# Patient Record
Sex: Male | Born: 1960 | Race: White | Hispanic: No | Marital: Married | Smoking: Current some day smoker
Health system: Southern US, Community
[De-identification: ages and names within clinical notes are randomized; demographics above are authoritative.]

## PROBLEM LIST (undated history)

## (undated) DIAGNOSIS — M1990 Unspecified osteoarthritis, unspecified site: Secondary | ICD-10-CM

## (undated) DIAGNOSIS — R200 Anesthesia of skin: Secondary | ICD-10-CM

## (undated) DIAGNOSIS — K219 Gastro-esophageal reflux disease without esophagitis: Secondary | ICD-10-CM

## (undated) DIAGNOSIS — I2699 Other pulmonary embolism without acute cor pulmonale: Secondary | ICD-10-CM

## (undated) DIAGNOSIS — R634 Abnormal weight loss: Secondary | ICD-10-CM

## (undated) DIAGNOSIS — K6289 Other specified diseases of anus and rectum: Secondary | ICD-10-CM

## (undated) DIAGNOSIS — G4730 Sleep apnea, unspecified: Secondary | ICD-10-CM

## (undated) DIAGNOSIS — M7989 Other specified soft tissue disorders: Secondary | ICD-10-CM

## (undated) DIAGNOSIS — I10 Essential (primary) hypertension: Secondary | ICD-10-CM

## (undated) DIAGNOSIS — E041 Nontoxic single thyroid nodule: Secondary | ICD-10-CM

## (undated) DIAGNOSIS — K859 Acute pancreatitis without necrosis or infection, unspecified: Principal | ICD-10-CM

## (undated) DIAGNOSIS — R51 Headache: Secondary | ICD-10-CM

## (undated) DIAGNOSIS — A0472 Enterocolitis due to Clostridium difficile, not specified as recurrent: Secondary | ICD-10-CM

## (undated) DIAGNOSIS — D649 Anemia, unspecified: Secondary | ICD-10-CM

## (undated) DIAGNOSIS — R238 Other skin changes: Secondary | ICD-10-CM

## (undated) DIAGNOSIS — R002 Palpitations: Secondary | ICD-10-CM

## (undated) DIAGNOSIS — D6851 Activated protein C resistance: Secondary | ICD-10-CM

## (undated) DIAGNOSIS — N411 Chronic prostatitis: Secondary | ICD-10-CM

## (undated) DIAGNOSIS — R233 Spontaneous ecchymoses: Secondary | ICD-10-CM

## (undated) DIAGNOSIS — R9431 Abnormal electrocardiogram [ECG] [EKG]: Secondary | ICD-10-CM

## (undated) DIAGNOSIS — N400 Enlarged prostate without lower urinary tract symptoms: Secondary | ICD-10-CM

## (undated) DIAGNOSIS — J189 Pneumonia, unspecified organism: Secondary | ICD-10-CM

## (undated) DIAGNOSIS — Z79899 Other long term (current) drug therapy: Secondary | ICD-10-CM

## (undated) DIAGNOSIS — R202 Paresthesia of skin: Secondary | ICD-10-CM

## (undated) HISTORY — DX: Nontoxic single thyroid nodule: E04.1

## (undated) HISTORY — DX: Enterocolitis due to Clostridium difficile, not specified as recurrent: A04.72

## (undated) HISTORY — DX: Acute pancreatitis without necrosis or infection, unspecified: K85.90

## (undated) HISTORY — DX: Abnormal weight loss: R63.4

## (undated) HISTORY — DX: Essential (primary) hypertension: I10

## (undated) HISTORY — DX: Other specified diseases of anus and rectum: K62.89

## (undated) HISTORY — DX: Other pulmonary embolism without acute cor pulmonale: I26.99

## (undated) HISTORY — DX: Headache: R51

## (undated) HISTORY — DX: Gastro-esophageal reflux disease without esophagitis: K21.9

## (undated) HISTORY — DX: Unspecified osteoarthritis, unspecified site: M19.90

## (undated) HISTORY — DX: Activated protein C resistance: D68.51

## (undated) HISTORY — DX: Spontaneous ecchymoses: R23.3

## (undated) HISTORY — DX: Other skin changes: R23.8

## (undated) HISTORY — DX: Other long term (current) drug therapy: Z79.899

## (undated) HISTORY — DX: Chronic prostatitis: N41.1

## (undated) HISTORY — DX: Other specified soft tissue disorders: M79.89

## (undated) HISTORY — DX: Benign prostatic hyperplasia without lower urinary tract symptoms: N40.0

## (undated) HISTORY — PX: FEMUR FRACTURE SURGERY: SHX633

## (undated) HISTORY — DX: Palpitations: R00.2

---

## 1979-07-02 HISTORY — PX: NERVE SURGERY: SHX1016

## 1979-07-02 HISTORY — PX: KNEE SURGERY: SHX244

## 1997-10-31 HISTORY — PX: NECK SURGERY: SHX720

## 2006-12-25 ENCOUNTER — Emergency Department (HOSPITAL_COMMUNITY): Admission: EM | Admit: 2006-12-25 | Discharge: 2006-12-25 | Payer: Self-pay | Admitting: Emergency Medicine

## 2010-12-30 DIAGNOSIS — D6851 Activated protein C resistance: Secondary | ICD-10-CM

## 2010-12-30 DIAGNOSIS — I2699 Other pulmonary embolism without acute cor pulmonale: Secondary | ICD-10-CM

## 2010-12-30 HISTORY — DX: Activated protein C resistance: D68.51

## 2010-12-30 HISTORY — DX: Other pulmonary embolism without acute cor pulmonale: I26.99

## 2011-01-30 HISTORY — PX: ESOPHAGOGASTRODUODENOSCOPY: SHX1529

## 2011-01-30 HISTORY — PX: COLONOSCOPY: SHX174

## 2011-04-06 ENCOUNTER — Encounter: Payer: Self-pay | Admitting: Internal Medicine

## 2011-04-18 ENCOUNTER — Encounter: Payer: Self-pay | Admitting: Gastroenterology

## 2011-04-18 ENCOUNTER — Ambulatory Visit (INDEPENDENT_AMBULATORY_CARE_PROVIDER_SITE_OTHER): Payer: Self-pay | Admitting: Gastroenterology

## 2011-04-18 VITALS — BP 142/88 | HR 92 | Temp 98.4°F | Ht 72.0 in | Wt 176.8 lb

## 2011-04-18 DIAGNOSIS — K5900 Constipation, unspecified: Secondary | ICD-10-CM | POA: Insufficient documentation

## 2011-04-18 DIAGNOSIS — R109 Unspecified abdominal pain: Secondary | ICD-10-CM | POA: Insufficient documentation

## 2011-04-18 DIAGNOSIS — K219 Gastro-esophageal reflux disease without esophagitis: Secondary | ICD-10-CM | POA: Insufficient documentation

## 2011-04-18 DIAGNOSIS — R1310 Dysphagia, unspecified: Secondary | ICD-10-CM | POA: Insufficient documentation

## 2011-04-18 DIAGNOSIS — K625 Hemorrhage of anus and rectum: Secondary | ICD-10-CM

## 2011-04-18 DIAGNOSIS — G8929 Other chronic pain: Secondary | ICD-10-CM

## 2011-04-18 MED ORDER — ESOMEPRAZOLE MAGNESIUM 40 MG PO CPDR: 40.0000 mg | DELAYED_RELEASE_CAPSULE | Freq: Every day | ORAL | Status: DC

## 2011-04-18 NOTE — Progress Notes (Signed)
Cc to PCP 

## 2011-04-18 NOTE — Assessment & Plan Note (Signed)
No recent rectal bleeding. History of hemorrhoids on colonoscopy in April. No evidence of significant anemia. EGD and remarkable as well.Monitor for now.

## 2011-04-18 NOTE — Progress Notes (Signed)
Primary Care Physician:  [name], MD  Primary Gastroenterologist:  Roetta Sessions, MD  Chief Complaint  Patient presents with  . Gastrophageal Reflux  . Abdominal Pain    HPI:  Alex Hoffman is a 50 y.o. male here for further evaluation of GERD, abdominal pain. His history is very complicated. Essentially he complains of chronic abdominal pain for over one year. He states he went to [facility] hospital about one year ago and had a CT. Nothing to explain his abdominal pain. See records below. He complains of intermittent severe abdominal pain associated with vomiting. He also complains of rectal bleeding of the same length of duration. Rectal bleeding about one year ago and back in 12/2010 while in hospital for PE. He states he was told he had acute prostatitis and has been on Cipro for 6 weeks without any improvement of his lower abdominal pain, dysuria or pain he has with bowel movements. Stomach stays swollen all the time. Pain with BM, ache in groin and lower abdominal. Due to lack of insurance he is has a pending urology Appt at [facility] (patient assistance program).  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 He presented with swelling and pain of the left arm and left leg. He was also having some diarrhea was diagnosed with C. difficile and started on Flagyl. EGD by Dr. Cristy Folks was unremarkable. Colonoscopy unremarkable. No evidence of C. difficile on exam. Patient had positive C. difficile PCR and was treated with Flagyl.  Bad H/As, debilitating, waiting to see neurologist at [facility]. No insurance and they will provide financial assistance.  Not able to drive since 03/2129.   On Cipro for prostatitis.  Also complains of waking up in the middle of the night choking. Sometimes he feels like he forgets how to swallow. He had a modified barium swallow study as outlined below.   Records from [facility]: 1. MRI/MRA of the head 03/08/2011  showed small left maxillary sinus mucous retention cyst and mild maxillary sinus mucosal thickening but otherwise normal appearance of the brain. Normal intracranial MRV. 2. MRI of the cervical spine on 03/08/2011 showed postoperative changes with C5-C6 fusion, mild bulging annulus and a shallow disc protrusion at C4-C5 without significant neural compression. 3. CT of the head without contrast on 03/08/2011 was unremarkable. 4. Labs from May 2012 hospitalization, glucose 111, BUN 10, creatinine 0.84, sodium 140, potassium 3.4, calcium 9.1, white blood cell count 6500, hemoglobin 13.2, hematocrit 30.3, MCV 96.2, platelets 306,000. 5. CT angiography of the chest in March 2012 showed bilateral pulmonary emboli with relatively mild clot burden. 6. EGD by Dr. Mikal Plane on 02/01/2011 showed bile reflux in the stomach but otherwise unremarkable. 7. colonoscopy by Dr. Mikal Plane on 02/04/2011 showed internal hemorrhoids but no evidence of pseudomembranous colitis. He had positive C. difficile PCR. 8. modified barium swallow study done on 04/05/2011 was unremarkable. Given patient's description they were concerned about possibility of LPR.  CT pelvis with contrast from July 2011 showed 1.5 x 2.5 cm vague area of decreased attenuation within the inferior right liver.  Current Outpatient Prescriptions  Medication Sig Dispense Refill  . amLODipine (NORVASC) 10 MG tablet Take 10 mg by mouth daily.        . ciprofloxacin (CIPRO) 500 MG tablet Take 500 mg by mouth 2 (two) times daily.        . nicotine (NICODERM CQ - DOSED IN MG/24 HOURS) 21 mg/24hr patch Place  1 patch onto the skin daily.        Marland Kitchen tiZANidine (ZANAFLEX) 4 MG capsule Take 4 mg by mouth 3 (three) times daily.        Marland Kitchen warfarin (COUMADIN) 7.5 MG tablet Take 7.5 mg by mouth daily. Takes 1/2 pill one day then 1 whole pill then next day       . DISCONTD: omeprazole (PRILOSEC OTC) 20 MG tablet Take 20 mg by mouth 2 (two) times daily.        Marland Kitchen  esomeprazole (NEXIUM) 40 MG capsule Take 1 capsule (40 mg total) by mouth daily before breakfast.  20 capsule  0    Allergies as of 04/18/2011  . (No Known Allergies)    Past Medical History  Diagnosis Date  . Pulmonary embolism   . Cystic thyroid nodule     left  . HTN (hypertension)   . Heterozygous factor V Leiden mutation     Past Surgical History  Procedure Date  . Neck surgery     C5/C6 fusion  . Knee surgery     arthroscopy, left knee  . Femur fracture surgery   . Nerve surgery     right thumb    Family History  Problem Relation Age of Onset  . GI problems Brother   . Colon cancer Neg Hx   . Liver disease Neg Hx   . GI problems      stomach and lung cancer, aunts/uncles  . Heart attack Brother     before age of 56  . Stroke Brother     History   Social History  . Marital Status: Divorced    Spouse Name: N/A    Number of Children: 2  . Years of Education: N/A   Occupational History  .      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 Smoking status: Former Smoker -- 1.0 packs/day    Types: Cigarettes    Quit date: 01/27/2011  . Smokeless tobacco: Not on file   Comment: on the patch now  . Alcohol Use: No  . Drug Use: No  . Sexually Active: Not on file   Other Topics Concern  . Not on file   Social History Narrative  . No narrative on file      ROS:  General: Negative for anorexia, fever, chills. Complains of 30 pound weight loss, fatigue, weakness. Eyes: Negative for vision changes.  ENT: Negative for hoarseness, difficulty swallowing , nasal congestion. CV: Negative for chest pain, angina, palpitations, dyspnea on exertion, peripheral edema.  Respiratory: Negative for dyspnea at rest, dyspnea on exertion, cough, sputum, wheezing.  GI: See history of present illness. GU: Complains of dysuria. Negative for hematuria, urinary incontinence, urinary frequency, nocturnal  urination.  MS: Negative for joint pain, low back pain.  Derm: Negative for rash or itching.  Neuro: Negative for weakness, abnormal sensation, seizure, memory loss, confusion. Complains of frequent headache.  Psych: Negative for anxiety, depression, suicidal ideation, hallucinations.  Endo: Negative for unusual weight change.  Heme: Negative for bruising or bleeding. Allergy: Negative for rash or hives.    Physical Examination:  BP 142/88  Pulse 92  Temp(Src) 98.4 F (36.9 C) (Temporal)  Ht 6' (1.829 m)  Wt 176 lb 12.8 oz (80.196 kg)  BMI 23.98 kg/m2   General: Well-nourished, well-developed in no acute distress.  Head: Normocephalic, atraumatic.  Eyes: Conjunctiva pink, no icterus. Mouth: Oropharyngeal mucosa moist and pink , no lesions erythema or exudate. Neck: Supple without thyromegaly, masses, or lymphadenopathy.  Lungs: Clear to auscultation bilaterally.  Heart: Regular rate and rhythm, no murmurs rubs or gallops.  Abdomen: Bowel sounds are normal, nondistended, no hepatosplenomegaly or masses, no abdominal bruits or hernia. Diffuse abdominal tenderness to deep palpation with subjective guarding but no rebound.   Extremities: No lower extremity edema.  Neuro: Alert and oriented x 4 , grossly normal neurologically.  Skin: Warm and dry, no rash or jaundice.   Psych: Alert and cooperative, normal mood and affect.

## 2011-04-18 NOTE — Assessment & Plan Note (Signed)
Diffuse abdominal pain, chronic in nature and associated with vomiting and rectal bleeding at times. C. difficile PCR was positive and patient took Flagyl although colonoscopy was unremarkable Dr. Cristy Folks. He has a history of heterozygous factor V mutation and is now on anticoagulation. He is quite tender on exam today. Proceed with CT the abdomen pelvis with IV and oral contrast. I do not see anywhere where he had his liver function test done. We'll check those. Also check CBC and lipase.

## 2011-04-18 NOTE — Assessment & Plan Note (Signed)
Chronic abdominal pain associated with intermittent vomiting. Complains of waking up in the middle of the night choking. Complains of difficulty remembering how to swallow. Recent modified barium swallow was unremarkable. Really does not describe typical esophageal dysphagia. He does not appreciating any improvement on omeprazole 20 mg daily. This may be atypical presentation of GERD. Trial of Nexium 40 mg daily. #20 samples provided. EGD by Dr. Cristy Folks in April was unremarkable.

## 2011-04-18 NOTE — Progress Notes (Signed)
PT is scheduled for CT on 04/20/11- He is aware to have lab work prior

## 2011-04-18 NOTE — Assessment & Plan Note (Signed)
Refer to GERD assessment and plan.

## 2011-04-20 ENCOUNTER — Other Ambulatory Visit (HOSPITAL_COMMUNITY): Payer: Self-pay

## 2011-04-25 ENCOUNTER — Other Ambulatory Visit (HOSPITAL_COMMUNITY): Payer: Self-pay

## 2011-04-25 ENCOUNTER — Telehealth: Payer: Self-pay

## 2011-04-25 LAB — COMPREHENSIVE METABOLIC PANEL
AST: 56 U/L — ABNORMAL HIGH (ref 0–37)
CO2: 29 mEq/L (ref 19–32)
Creat: 0.64 mg/dL (ref 0.50–1.35)
Total Bilirubin: 1.4 mg/dL — ABNORMAL HIGH (ref 0.3–1.2)
Total Protein: 6.8 g/dL (ref 6.0–8.3)

## 2011-04-25 LAB — CBC WITH DIFFERENTIAL/PLATELET
Basophils Absolute: 0 10*3/uL (ref 0.0–0.1)
HCT: 36.9 % — ABNORMAL LOW (ref 39.0–52.0)
Hemoglobin: 12.5 g/dL — ABNORMAL LOW (ref 13.0–17.0)
Lymphocytes Relative: 34 % (ref 12–46)
Monocytes Absolute: 0.6 10*3/uL (ref 0.1–1.0)
Monocytes Relative: 9 % (ref 3–12)
Platelets: 379 10*3/uL (ref 150–400)
RBC: 3.93 MIL/uL — ABNORMAL LOW (ref 4.22–5.81)

## 2011-04-25 NOTE — Telephone Encounter (Signed)
Pt called- he had his blood work done this morning and would like to be rescheduled for his ct.

## 2011-04-26 NOTE — Telephone Encounter (Signed)
Pt scheduled for ct scan on 04/28/2011@2 :30 with an arrival time at 12:00noon time. He will drink his contrast at [facility].

## 2011-04-28 ENCOUNTER — Inpatient Hospital Stay (HOSPITAL_COMMUNITY): Admission: RE | Admit: 2011-04-28 | Payer: Self-pay | Source: Ambulatory Visit

## 2011-05-01 DIAGNOSIS — K859 Acute pancreatitis without necrosis or infection, unspecified: Secondary | ICD-10-CM

## 2011-05-01 HISTORY — DX: Acute pancreatitis without necrosis or infection, unspecified: K85.90

## 2011-05-02 ENCOUNTER — Ambulatory Visit (HOSPITAL_COMMUNITY)
Admission: RE | Admit: 2011-05-02 | Discharge: 2011-05-02 | Disposition: A | Discharge status: Home / Self Care | Payer: Self-pay | Source: Ambulatory Visit | Attending: Gastroenterology | Admitting: Gastroenterology

## 2011-05-02 DIAGNOSIS — K625 Hemorrhage of anus and rectum: Secondary | ICD-10-CM

## 2011-05-02 DIAGNOSIS — I10 Essential (primary) hypertension: Secondary | ICD-10-CM | POA: Insufficient documentation

## 2011-05-02 DIAGNOSIS — R109 Unspecified abdominal pain: Secondary | ICD-10-CM | POA: Insufficient documentation

## 2011-05-02 MED ORDER — IOHEXOL 300 MG/ML  SOLN
100.0000 mL | Freq: Once | INTRAMUSCULAR | Status: AC | PRN
  Administered 2011-05-02: 100 mL via INTRAVENOUS

## 2011-05-02 NOTE — Progress Notes (Signed)
Pt was rescheduled for today- 05/02/11

## 2011-05-05 ENCOUNTER — Other Ambulatory Visit: Payer: Self-pay | Admitting: Gastroenterology

## 2011-05-09 ENCOUNTER — Encounter: Payer: Self-pay | Admitting: General Practice

## 2011-05-09 NOTE — Progress Notes (Signed)
We received a phone call from Dr Orvilla Cornwall office.  They wanted to inform us that the pt is an inpatient at [facility] in [place].  They will have them send correspondents to our office.

## 2011-05-11 ENCOUNTER — Telehealth: Payer: Self-pay

## 2011-05-11 NOTE — Telephone Encounter (Signed)
We don't have the authority to do that. He needs to discuss with his attending physician at [facility].

## 2011-05-11 NOTE — Telephone Encounter (Signed)
pts wife called. Pt is inpatient at [facility] and wants to know if there is anyway we can get him transferred to [facility].

## 2011-05-12 NOTE — Telephone Encounter (Signed)
Pt aware, he stated he thought they were considering transferring him to [facility].

## 2011-05-16 NOTE — Progress Notes (Signed)
Records are on your desk

## 2011-05-17 NOTE — Progress Notes (Signed)
Patient called about trying to get help with medication (Nexium 40 mg QD). I told him we would give sample until the nurse came back in to fill out paper work. Left 2 boxes of Nexium 40 mg samples up front for him to pick up on Wednesday.

## 2011-05-19 ENCOUNTER — Telehealth: Payer: Self-pay | Admitting: Gastroenterology

## 2011-05-19 ENCOUNTER — Encounter: Payer: Self-pay | Admitting: Gastroenterology

## 2011-05-19 NOTE — Telephone Encounter (Signed)
Patient's wife called and said Dr.Karb would like for [facility] or Dr.Rourk to call him to talk about what they need to do about the test. His number is [phone].

## 2011-05-19 NOTE — Progress Notes (Signed)
Records received

## 2011-05-19 NOTE — Progress Notes (Signed)
  Received records from [facility]. Reviewed below.  1. Admission from July 6 through 05/13/2011 for acute pancreatitis.  2. Lipase on admission was 301. Total bilirubin 1.4, alkaline phosphatase 138, AST 22, ALT 20, amylase 229. On July 12 alkaline phosphatase was 139, AST 34, ALT 17, total bilirubin 0.5, amylase 51, lipase 60.  3. Hepatobiliary scan, 05/12/2011, demonstration of patency of the common bile duct and the cystic duct. No evidence of acute cholecystitis. There was delayed visualization of the gallbladder consistent with element of chronic cholecystitis.  4. MRCP, 05/09/2011, gallbladder minimally distended at 4 cm. Common bile duct mildly dilated at 7 mm. Small fluid collection anterior to the pancreas measuring 1.7 x 0.9 cm, 3 filling defects which measure approximately 4 mm within the distal pancreatic duct felt to likely represent concretions collecting in the distal duct pancreatic duct from chronic pancreatitis. No evidence of stones within the common bile duct. No evidence of acute cholecystitis.  5. Abdominal ultrasound from 05/08/2011 showed common bile duct diameter of 9.7 mm, distended gallbladder without gallstones or wall thickening. Positive sonographic Murphy's sign.  6. Discharge medications including methadone 10 mg twice a day, Valium 5 mg each bedtime, oxycodone 15 mg 4 times a day, Coumadin, Norvasc, Zanaflex, nicotine patch  7. He is scheduled to have an endoscopic ultrasound at [facility] on October 29 at 1 PM.  8. Seen by Dr. Josephina Shih in consultation, flexible fiberoptic laryngoscopy was performed and was normal.  Patient to be scheduled for followup to discuss next step in workup.

## 2011-05-19 NOTE — Telephone Encounter (Signed)
APPT SCED FOR 05/24/11 AT 9:00 W/LSL

## 2011-05-19 NOTE — Telephone Encounter (Signed)
Patient needs followup office visit to discuss further management. Recent hospitalization for pancreatitis at [facility]. Please schedule within the next one to 2 weeks.

## 2011-05-19 NOTE — Telephone Encounter (Signed)
Spoke with Dr. Cleone Slim. Will review MRCP films and findings with Dr. Jena Gauss and radiologist tomorrow. Will call Dr. Cleone Slim on Monday as discussed, to address patient's plan of care.

## 2011-05-20 NOTE — Telephone Encounter (Signed)
Reviewed MRCP films with Dr. Tyron Russell. Three small ?stones in distal pancreatic duct proximal to ampulla. No CBD stones. Discussed with Dr. Jena Gauss. As noted by him, I will talk with Dr. Cleone Slim on Monday and discuss that patient needs EUS before consideration of any potential ERCP. If ERCP needed for pancreatic duct abnormalities, this will need to be done by a pancreatic expert due to risk of serious complications.

## 2011-05-20 NOTE — Telephone Encounter (Signed)
Patient needs an endoscopic ultrasound before consideration of any potential ERCP. If ERCP were to be needed for pancreatic duct abnormalities, this would need to be done by a pancreatic expert at a tertiary referral center.

## 2011-05-23 ENCOUNTER — Telehealth: Payer: Self-pay

## 2011-05-23 DIAGNOSIS — R933 Abnormal findings on diagnostic imaging of other parts of digestive tract: Secondary | ICD-10-CM

## 2011-05-23 LAB — COMPREHENSIVE METABOLIC PANEL
Hemoglobin: 12.4 g/dL — AB (ref 13.5–17.5)
MCV: 95.9 fL
WBC: 7
platelet count: 595

## 2011-05-23 LAB — BASIC METABOLIC PANEL
Amylase: 39 units/L (ref 25–110)
BUN: 9 mg/dL (ref 4–21)
Creat: 0.92
Total Bilirubin: 0.5 mg/dL

## 2011-05-23 NOTE — Telephone Encounter (Signed)
Spoke with Dr. Cleone Slim. He will be seeing the patient this afternoon.   Dr. Cleone Slim going to send patient to Dr. Wendall Papa for EUS.  Patient has appointment here tomorrow, unclear if he plans to keep.

## 2011-05-24 ENCOUNTER — Ambulatory Visit (INDEPENDENT_AMBULATORY_CARE_PROVIDER_SITE_OTHER): Payer: Self-pay | Admitting: Gastroenterology

## 2011-05-24 ENCOUNTER — Encounter: Payer: Self-pay | Admitting: Gastroenterology

## 2011-05-24 ENCOUNTER — Other Ambulatory Visit: Payer: Self-pay | Admitting: Gastroenterology

## 2011-05-24 VITALS — BP 115/75 | HR 66 | Temp 97.4°F | Ht 72.0 in | Wt 172.0 lb

## 2011-05-24 DIAGNOSIS — K859 Acute pancreatitis without necrosis or infection, unspecified: Secondary | ICD-10-CM | POA: Insufficient documentation

## 2011-05-24 DIAGNOSIS — R1310 Dysphagia, unspecified: Secondary | ICD-10-CM

## 2011-05-24 DIAGNOSIS — R319 Hematuria, unspecified: Secondary | ICD-10-CM

## 2011-05-24 DIAGNOSIS — R7989 Other specified abnormal findings of blood chemistry: Secondary | ICD-10-CM | POA: Insufficient documentation

## 2011-05-24 DIAGNOSIS — R945 Abnormal results of liver function studies: Secondary | ICD-10-CM

## 2011-05-24 DIAGNOSIS — R310 Gross hematuria: Secondary | ICD-10-CM

## 2011-05-24 DIAGNOSIS — K219 Gastro-esophageal reflux disease without esophagitis: Secondary | ICD-10-CM

## 2011-05-24 MED ORDER — PANCRELIPASE (LIP-PROT-AMYL) 20000-68000 UNITS PO CPEP: ORAL_CAPSULE | ORAL | Status: DC

## 2011-05-24 NOTE — Telephone Encounter (Signed)
Pt returned call and is aware of the instructions meds reviewed and he will call with any questions

## 2011-05-24 NOTE — Progress Notes (Signed)
Primary Care Physician: [name], MD  Primary Gastroenterologist:  Roetta Sessions, MD  Hematologist: Isabel Caprice, MD  Chief Complaint  Patient presents with  . Pancreatitis    HPI: Alex Hoffman is a 50 y.o. male here for followup of pancreatitis. He was initially seen back in June. Refer to office visit from 04/18/2011. After last office visit lab work showed abnormal alkaline phosphatase, AST, ALT. Lipase was normal. He had a CT of abdomen and pelvis for abdominal pain on 05/02/2011 at our facilities, [facility], which was unremarkable. However on 05/06/2011 he presented with worsening abdominal pain. He went to [facility] where he was hospitalized for 7 days. He was diagnosed with acute pancreatitis. At that time his lipase was over 300. Interestingly however his AST and ALT were normal. Alkaline phosphatase was slightly elevated at 138. When he was seen in our office he denied alcohol use. He states he drinks wine coolers 1-2 at a time several days a week but denies drinking any for about 2 weeks prior to his hospitalization for pancreatitis.   Abdominal ultrasound done during hospitalization showed no evidence of gallstones or acute cholecystitis, common bowel duct diameter was 9.7 mm. MRCP showed acute pancreatitis. There was a small fluid collection anterior to the pancreas measuring 1.7 x 0.9 cm, small amount of fluid adjacent to the pancreatic tail, within the distal pancreatic duct just proximal to the ampulla were 3 filling defects which measure approximately 4 mm each. The duct is dilated around these filling defects and there is ductal ectasia and edema in the pancreatic head. No choledocholithiasis. HIDA scan showed no evidence of acute cholecystitis but suggestive of chronic cholecystitis given delayed visualization of the gallbladder.    He was also evaluated by ENT, Dr. Josephina Shih for ongoing complaint of neck pain, hoarseness. It was felt that his  chronic neck pain was probably musculoskeletal related to cervical spine disease. Flexible fiberoptic laryngoscopy was normal.   He presents today. I spoke with Dr. Cleone Slim yesterday regarding the patient and he arranged for him to have an endoscopic ultrasound I will Dr. Rob Bunting in the near future. There were some concerns that he may need an ERCP for the pancreatic duct findings on recent MRCP however after review of films and discussion with Dr. Jena Gauss, endoscopic ultrasound was recommended. If it is felt that the pancreatic duct needs to be cannulated this should be done by a pancreatic expert and probably at a tertiary care center given the risk of significant complications.   Patient states that his abdominal pain has improved but he still on a full liquid diet. If he strays too much past that he has recurrent abdominal pain.  Some nausea but no vomiting. BM okay. Urine is "brick red". BM, little blood, infrequent. Complains of ongoing hoarseness, neck pain, inability to swallow at times, "forgets" how to swallow at times.   Current Outpatient Prescriptions  Medication Sig Dispense Refill  . amitriptyline (ELAVIL) 25 MG tablet Take 25 mg by mouth at bedtime.        Marland Kitchen amLODipine (NORVASC) 10 MG tablet Take 10 mg by mouth daily.        Marland Kitchen esomeprazole (NEXIUM) 40 MG capsule Take 1 capsule (40 mg total) by mouth daily before breakfast.  20 capsule  0  . methadone (DOLOPHINE) 10 MG tablet Take 10 mg by mouth 2 (two) times daily. 2 pills Bid        . metoCLOPramide (REGLAN) 10 MG tablet  Take 10 mg by mouth daily.        . nicotine (NICODERM CQ - DOSED IN MG/24 HOURS) 21 mg/24hr patch Place 1 patch onto the skin daily.        Marland Kitchen oxyCODONE (OXYCONTIN) 15 MG TB12 Take 15 mg by mouth every 12 (twelve) hours.        Marland Kitchen warfarin (COUMADIN) 7.5 MG tablet Take 7.5 mg by mouth daily. Takes 1.5 pill one day then 2 whole pill then next day      .         Marland Kitchen Pancrelipase, Lip-Prot-Amyl, (ZENPEP) 20000 UNITS  CPEP Take 2 with meals and one with snacks.  112 capsule  0  . tiZANidine (ZANAFLEX) 4 MG capsule Take 4 mg by mouth 3 (three) times daily.          Allergies as of 05/24/2011  . (No Known Allergies)    ROS:  General: Negative for fever, chills, fatigue, weakness. Weight down four pounds since 04/2011. ENT: See HPI. CV: Negative for chest pain, angina, palpitations, dyspnea on exertion, peripheral edema.  Respiratory: Negative for dyspnea at rest, dyspnea on exertion, cough, sputum, wheezing.  GI: See history of present illness. GU:  Negative for dysuria, urinary incontinence, urinary frequency, nocturnal urination. See HPI.  Endo: Negative for unusual weight change.    Physical Examination:   BP 115/75  Pulse 66  Temp(Src) 97.4 F (36.3 C) (Temporal)  Ht 6' (1.829 m)  Wt 172 lb (78.019 kg)  BMI 23.33 kg/m2  General: Well-nourished, well-developed in no acute distress. Accompanied by girlfriend, [name]. Eyes: No icterus. Mouth: Oropharyngeal mucosa moist and pink , no lesions erythema or exudate. Lungs: Clear to auscultation bilaterally.  Heart: Regular rate and rhythm, no murmurs rubs or gallops.  Abdomen: Bowel sounds are normal, nondistended, no hepatosplenomegaly or masses, no abdominal bruits or hernia. Diffuse tenderness to light palpation without no rebound or guarding.   Extremities: No lower extremity edema.  Neuro: Alert and oriented x 4   Skin: Warm and dry, no jaundice.   Psych: Alert and cooperative, normal mood and affect.  Labs:   Recent Results (from the past 1344 hour(s))  LIPASE   Collection Time   04/18/11  1:45 PM      Component Value Range   Lipase 22  0 - 75 (U/L)  COMPREHENSIVE METABOLIC PANEL   Collection Time   04/18/11  1:45 PM      Component Value Range   Sodium 140  135 - 145 (mEq/L)   Potassium 4.2  3.5 - 5.3 (mEq/L)   Chloride 101  96 - 112 (mEq/L)   CO2 29  19 - 32 (mEq/L)   Glucose, Bld 84  70 - 99 (mg/dL)   BUN 9  6 - 23 (mg/dL)    Creat 1.61  0.96 - 1.35 (mg/dL)   Total Bilirubin 1.4 (*) 0.3 - 1.2 (mg/dL)   Alkaline Phosphatase 153 (*) 39 - 117 (U/L)   AST 56 (*) 0 - 37 (U/L)   ALT 60 (*) 0 - 53 (U/L)   Total Protein 6.8  6.0 - 8.3 (g/dL)   Albumin 4.5  3.5 - 5.2 (g/dL)   Calcium 9.8  8.4 - 04.5 (mg/dL)  CBC WITH DIFFERENTIAL   Collection Time   04/18/11  1:45 PM      Component Value Range   WBC 6.4  4.0 - 10.5 (K/uL)   RBC 3.93 (*) 4.22 - 5.81 (MIL/uL)   Hemoglobin 12.5 (*)  13.0 - 17.0 (g/dL)   HCT 16.1 (*) 09.6 - 52.0 (%)   MCV 93.9  78.0 - 100.0 (fL)   MCH 31.8  26.0 - 34.0 (pg)   MCHC 33.9  30.0 - 36.0 (g/dL)   RDW 04.5  40.9 - 81.1 (%)   Platelets 379  150 - 400 (K/uL)   Neutrophils Relative 55  43 - 77 (%)   Neutro Abs 3.5  1.7 - 7.7 (K/uL)   Lymphocytes Relative 34  12 - 46 (%)   Lymphs Abs 2.2  0.7 - 4.0 (K/uL)   Monocytes Relative 9  3 - 12 (%)   Monocytes Absolute 0.6  0.1 - 1.0 (K/uL)   Eosinophils Relative 2  0 - 5 (%)   Eosinophils Absolute 0.1  0.0 - 0.7 (K/uL)   Basophils Relative 0  0 - 1 (%)   Basophils Absolute 0.0  0.0 - 0.1 (K/uL)   Smear Review Criteria for review not met     Labs done at [facility], 05/23/2011 BUN 9, creatinine 0.92, total bilirubin 0.5, alkaline phosphatase 233, AST 59, ALT 50, albumin 4.3, amylase 39, lipase 38, INR 1.5   Imaging Studies:  As mentioned above.

## 2011-05-24 NOTE — Telephone Encounter (Addendum)
Per Dr Christella Hartigan pt scheduled for EUS.  Dr Margo Common called Dr Christella Hartigan during clinic on 05/23/11 and requested pt procedure.  Per Dr Cleone Slim pt is to hold coumadin 5 days prior contact name for pt is Alex Hoffman [phone].  06/09/11 1015 am appt time need to review meds and instruct pt.  Left message on machine to call back

## 2011-05-24 NOTE — Progress Notes (Signed)
Pt had appt today.

## 2011-05-24 NOTE — Assessment & Plan Note (Signed)
Pancreatitis requiring recent hospitalization. There some question about possibility of chronic pancreatitis with abnormality seen within the pancreatic duct possibly concretions. Notably patient had normal lipase but elevated alkaline phosphatase, AST, ALT on June 18 and unremarkable CT the abdomen pelvis here at our facility on July 2. He presented 4 days later to [facility] on July where he was found to have a lipase of over 300, elevated alkaline phosphatase but normal AST and ALT. MRCP findings showed possible concretions within the pancreatic duct as well as some fluid collections around the pancreas and acute pancreatitis. No choledocholithiasis or cholelithiasis seen. No acute cholecystitis.  He will continue full liquids for now. Add Zenpep 20,000, 2 with meals and one with snacks. #112 samples provided. Patient assistance forms to be provided if help available from the company. He'll continue Nexium as before.  Patient is scheduled to have endoscopic ultrasound Dr. Rob Bunting next month. He has concerns about adequate sedation given history of inadequate on to sedation at time of EGD, received fentanyl 100mg  and Versed 8 mg and states that he was awake for the entire exam. He is on chronic narcotics her contacts and recently started on methadone. We'll pass this information onto Dr. Rob Bunting office.

## 2011-05-24 NOTE — Assessment & Plan Note (Signed)
Prior EGD as outlined above. He's also had fiberoptic laryngoscopy by Dr. Andrey Campanile which was unremarkable. Reevaluate after workup for pancreatitic abnormalities has been completed.

## 2011-05-24 NOTE — Assessment & Plan Note (Signed)
Unclear etiology, may be related to his pancreatitis and/or alcohol consumption. Previous labs ordered have not been done which include iron and TIBC, ferritin, viral markers. Patient to have labs done today.

## 2011-05-24 NOTE — Telephone Encounter (Signed)
  PT APPT CHANGED TO ADD PROPOFOL AND BRIAN IS AWARE ALSO LESLIE NOTIFIED AND PHONE CALL OUT TO PT.  NEW INSTRUCTIONS PRINTED AND MAILED.    Please put him on for propofol sedation. Thanks   dj ----- Message ----- From: Chales Abrahams, CMA Sent: 05/24/2011 2:06 PM To: Rob Bunting, MD, Tana Coast, PA Subject: RE: eus   He does use propofol but he did not schedule this pt for it. I can let him know of the pt concerns and add it if he orders it. ----- Message ----- From: Tana Coast, PA Sent: 05/24/2011 12:22 PM To: Chales Abrahams, CMA, Tana Coast, PA Subject: eus   Alex Hoffman,   [facility] patient Alex Hoffman is schedule with Dr. Christella Hartigan for EUS in next couple of weeks. Patient has major concerns about sedation being used at time of EUS. He is on chronic pain medications and previously difficult to sedate at time of EGD/colonoscopy earlier in the year at outside hospital.   Do you know if Dr. Christella Hartigan uses conscious sedation or propofol, etc?   Just giving ya'll heads up.

## 2011-05-24 NOTE — Assessment & Plan Note (Signed)
Patient describes brick red urine. He is on Coumadin. Check urinalysis.

## 2011-05-24 NOTE — Assessment & Plan Note (Signed)
Continue Nexium. Samples provided. Patient assistance forms to be completed.

## 2011-05-25 ENCOUNTER — Encounter: Payer: Self-pay | Admitting: Internal Medicine

## 2011-05-25 LAB — URINALYSIS, ROUTINE W REFLEX MICROSCOPIC
Glucose, UA: NEGATIVE mg/dL
Hgb urine dipstick: NEGATIVE
Ketones, ur: NEGATIVE mg/dL
Nitrite: NEGATIVE
Protein, ur: NEGATIVE mg/dL
Specific Gravity, Urine: 1.013 (ref 1.005–1.030)
Urobilinogen, UA: 1 mg/dL (ref 0.0–1.0)
pH: 5.5 (ref 5.0–8.0)

## 2011-05-25 LAB — IRON AND TIBC
%SAT: 20 % (ref 20–55)
Iron: 75 ug/dL (ref 42–165)
TIBC: 369 ug/dL (ref 215–435)
UIBC: 294 ug/dL

## 2011-05-25 LAB — HEPATITIS C ANTIBODY: HCV Ab: NEGATIVE

## 2011-05-25 NOTE — Progress Notes (Signed)
Pt is aware of OV for 07/11/11 at 330 with RMR

## 2011-05-25 NOTE — Progress Notes (Signed)
Cc to PCP 

## 2011-06-09 ENCOUNTER — Ambulatory Visit (HOSPITAL_COMMUNITY)
Admission: RE | Admit: 2011-06-09 | Discharge: 2011-06-09 | Disposition: A | Discharge status: Home / Self Care | Payer: Self-pay | Source: Ambulatory Visit | Attending: Gastroenterology | Admitting: Gastroenterology

## 2011-06-09 ENCOUNTER — Encounter: Payer: Self-pay | Admitting: Gastroenterology

## 2011-06-09 DIAGNOSIS — R933 Abnormal findings on diagnostic imaging of other parts of digestive tract: Secondary | ICD-10-CM

## 2011-06-09 DIAGNOSIS — M545 Low back pain, unspecified: Secondary | ICD-10-CM | POA: Insufficient documentation

## 2011-06-09 DIAGNOSIS — D6859 Other primary thrombophilia: Secondary | ICD-10-CM | POA: Insufficient documentation

## 2011-06-09 DIAGNOSIS — K859 Acute pancreatitis without necrosis or infection, unspecified: Secondary | ICD-10-CM | POA: Insufficient documentation

## 2011-06-09 DIAGNOSIS — M542 Cervicalgia: Secondary | ICD-10-CM | POA: Insufficient documentation

## 2011-06-09 DIAGNOSIS — Z86718 Personal history of other venous thrombosis and embolism: Secondary | ICD-10-CM | POA: Insufficient documentation

## 2011-06-09 DIAGNOSIS — I10 Essential (primary) hypertension: Secondary | ICD-10-CM | POA: Insufficient documentation

## 2011-06-09 DIAGNOSIS — Z79899 Other long term (current) drug therapy: Secondary | ICD-10-CM | POA: Insufficient documentation

## 2011-06-14 ENCOUNTER — Telehealth: Payer: Self-pay | Admitting: Gastroenterology

## 2011-06-14 ENCOUNTER — Telehealth: Payer: Self-pay

## 2011-06-14 ENCOUNTER — Telehealth: Payer: Self-pay | Admitting: General Practice

## 2011-06-14 NOTE — Telephone Encounter (Signed)
I called Marchelle Folks at [facility] to discuss urgent referral for ERCP.

## 2011-06-14 NOTE — Telephone Encounter (Signed)
I spoke with Amanda,RN at [facility] and she instructed me to fax records and she would have Dr. Logan Bores review them and get back to me.

## 2011-06-14 NOTE — Telephone Encounter (Signed)
Message copied by Jennings Books on Tue Jun 14, 2011  8:43 AM ------      Message from: Ervin Knack A      Created: Tue Jun 14, 2011  8:04 AM                   ----- Message -----         From: Corbin Ade, MD         Sent: 06/10/2011   8:11 AM           To: Rob Bunting, MD, Glendora Score            Thank you.        We will get him over to see someone at       [facility]      ----- Message -----         From: Rob Bunting, MD         Sent: 06/09/2011  11:23 AM           To: Chales Abrahams, CMA, Corbin Ade, MD            I just completed upper EUS.  Report should be available in EPIC soon.              I think he needs referral to tertiary biliary ERCPist for CBD stone and 2-3 small pancreatic duct stones. The rest of pancreas looks ok, the peripancreatic fluid collections from acute pancreatitis have resolved.            Unless I hear otherwise, I will leave that ERCP referral to you if that is ok?            Thanks            dan

## 2011-06-14 NOTE — Telephone Encounter (Signed)
Records faxed to [facility] for ERCP referral

## 2011-06-14 NOTE — Telephone Encounter (Signed)
Message copied by Donata Duff on Tue Jun 14, 2011  1:07 PM ------      Message from: Rob Bunting P      Created: Thu Jun 09, 2011 11:23 AM       I just completed upper EUS.  Report should be available in EPIC soon.              I think he needs referral to tertiary biliary ERCPist for CBD stone and 2-3 small pancreatic duct stones. The rest of pancreas looks ok, the peripancreatic fluid collections from acute pancreatitis have resolved.            Unless I hear otherwise, I will leave that ERCP referral to you if that is ok?            Thanks            dan

## 2011-06-14 NOTE — Telephone Encounter (Signed)
Records faxed to [facility] 

## 2011-06-15 NOTE — Telephone Encounter (Signed)
I spoke with the pt and his fiance [name] [phone]) and explained that Amanda,Rn at [facility] stated Dr Logan Bores might do his procedure this Friday, August 17th but if he is having a lot of pain he can go to the er at [facility] for pain management until they get his procedure done.  Pt voiced understanding.

## 2011-06-16 ENCOUNTER — Encounter: Payer: Self-pay | Admitting: General Practice

## 2011-06-16 NOTE — Progress Notes (Signed)
I received a call from Allison at [facility]

## 2011-07-02 HISTORY — PX: ERCP: SHX5425

## 2011-07-11 ENCOUNTER — Ambulatory Visit: Payer: Self-pay | Admitting: Internal Medicine

## 2011-07-14 NOTE — Progress Notes (Signed)
  Did pt have his ERCP yet? Can we get copy of records.

## 2011-08-18 ENCOUNTER — Encounter: Payer: Self-pay | Admitting: Internal Medicine

## 2011-08-18 ENCOUNTER — Ambulatory Visit (INDEPENDENT_AMBULATORY_CARE_PROVIDER_SITE_OTHER): Payer: Self-pay | Admitting: Internal Medicine

## 2011-08-18 ENCOUNTER — Other Ambulatory Visit: Payer: Self-pay | Admitting: Internal Medicine

## 2011-08-18 VITALS — BP 128/82 | HR 72 | Temp 97.0°F | Ht 72.0 in | Wt 171.8 lb

## 2011-08-18 DIAGNOSIS — K859 Acute pancreatitis without necrosis or infection, unspecified: Secondary | ICD-10-CM

## 2011-08-18 DIAGNOSIS — R109 Unspecified abdominal pain: Secondary | ICD-10-CM

## 2011-08-18 DIAGNOSIS — G8929 Other chronic pain: Secondary | ICD-10-CM

## 2011-08-18 DIAGNOSIS — R197 Diarrhea, unspecified: Secondary | ICD-10-CM

## 2011-08-18 NOTE — Patient Instructions (Signed)
Stay on a clear liquid diet.  Get abdominal CT as ordered tomorrow morning.  CBC,  Chem-20, hepatic function profile,  amylase and lipase  Cdiff PCR

## 2011-08-18 NOTE — Progress Notes (Signed)
Primary Care Physician:  [name], MD Primary Gastroenterologist:  Dr.   Pre-Procedure History & Physical: HPI:  Alex Hoffman is a 50 y.o. male here for followup of pancreatitis. This gentleman  Has had significant abdominal pain since the ERP at [facility].  Had severe pain beginning September 11. He presented to [facility] ED; he was admitted- stayed  for 6 days and was discharged. He tells me he's had some difficulties making contact with Dr. Logan Bores over at  [facility] is "toughed it out". He says only on clear liquids wince procedure. He also relates recurrent mucousy nonbloody , incessant diarrhea; hx of Cdiff earkier in year.  Prior MRCP and  EUS demonstrated a dilated pancreatic duct with  2-3  3-4 mm filling defects in PD on EUS per Dr. Christella Hartigan . Also there was a filling defect in the common bile duct suggestive of choledocholithiasis. We sent him to [facility]. Records indicate the patient underwent ERP in September 2012.  A pancreatogram with pancreatic sphincterotomy was performed . PD stones removed; bile duct not entered per report.    Because of difficulties with guidewire access,  a prophylactic pancreatic duct stent was not placed.  He has had a nonspecific increase in LFTs over the recent past. He does relate fever and chills; hasn't had any jaundice clay-colored stools or dark-colored urine. He denies any alcohol abuse. No family history pancreatitis. 2 aunts died of pancreatic cancer in their 69s. C. difficile earlier this year treated at [place], [place]. The patient reports losing 35 pounds since he started having abdominal pain several months ago. To compound his recent medical problems, he is known to have factor V Lyme deficiency and suffered a recent pulmonary embolus. He is on lifelong anticoagulation therapy with Coumadin.  Past Medical History  Diagnosis Date  . Pulmonary embolism   . Cystic thyroid nodule     left  . HTN (hypertension)   . Heterozygous factor V  Leiden mutation   . Pancreatitis 05/2011    Past Surgical History  Procedure Date  . Neck surgery     C5/C6 fusion  . Knee surgery     arthroscopy, left knee  . Femur fracture surgery   . Nerve surgery     right thumb  . Colonoscopy 01/2011    Dr. Zenon Mayo at [facility]-->normal  . Esophagogastroduodenoscopy 01/2011    Dr. Zenon Mayo at [facility]-->bile reflux in stomach-->patient c/o inadequate conscious sedation (Fentanyl 100mg /Versed 8mg )    Prior to Admission medications   Medication Sig Start Date End Date Taking? Authorizing Provider  amitriptyline (ELAVIL) 10 MG tablet Take 10 mg by mouth at bedtime. As needed    Yes Historical Provider, MD  amLODipine (NORVASC) 10 MG tablet Take 10 mg by mouth daily.     Yes Historical Provider, MD  diazepam (VALIUM) 5 MG tablet Take 5 mg by mouth every 6 (six) hours as needed. Takes two tablets at bedtime    Yes Historical Provider, MD  esomeprazole (NEXIUM) 40 MG capsule Take 40 mg by mouth 2 (two) times daily.     Yes Historical Provider, MD  folic acid (FOLVITE) 1 MG tablet Take 1 mg by mouth daily.     Yes Historical Provider, MD  methadone (DOLOPHINE) 10 MG tablet Take 10 mg by mouth 2 (two) times daily. Takes four pills three times daily   Yes Historical Provider, MD  metoCLOPramide (REGLAN) 10 MG tablet Take 10 mg by mouth daily. Takes one tablet at bedtime (prescribed  qid)   Yes Historical Provider, MD  oxyCODONE (OXYCONTIN) 15 MG TB12 Take 15 mg by mouth every 12 (twelve) hours. Takes 30 mg four times daily   Yes Historical Provider, MD  warfarin (COUMADIN) 7.5 MG tablet Take 7.5 mg by mouth daily. Takes 7.5 mg daily   Yes Historical Provider, MD  zolpidem (AMBIEN) 10 MG tablet Take 10 mg by mouth at bedtime as needed.     Yes Historical Provider, MD  amitriptyline (ELAVIL) 25 MG tablet Take 25 mg by mouth at bedtime.      Historical Provider, MD  ciprofloxacin (CIPRO) 500 MG tablet Take 500 mg by mouth 2 (two) times daily.      Historical  Provider, MD  esomeprazole (NEXIUM) 40 MG capsule Take 1 capsule (40 mg total) by mouth daily before breakfast. 04/18/11 04/17/12  Tana Coast, PA  nicotine (NICODERM CQ - DOSED IN MG/24 HOURS) 21 mg/24hr patch Place 1 patch onto the skin daily.      Historical Provider, MD  Pancrelipase, Lip-Prot-Amyl, (ZENPEP) 20000 UNITS CPEP Take 2 with meals and one with snacks. 05/24/11   Tana Coast, PA  tiZANidine (ZANAFLEX) 4 MG capsule Take 4 mg by mouth 3 (three) times daily.      Historical Provider, MD    Allergies as of 08/18/2011  . (No Known Allergies)    Family History  Problem Relation Age of Onset  . GI problems Brother   . Colon cancer Neg Hx   . Liver disease Neg Hx   . GI problems      stomach and lung cancer, aunts/uncles  . Heart attack Brother     before age of 57  . Stroke Brother     History   Social History  . Marital Status: Divorced    Spouse Name: N/A    Number of Children: 2  . Years of Education: N/A   Occupational History  .      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 Smoking status: Current Some Day Smoker -- 1.0 packs/day    Types: Cigarettes    Last Attempt to Quit: 01/27/2011  . Smokeless tobacco: Not on file   Comment: smoking one pack daily/ took self off of patch  . Alcohol Use: No     None since hospitalized with pancreatitis in 05/2011. Prior to that, 1-2 wine coolers few days a week. Denies heavy or daily consumption.  . Drug Use: No  . Sexually Active: Not on file   Other Topics Concern  . Not on file   Social History Narrative  . No narrative on file    Review of Systems: See HPI, otherwise negative ROS  Physical Exam: BP 128/82  Pulse 72  Temp(Src) 97 F (36.1 C) (Tympanic)  Ht 6' (1.829 m)  Wt 171 lb 12.8 oz (77.928 kg)  BMI 23.30 kg/m2 General:   Alert,  appears to feel bad but does not appear acutely ill or toxic. He is accompanied by his fiance    Head:  Normocephalic and atraumatic. Eyes:  Sclera clear, no icterus.   Conjunctiva pink. Ears:  Normal auditory acuity. Nose:  No deformity, discharge,  or lesions. Mouth:  No deformity or lesions, dentition normal. Neck:  Supple; no masses or thyromegaly. Lungs:  Clear throughout to auscultation.   No wheezes, crackles, or rhonchi. No acute distress. Heart:  Regular rate and rhythm;  no murmurs, clicks, rubs,  or gallops. Abdomen:   Full. Bowel sounds present but infrequent he has significant epigastric and right upper quadrant tenderness to palpation no obvious mass or organomegaly no guarding or rebound tenderness Impression/Plan:

## 2011-08-19 ENCOUNTER — Ambulatory Visit (HOSPITAL_COMMUNITY)
Admission: RE | Admit: 2011-08-19 | Discharge: 2011-08-19 | Disposition: A | Discharge status: Home / Self Care | Payer: Self-pay | Source: Ambulatory Visit | Attending: Internal Medicine | Admitting: Internal Medicine

## 2011-08-19 ENCOUNTER — Other Ambulatory Visit (HOSPITAL_COMMUNITY): Payer: Self-pay

## 2011-08-19 ENCOUNTER — Telehealth: Payer: Self-pay | Admitting: Urgent Care

## 2011-08-19 DIAGNOSIS — R1012 Left upper quadrant pain: Secondary | ICD-10-CM | POA: Insufficient documentation

## 2011-08-19 DIAGNOSIS — K859 Acute pancreatitis without necrosis or infection, unspecified: Secondary | ICD-10-CM | POA: Insufficient documentation

## 2011-08-19 DIAGNOSIS — K7689 Other specified diseases of liver: Secondary | ICD-10-CM | POA: Insufficient documentation

## 2011-08-19 DIAGNOSIS — R1011 Right upper quadrant pain: Secondary | ICD-10-CM | POA: Insufficient documentation

## 2011-08-19 LAB — LIPASE, BLOOD: Lipase: 37 U/L (ref 11–59)

## 2011-08-19 LAB — CBC
Platelets: 335 10*3/uL (ref 150–400)
RBC: 4.34 MIL/uL (ref 4.22–5.81)
RDW: 13 % (ref 11.5–15.5)
WBC: 8.4 10*3/uL (ref 4.0–10.5)

## 2011-08-19 LAB — COMPREHENSIVE METABOLIC PANEL
ALT: 13 U/L (ref 0–53)
AST: 17 U/L (ref 0–37)
Albumin: 3.4 g/dL — ABNORMAL LOW (ref 3.5–5.2)
Alkaline Phosphatase: 133 U/L — ABNORMAL HIGH (ref 39–117)
CO2: 33 mEq/L — ABNORMAL HIGH (ref 19–32)
Chloride: 102 mEq/L (ref 96–112)
GFR calc non Af Amer: >90 mL/min (ref >90)
Potassium: 3.3 mEq/L — ABNORMAL LOW (ref 3.5–5.1)
Sodium: 141 mEq/L (ref 135–145)
Total Bilirubin: 0.3 mg/dL (ref 0.3–1.2)

## 2011-08-19 LAB — AMYLASE: Amylase: 33 U/L (ref 0–105)

## 2011-08-19 MED ORDER — IOHEXOL 300 MG/ML  SOLN
100.0000 mL | Freq: Once | INTRAMUSCULAR | Status: AC | PRN
  Administered 2011-08-19: 100 mL via INTRAVENOUS

## 2011-08-19 NOTE — Telephone Encounter (Signed)
Dr Jena Gauss advised me to review labs on pt & send pt home from CT if stable. Amylase/Lipase, electrolytes ok except K 3.3.  Lfts show elevated alkaline phosphatase 133. Nothing to explain pain on pancreatic protocol CT. C diff pending-pt did not turn in. Advised lab is open til evening.  Pt needs K rich foods No diuretics Ok to DC home Instructions for fatty liver: Recommend 1-2# weight loss per week until ideal body weight through exercise & diet. Low fat/cholesterol diet. Gradually increase exercise from 15 min daily up to 1 hr per day 5 days/week. Limit alcohol use.   Pt agrees w/ above plan. Needs OV next to follow up if continued pain. Please call Monday to schedule follow up & get progress report. Thanks

## 2011-08-19 NOTE — Assessment & Plan Note (Signed)
Alex Hoffman has had ongoing abdominal pain since his ERP  some 5 weeks ago. He reports being hospitalized [place], [place] for a week. Recurrence of incessant diarrhea is also concerning given his history of Clostridium difficile diarrhea earlier in the year. He has a history of mildly elevated liver enzymes. His common bile duct was dilated previously. EUS demonstrated common bile duct stone. Based on the records, does not sound like he had a cholangiogram or any manipulation of his bile duct during his recent procedure in [place].  It sounds like he may have had an exacerbation of pancreatitis following the ERP.  He appears not to be doing very well. We've discussed the possibility of hospitalization. However, patient wants to remain in an outpatient for as long as he can.    Recommendations:    Remain on a clear liquid diet. Will send him for a pancreatic protocol CT    We'll also obtain a CBC Chem-20 serum amylase and lipase, hepatic function profile near future. In addition, we     will send a stool sample for Clostridium difficile PCR assay.    Further recommendations to follow in the very near future.    No change in medication regimen at this time.

## 2011-08-21 NOTE — Telephone Encounter (Signed)
Also needs repeat check K (potassium) Monday. Thanks

## 2011-08-22 NOTE — Telephone Encounter (Signed)
Spoke with pt, he said he was doing ok and  is feeling a little bit better than he was before. Pt is going to see Dr. Cleone Slim today. He said he wanted to wait and see if he draws any blood today and he will have them fax results to [place].

## 2011-08-22 NOTE — Telephone Encounter (Signed)
Tried to call pt- LMOM 

## 2011-08-22 NOTE — Telephone Encounter (Signed)
Okay; thanks.

## 2011-08-26 ENCOUNTER — Telehealth: Payer: Self-pay | Admitting: Urgent Care

## 2011-08-26 NOTE — Telephone Encounter (Signed)
Please call patient to see if he had potassium done?

## 2011-08-28 NOTE — Progress Notes (Signed)
Pt needs a f/u appt w extender in the next 2 weeks

## 2011-08-29 ENCOUNTER — Telehealth: Payer: Self-pay | Admitting: Internal Medicine

## 2011-08-29 LAB — CBC WITH DIFFERENTIAL/PLATELET
Albumin: 3.6
Alkaline Phosphatase: 103 U/L
Amylase: 29 units/L (ref 25–110)
CO2: 32 mmol/L
Calcium: 8.7 mg/dL
Creat: 0.52
Sodium: 142 mmol/L (ref 137–147)
WBC: 9.4

## 2011-08-29 NOTE — Progress Notes (Signed)
Pt is aware of OV for 11/1 at 0930 with KJ

## 2011-08-29 NOTE — Telephone Encounter (Signed)
Called in K+ to [facility] in [place] for pt.

## 2011-08-29 NOTE — Progress Notes (Signed)
Quick Note:  Pt has OV this week ______

## 2011-08-29 NOTE — Progress Notes (Signed)
I called and spoke with Alex Hoffman and he said that Dr Cleone Slim told him that he should probably have an ifobt done due to his history of cdiff and needed to be done ASAP. I sent telephone note to KJ and JL to advise if this needed to be done prior to OV. No appt has been made at this time.

## 2011-08-29 NOTE — Telephone Encounter (Signed)
See phone note from today. Already addressed.

## 2011-08-29 NOTE — Telephone Encounter (Signed)
I called pt to set up 2 weeks f/u with extender per RMR, but was leaving Dr Bertha Stakes office and told pt that he should have stool culture done ASAP due to him having history of C-dif.  I told pt that I would get with RMR's nurse and find out if he needed this done prior to OV. Please advise. [phone]

## 2011-08-29 NOTE — Telephone Encounter (Signed)
I understand patient has appointment with me on Thursday K. of 3.0 Please call pharmacy Patient should take KCl twice a day x2 days, then daily for one week. #20, 0RF He needs to return C. difficile to the lab.

## 2011-08-29 NOTE — Telephone Encounter (Signed)
Called Dr.Karb's office to get results of K+ and there are 3.0 and I have the other labs faxed to our office. Waiting on Dr. Bertha Stakes office to call back about if they are going to handle the K+ or if we need to.

## 2011-08-29 NOTE — Telephone Encounter (Signed)
Pt has OV on 11/1 at 0930 with KJ

## 2011-08-29 NOTE — Telephone Encounter (Signed)
Please call pt. He needs him K checked today if this has not already been done. Thank you

## 2011-09-01 ENCOUNTER — Ambulatory Visit (INDEPENDENT_AMBULATORY_CARE_PROVIDER_SITE_OTHER): Payer: Self-pay | Admitting: Urgent Care

## 2011-09-01 ENCOUNTER — Encounter: Payer: Self-pay | Admitting: Urgent Care

## 2011-09-01 VITALS — BP 114/72 | HR 59 | Temp 97.2°F | Ht 72.0 in | Wt 171.6 lb

## 2011-09-01 DIAGNOSIS — G8929 Other chronic pain: Secondary | ICD-10-CM

## 2011-09-01 DIAGNOSIS — R109 Unspecified abdominal pain: Secondary | ICD-10-CM

## 2011-09-01 DIAGNOSIS — R197 Diarrhea, unspecified: Secondary | ICD-10-CM

## 2011-09-01 DIAGNOSIS — E876 Hypokalemia: Secondary | ICD-10-CM

## 2011-09-01 NOTE — Telephone Encounter (Signed)
Noted  

## 2011-09-01 NOTE — Patient Instructions (Signed)
Return you next stool to lab ASAP Begin ALIGN once daily Go get your potassium rechecked Tomorrow   Diarrhea Infections caused by germs (bacterial) or a virus commonly cause diarrhea. Your caregiver has determined that with time, rest and fluids, the diarrhea should improve. In general, eat normally while drinking more water than usual. Although water may prevent dehydration, it does not contain salt and minerals (electrolytes). Broths, weak tea without caffeine and oral rehydration solutions (ORS) replace fluids and electrolytes. Small amounts of fluids should be taken frequently. Large amounts at one time may not be tolerated. Plain water may be harmful in infants and the elderly. Oral rehydrating solutions (ORS) are available at pharmacies and grocery stores. ORS replace water and important electrolytes in proper proportions. Sports drinks are not as effective as ORS and may be harmful due to sugars worsening diarrhea.  ORS is especially recommended for use in children with diarrhea. As a general guideline for children, replace any new fluid losses from diarrhea and/or vomiting with ORS as follows:   If your child weighs 22 pounds or under (10 kg or less), give 60-120 mL ( -  cup or 2 - 4 ounces) of ORS for each episode of diarrheal stool or vomiting episode.   If your child weighs more than 22 pounds (more than 10 kgs), give 120-240 mL ( - 1 cup or 4 - 8 ounces) of ORS for each diarrheal stool or episode of vomiting.   While correcting for dehydration, children should eat normally. However, foods high in sugar should be avoided because this may worsen diarrhea. Large amounts of carbonated soft drinks, juice, gelatin desserts and other highly sugared drinks should be avoided.   After correction of dehydration, other liquids that are appealing to the child may be added. Children should drink small amounts of fluids frequently and fluids should be increased as tolerated. Children should drink  enough fluids to keep urine clear or pale yellow.   Adults should eat normally while drinking more fluids than usual. Drink small amounts of fluids frequently and increase as tolerated. Drink enough fluids to keep urine clear or pale yellow. Broths, weak decaffeinated tea, lemon lime soft drinks (allowed to go flat) and ORS replace fluids and electrolytes.   Avoid:   Carbonated drinks.   Juice.   Extremely hot or cold fluids.   Caffeine drinks.   Fatty, greasy foods.   Alcohol.   Tobacco.   Too much intake of anything at one time.   Gelatin desserts.   Probiotics are active cultures of beneficial bacteria. They may lessen the amount and number of diarrheal stools in adults. Probiotics can be found in yogurt with active cultures and in supplements.   Wash hands well to avoid spreading bacteria and virus.   Anti-diarrheal medications are not recommended for infants and children.   Only take over-the-counter or prescription medicines for pain, discomfort or fever as directed by your caregiver. Do not give aspirin to children because it may cause Reye's Syndrome.   For adults, ask your caregiver if you should continue all prescribed and over-the-counter medicines.   If your caregiver has given you a follow-up appointment, it is very important to keep that appointment. Not keeping the appointment could result in a chronic or permanent injury, and disability. If there is any problem keeping the appointment, you must call back to this facility for assistance.  SEEK IMMEDIATE MEDICAL CARE IF:   You or your child is unable to keep fluids down or  other symptoms or problems become worse in spite of treatment.   Vomiting or diarrhea develops and becomes persistent.   There is vomiting of blood or bile (green material).   There is blood in the stool or the stools are black and tarry.   There is no urine output in 6-8 hours or there is only a small amount of very dark urine.    Abdominal pain develops, increases or localizes.   You have a fever.   Your baby is older than 3 months with a rectal temperature of 102 F (38.9 C) or higher.   Your baby is 71 months old or younger with a rectal temperature of 100.4 F (38 C) or higher.   You or your child develops excessive weakness, dizziness, fainting or extreme thirst.   You or your child develops a rash, stiff neck, severe headache or become irritable or sleepy and difficult to awaken.  MAKE SURE YOU:   Understand these instructions.   Will watch your condition.   Will get help right away if you are not doing well or get worse.  Document Released: 10/07/2002 Document Revised: 06/29/2011 Document Reviewed: 08/24/2009 [facility] Patient Information 2012 [place], [place].

## 2011-09-01 NOTE — Telephone Encounter (Signed)
He picked it up on 08/29/2011 at 5:17pm

## 2011-09-01 NOTE — Progress Notes (Signed)
Primary Care Physician:  [name], MD Primary Gastroenterologist:  Dr. Jena Gauss  Chief Complaint  Patient presents with  . Abdominal Pain   HPI:  Alex Hoffman is a 50 y.o. male here for follow up for history of pancreatitis. He has had chronic abdominal pain since ERCP at [facility] for almost 2 months.he has also had chronic diarrhea and gives history of C. Difficile earlier this year.  He complains of  watery diarrhea 5 times per day.  For 3 weeks now, we have asked him to turn a stool sample for C. difficile by PCR into the lab, however he has been noncompliant.  Previously could not tolerate Elavil secondary to dreams. Has been taking Oxycodone & methadone for pancreatitis from Dr Cleone Slim in [place].  C/o severe LLQ pain radiates to back.  10/10 @Lots  of dry heaves.  On liquid diet since 7/6.  Wt stable.  Started potassium on 10/29 for a potassium of 3.  Recent pancreatic protocol CT->Pancreas enhances normally without gross evidence of acute pancreatitis, pancreatic ductal dilatation, or calcification.  No peripancreatic fluid collections or edema. Fatty infiltration of liver.  No acute intra abdominal or intrapelvic abnormalities identified.  Prior MRCP and EUS demonstrated a dilated pancreatic duct with 2-3 3-4 mm filling defects in PD on EUS per Dr. Christella Hartigan . Also there was a filling defect in the common bile duct suggestive of choledocholithiasis.  ERCP in September 2012 by Dr. Logan Bores & pancreatogram with pancreatic sphincterotomy was performed, PD stones removed, bile duct not manipulated.  Pancreatic duct stent was not placed. Recent Results (from the past 1008 hour(s))  CBC WITH DIFFERENTIAL      Component Value Range   Glucose 113     BUN 6  4 - 21 (mg/dL)   Creat 2.13     Calcium 8.7     Total Protein 6.5     Albumin 3.6     Alkaline Phosphatase 103     AST 18     Total Bilirubin 0.6     ALT 12  10 - 40 (U/L)   Sodium 142  137 - 147 (mmol/L)   Potassium 3.0     Chloride 103     CO2 32     Amylase 29  25 - 110 (units/L)   Anion Gap 10  High:30(mmol/L)   INR 5.1     WBC 9.4     Hemoglobin 12.7 (*) 13.5 - 17.5 (g/dL)   HCT 37     platelet count 342    AMYLASE   Collection Time   08/19/11 10:40 AM      Component Value Range   Amylase 33  0 - 105 (U/L)  CBC   Collection Time   08/19/11 10:40 AM      Component Value Range   WBC 8.4  4.0 - 10.5 (K/uL)   RBC 4.34  4.22 - 5.81 (MIL/uL)   Hemoglobin 13.0  13.0 - 17.0 (g/dL)   HCT 08.6 (*) 57.8 - 52.0 (%)   MCV 86.4  78.0 - 100.0 (fL)   MCH 30.0  26.0 - 34.0 (pg)   MCHC 34.7  30.0 - 36.0 (g/dL)   RDW 46.9  62.9 - 52.8 (%)   Platelets 335  150 - 400 (K/uL)  COMPREHENSIVE METABOLIC PANEL   Collection Time   08/19/11 10:40 AM      Component Value Range   Sodium 141  135 - 145 (mEq/L)   Potassium 3.3 (*) 3.5 - 5.1 (mEq/L)  Chloride 102  96 - 112 (mEq/L)   CO2 33 (*) 19 - 32 (mEq/L)   Glucose, Bld 85  70 - 99 (mg/dL)   BUN 6  6 - 23 (mg/dL)   Creatinine, Ser 1.61  0.50 - 1.35 (mg/dL)   Calcium 9.2  8.4 - 09.6 (mg/dL)   Total Protein 6.7  6.0 - 8.3 (g/dL)   Albumin 3.4 (*) 3.5 - 5.2 (g/dL)   AST 17  0 - 37 (U/L)   ALT 13  0 - 53 (U/L)   Alkaline Phosphatase 133 (*) 39 - 117 (U/L)   Total Bilirubin 0.3  0.3 - 1.2 (mg/dL)   GFR calc non Af Amer >90  >90 (mL/min)   GFR calc Af Amer >90  >90 (mL/min)  BILIRUBIN, DIRECT   Collection Time   08/19/11 10:40 AM      Component Value Range   Bilirubin, Direct <0.1  0.0 - 0.3 (mg/dL)  LIPASE, BLOOD   Collection Time   08/19/11 10:40 AM      Component Value Range   Lipase 37  11 - 59 (U/L)   Past Medical History  Diagnosis Date  . Pulmonary embolism 12/2010  . Cystic thyroid nodule     left  . HTN (hypertension)   . Heterozygous factor V Leiden mutation 12/2010  . Pancreatitis 05/2011    EUS Dr Christella Hartigan 06/09/11-> dilated main pancreatic duct in HOP, 2-3 filling defects distal pancreatic duct, peripancreatic fluid resolved  . C. difficile colitis 01/2011?    Past Surgical History  Procedure Date  . Neck surgery     C5/C6 fusion  . Knee surgery     arthroscopy, left knee  . Femur fracture surgery   . Nerve surgery     right thumb  . Colonoscopy 01/2011    Dr. Zenon Mayo at [facility]-->normal  . Esophagogastroduodenoscopy 01/2011    Dr. Zenon Mayo at [facility]-->bile reflux in stomach-->patient c/o inadequate conscious sedation (Fentanyl 100mg /Versed 8mg )  . Ercp 07/2011    Dr. Logan Bores [facility]-> pancreatic enterotomy, pancreatic duct stones removed, bile duct not manipulated, no stents   Current Outpatient Prescriptions  Medication Sig Dispense Refill  . amLODipine (NORVASC) 10 MG tablet Take 10 mg by mouth daily.        . diazepam (VALIUM) 5 MG tablet Take 5 mg by mouth every 6 (six) hours as needed. Takes two tablets at bedtime       . esomeprazole (NEXIUM) 40 MG capsule Take 40 mg by mouth 2 (two) times daily.        . folic acid (FOLVITE) 1 MG tablet Take 1 mg by mouth daily.        . methadone (DOLOPHINE) 10 MG tablet Take 10 mg by mouth 2 (two) times daily. Takes four pills three times daily      . metoCLOPramide (REGLAN) 10 MG tablet Take 10 mg by mouth daily. Takes one tablet at bedtime (prescribed qid)      . oxyCODONE (OXYCONTIN) 15 MG TB12 Take 15 mg by mouth every 12 (twelve) hours. Takes 30 mg four times daily      . Pancrelipase, Lip-Prot-Amyl, (ZENPEP) 20000 UNITS CPEP Take 2 with meals and one with snacks.  112 capsule  0  . POTASSIUM CHLORIDE PO Take 20 mEq by mouth daily.        Marland Kitchen warfarin (COUMADIN) 7.5 MG tablet Take 7.5 mg by mouth daily. Takes 7.5 mg daily      . zolpidem (AMBIEN) 10  MG tablet Take 10 mg by mouth at bedtime as needed.         Allergies as of 09/01/2011  . (No Known Allergies)  Review of Systems: See HPI, otherwise normal ROS  Physical Exam: BP 114/72  Pulse 59  Temp(Src) 97.2 F (36.2 C) (Temporal)  Ht 6' (1.829 m)  Wt 171 lb 9.6 oz (77.837 kg)  BMI 23.27 kg/m2 General:   Alert,  Well-developed,  well-nourished, pleasant and cooperative in NAD.  Head:  Normocephalic and atraumatic. Eyes:  Sclera clear, no icterus.   Conjunctiva pink. Mouth:  No deformity or lesions, OP pink/moist. Neck:  Supple; no masses or thyromegaly. Heart:  Regular rate and rhythm; no murmurs, clicks, rubs,  or gallops. Abdomen:  Pt moaning with chronic back pain as he lies on exam table.  C/o abdominal pain out of proportion to objective findings & tenderness prior to palpation & with palpation to entire abdomen.  Soft, nondistended. No masses, hepatosplenomegaly or hernias noted. Normal bowel sounds, and without rebound.   Msk:  Symmetrical without gross deformities. Normal posture. Pulses:  Normal pulses noted. Extremities:  Without clubbing or edema. Neurologic:  Alert and  oriented x4;  grossly normal neurologically. Skin:  Intact without significant lesions or rashes. Cervical Nodes:  No significant cervical adenopathy. Psych:  Alert and cooperative. Normal mood and affect.

## 2011-09-01 NOTE — Telephone Encounter (Signed)
Please call [facility] in [place] to see date pt picked up potassium. He cannot remember when he started. Thanks

## 2011-09-02 ENCOUNTER — Encounter: Payer: Self-pay | Admitting: Urgent Care

## 2011-09-02 DIAGNOSIS — R197 Diarrhea, unspecified: Secondary | ICD-10-CM | POA: Insufficient documentation

## 2011-09-02 LAB — POTASSIUM: Potassium: 3.7 mEq/L (ref 3.5–5.3)

## 2011-09-02 NOTE — Assessment & Plan Note (Addendum)
Alex Hoffman is a 50 y.o. 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  Recent CT w/ pancreatic protocol shows normal pancreas.  Chronic back pain no doubt contributing to his discomfort.  There is concern for recurrent c diff.  He should follow up with his PCP regarding his chronic back pain.

## 2011-09-02 NOTE — Progress Notes (Signed)
Cc to PCP 

## 2011-09-02 NOTE — Assessment & Plan Note (Signed)
Recheck tomorrow s/p repletion.

## 2011-09-02 NOTE — Assessment & Plan Note (Addendum)
Chronic diarrhea w/ hypokalemia.  He has been asked several times to return c diff specimen to the lab, but has been non-compliant.  I urged him to do this within the next 24 hrs.  Return you next stool to lab ASAP Begin ALIGN once daily

## 2011-09-05 NOTE — Progress Notes (Signed)
Quick Note:  Please let pt know K normal. No need for further K supplement at this time.  ______

## 2011-09-07 ENCOUNTER — Other Ambulatory Visit: Payer: Self-pay | Admitting: Gastroenterology

## 2011-09-07 ENCOUNTER — Ambulatory Visit (HOSPITAL_COMMUNITY)
Admission: RE | Admit: 2011-09-07 | Discharge: 2011-09-07 | Disposition: A | Discharge status: Home / Self Care | Payer: Self-pay | Source: Ambulatory Visit | Attending: Urgent Care | Admitting: Urgent Care

## 2011-09-07 ENCOUNTER — Telehealth: Payer: Self-pay | Admitting: Internal Medicine

## 2011-09-07 DIAGNOSIS — R109 Unspecified abdominal pain: Secondary | ICD-10-CM

## 2011-09-07 NOTE — Telephone Encounter (Signed)
Patient has not been able to get stool sample because he hasnt been able to have a bowel movement. He has tried [employer] but no improvement and he is having a lot of abdominal discomfort that's keeping him up at night, please advise??

## 2011-09-07 NOTE — Telephone Encounter (Signed)
Please address w/ RMR [id number] Thanks

## 2011-09-07 NOTE — Telephone Encounter (Signed)
Pt aware, will go to [facility] now.

## 2011-09-07 NOTE — Telephone Encounter (Signed)
Pt aware, gave him the number for [facility]. He said he will call and try to make appt.

## 2011-09-07 NOTE — Telephone Encounter (Signed)
Needs plain abd films today TO ER if severe pain Further recommendations following xray

## 2011-09-07 NOTE — Telephone Encounter (Signed)
Routed to KJ 

## 2011-09-07 NOTE — Progress Notes (Signed)
Quick Note:  Discussed with patient. See phone note from today. ______

## 2011-09-07 NOTE — Telephone Encounter (Signed)
Anytime he eats food, he has abdominal pain. Every night & all day, wakes up w/ severe abdominal pain through gut to back. Taking oxycodone 30mg  q 6hrs (Dr Cleone Slim) for pain & methadone 40mg  TID. Pain 6/10 w/ meds. Last BM 3 days ago-liquid.  States he cannot get sample as it has been too hard. Dr Logan Bores at [facility] plans for cholecystectomy  Dr Jena Gauss paged to discuss next step  Discussed w/ Dr Jena Gauss. Please call patient and let him know- 1) Dr. Jena Gauss would like him to return to see Dr. Logan Bores at [facility]. He needs to call & make FU appt. 2) He should return c diff ASAP  Camille- Dr. Jena Gauss feels due to pt non-compliance, this is not a productive relationship.  Please send discharge letter.

## 2011-09-07 NOTE — Telephone Encounter (Signed)
Order put in.

## 2011-09-07 NOTE — Telephone Encounter (Signed)
Pt called back and spoke with CD. He was only seen at [facility] as an inpatient and has not been seen in their office. Pt said they will not make him an appt without a referral from [place]. Please advise.

## 2011-09-07 NOTE — Telephone Encounter (Signed)
Routed to RMR and CM

## 2011-09-07 NOTE — Progress Notes (Signed)
Quick Note:  Patient is noncompliant. We have asked on several occasions for him to turn in a stool for C. differential PCR for continued diarrhea. When I saw him last week, he told me he would return in ASAP. ______

## 2011-09-08 ENCOUNTER — Telehealth: Payer: Self-pay | Admitting: Gastroenterology

## 2011-09-08 NOTE — Telephone Encounter (Signed)
I called [facility] and spoke with Chasity in the GI Clinic- pt was seen as an inpatient only by Dr Benedetto Goad for abdominal pain- this was back in Sept- pt has not been seen in the general surgery clinic either per [place] Bad- he will need a referral by a Dr to be seen in the clinics

## 2011-09-09 NOTE — Telephone Encounter (Signed)
I called and spoke with pt and advised him that we would not be able to send the referral to [facility] but his PCP should be able to take care of that for him- Alex Hoffman understood this and said he would contact his PCP-

## 2011-09-09 NOTE — Telephone Encounter (Signed)
Ok; we can refer

## 2011-09-12 NOTE — Telephone Encounter (Signed)
Routed to CD 

## 2011-09-30 ENCOUNTER — Ambulatory Visit: Payer: Self-pay | Admitting: Internal Medicine

## 2011-11-08 ENCOUNTER — Encounter: Payer: Self-pay | Admitting: Gastroenterology

## 2011-11-08 ENCOUNTER — Ambulatory Visit (INDEPENDENT_AMBULATORY_CARE_PROVIDER_SITE_OTHER): Payer: Self-pay | Admitting: Gastroenterology

## 2011-11-08 VITALS — BP 106/74 | HR 74 | Temp 97.6°F | Ht 73.0 in | Wt 181.8 lb

## 2011-11-08 DIAGNOSIS — K859 Acute pancreatitis without necrosis or infection, unspecified: Secondary | ICD-10-CM

## 2011-11-08 DIAGNOSIS — G8929 Other chronic pain: Secondary | ICD-10-CM

## 2011-11-08 DIAGNOSIS — R945 Abnormal results of liver function studies: Secondary | ICD-10-CM

## 2011-11-08 DIAGNOSIS — R109 Unspecified abdominal pain: Secondary | ICD-10-CM

## 2011-11-08 DIAGNOSIS — R7989 Other specified abnormal findings of blood chemistry: Secondary | ICD-10-CM

## 2011-11-08 LAB — COMPREHENSIVE METABOLIC PANEL
Albumin: 4.2
Amylase: 33 units/L (ref 25–110)
BUN: 8 mg/dL (ref 4–21)
Calcium: 9.3 mg/dL
Creat: 0.66
INR: 1.4
Lipase: 20 units/L (ref 0–53)
Total Protein ELP: 0.7

## 2011-11-08 LAB — CBC WITH DIFFERENTIAL/PLATELET: WBC: 9.4

## 2011-11-08 NOTE — Progress Notes (Signed)
Primary Care Physician: [name], MD  Primary Gastroenterologist:  Roetta Sessions, MD   Chief Complaint  Patient presents with  . Abdominal Pain    HPI: Alex Hoffman is a 51 y.o. male here for f/u of persistent abdominal pain. Last seen 09/2011. H/O pancreatitis. ERCP at [facility] 07/2011 for pancreatic duct stones. Reportedly, stones removed from pancreatic duct but CBD not manipulated. EUS by Dr. Rob Bunting prior to this showed CBD stone as well. Patient c/o ongoing pp abdominal pain. Not any better since ERCP. Eating very bland foods due to ongoing abdominal pain. Doubled over with pain day after Thanksgiving, ate calzone. Pain into back. No heartburn. N/V. BM from diarrhea to not passing BM for 3-4 days. No melena, brbpr.   Seven weeks of doxycycline and one week of septra for prostatitis. Lots of pain in groin into back. Hurts to urinate. Feels like to pass a stone. Appointment with [facility] in 12/2011.   Dr. Carolynne Edouard supposed to take gallbladder out. Has apppt with Dr. Cleone Slim today as well.     Current Outpatient Prescriptions  Medication Sig Dispense Refill  . amLODipine (NORVASC) 10 MG tablet Take 10 mg by mouth daily.        . diazepam (VALIUM) 5 MG tablet Take 5 mg by mouth every 6 (six) hours as needed. Takes two tablets at bedtime       . esomeprazole (NEXIUM) 40 MG capsule Take 40 mg by mouth 2 (two) times daily.        . methadone (DOLOPHINE) 10 MG tablet Take 10 mg by mouth 3 (three) times daily. Takes five pills three times daily      . metoCLOPramide (REGLAN) 10 MG tablet Take 10 mg by mouth daily. Takes one tablet at bedtime (prescribed qid)      . oxycodone (ROXICODONE) 30 MG immediate release tablet Take 30 mg by mouth 4 (four) times daily.        Marland Kitchen warfarin (COUMADIN) 7.5 MG tablet Take 5 mg by mouth daily. Takes 5 mg daily      . zolpidem (AMBIEN) 10 MG tablet Take 10 mg by mouth at bedtime as needed.        . folic acid (FOLVITE) 1 MG tablet Take 1 mg by  mouth daily.        Marland Kitchen oxyCODONE (OXYCONTIN) 15 MG TB12 Take 15 mg by mouth every 12 (twelve) hours. Takes 30 mg four times daily      . Pancrelipase, Lip-Prot-Amyl, (ZENPEP) 20000 UNITS CPEP Take 2 with meals and one with snacks.  112 capsule  0  . POTASSIUM CHLORIDE PO Take 20 mEq by mouth daily.         Allergies as of 11/08/2011  . (No Known Allergies)    ROS:  General:Positive for anorexia No weight loss, fever, chills, fatigue, weakness. ENT: Negative for hoarseness, difficulty swallowing , nasal congestion. CV: Negative for chest pain, angina, palpitations, dyspnea on exertion, peripheral edema.  Respiratory: Negative for dyspnea at rest, dyspnea on exertion, cough, sputum, wheezing.  GI: See history of present illness. GU:  Positive for dysuria, frequency. No hematuria, urinary incontinence,  nocturnal urination.  Endo: Negative for unusual weight change.    Physical Examination:   BP 106/74  Pulse 74  Temp(Src) 97.6 F (36.4 C) (Temporal)  Ht 6\' 1"  (1.854 m)  Wt 181 lb 12.8 oz (82.464 kg)  BMI 23.99 kg/m2  General: Well-nourished, well-developed in no acute distress. Appears uncomfortable. Accompanied by wife.  Eyes: No icterus. Mouth: Oropharyngeal mucosa moist and pink , no lesions erythema or exudate. Lungs: Clear to auscultation bilaterally.  Heart: Regular rate and rhythm, no murmurs rubs or gallops.  Abdomen: Bowel sounds are normal, moderate tenderness in epigastrium and suprapubic area.  Nondistended, no hepatosplenomegaly or masses, no abdominal bruits or hernia , no rebound or guarding.   Extremities: No lower extremity edema. No clubbing or deformities. Neuro: Alert and oriented x 4   Skin: Warm and dry, no jaundice.   Psych: Alert and cooperative, normal mood and affect.

## 2011-11-08 NOTE — Patient Instructions (Signed)
Please have your lab work done today. Once we have the results, we will determine next step. Follow up with urologist regarding urinary symptoms.

## 2011-11-09 ENCOUNTER — Encounter: Payer: Self-pay | Admitting: Gastroenterology

## 2011-11-09 NOTE — Progress Notes (Signed)
Quick Note:  Please let patient know his labs were good. I have had to request ERCP note from [facility]. Discussed with Dr. Jena Gauss. He wants to see note before further instructions. We need to know whether or not he had CBD stone. Further recommendations to follow. ______

## 2011-11-09 NOTE — Assessment & Plan Note (Addendum)
Persistent abdominal pain worse pp associated with intermittent n/v. H/O pancreatic and CBD stones s/p ERCP with removal of pancreatic duct stones but reportedly CBD not manipulated. Patient has upcoming appointment with Dr. Carolynne Edouard to consider cholecystectomy. 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 ?symptoms secondary to retained CBD stone, gallbladder disease, or recurrent pancreatitis.   Labs. Retrieve ERCP note. Patient may need repeat imaging of CBD.  Patients lower abdominal pain likely related to urinary issues. F/U with urologist.

## 2011-11-10 NOTE — Progress Notes (Signed)
Cc to PCP 

## 2011-11-10 NOTE — Progress Notes (Signed)
Quick Note:  Tried to call pt- NA Left detailed message on cell number. ______

## 2011-11-16 ENCOUNTER — Telehealth: Payer: Self-pay | Admitting: Internal Medicine

## 2011-11-16 NOTE — Telephone Encounter (Signed)
RMR paged

## 2011-11-16 NOTE — Telephone Encounter (Signed)
?  in hospital with GI stuff?  Standard would be for attending physician to contact their GI MD at [facility]. We do not practice at [facility]. Attending can make contact with any outside physician for questions but the attending would need to call Dr. Jena Gauss.  You can run this by Dr. Jena Gauss just in case he has any other advice. Send him text page.

## 2011-11-16 NOTE — Telephone Encounter (Signed)
Am sorry to hear he is hospitalized in [facility]. It would be inappropriate for me to make an unsolicited phone call to his caregivers at the request of a third party.  However, should his caregivers desire additional information or wish to speak with me, I'd be happy to discuss if they make contact with me.

## 2011-11-16 NOTE — Telephone Encounter (Signed)
Pt's wife/girlfriend called this morning to let us know that Pt was in [facility] and that Dr Cleone Slim was on vacation.  She wants RMR or LSL to get with the doctors at [facility] to let them know what RMR wants done. I told lady that RMR does not practice at  [facility] that he could be seen at [facility] but I would let him and LSL know.  She can be reached on her cell phone [phone]

## 2011-11-16 NOTE — Telephone Encounter (Signed)
Spoke with Staci Acosta, pts girlfriend- she said pt was admitted at 4am with abd pain and swelling. He is currently sleeping because they gave him ativan.  Advised her to have pts doctor call us if they need to speak with Dr. Jena Gauss. She verbalized understanding.

## 2011-11-17 NOTE — Progress Notes (Signed)
Requested.

## 2011-11-17 NOTE — Progress Notes (Signed)
Quick Note:  Please request ercp from [facility] again. Still haven't received.  ______

## 2011-12-02 ENCOUNTER — Ambulatory Visit (INDEPENDENT_AMBULATORY_CARE_PROVIDER_SITE_OTHER): Payer: Self-pay | Admitting: Urology

## 2011-12-02 ENCOUNTER — Telehealth: Payer: Self-pay | Admitting: Gastroenterology

## 2011-12-02 DIAGNOSIS — N509 Disorder of male genital organs, unspecified: Secondary | ICD-10-CM

## 2011-12-02 DIAGNOSIS — R351 Nocturia: Secondary | ICD-10-CM

## 2011-12-02 DIAGNOSIS — R109 Unspecified abdominal pain: Secondary | ICD-10-CM

## 2011-12-02 DIAGNOSIS — R39198 Other difficulties with micturition: Secondary | ICD-10-CM

## 2011-12-02 DIAGNOSIS — R3916 Straining to void: Secondary | ICD-10-CM

## 2011-12-02 NOTE — Telephone Encounter (Signed)
Please request d/c summary and labs and xrays from 11/2011 hospitalization at [facility].

## 2011-12-02 NOTE — Telephone Encounter (Signed)
Requested Records.  

## 2011-12-06 ENCOUNTER — Ambulatory Visit (INDEPENDENT_AMBULATORY_CARE_PROVIDER_SITE_OTHER): Payer: Self-pay | Admitting: Urology

## 2011-12-06 DIAGNOSIS — G8929 Other chronic pain: Secondary | ICD-10-CM

## 2011-12-06 DIAGNOSIS — N509 Disorder of male genital organs, unspecified: Secondary | ICD-10-CM

## 2011-12-06 DIAGNOSIS — R3915 Urgency of urination: Secondary | ICD-10-CM

## 2011-12-06 DIAGNOSIS — R109 Unspecified abdominal pain: Secondary | ICD-10-CM

## 2011-12-15 ENCOUNTER — Encounter (INDEPENDENT_AMBULATORY_CARE_PROVIDER_SITE_OTHER): Payer: Self-pay | Admitting: General Surgery

## 2011-12-15 ENCOUNTER — Ambulatory Visit (INDEPENDENT_AMBULATORY_CARE_PROVIDER_SITE_OTHER): Payer: Self-pay | Admitting: General Surgery

## 2011-12-15 VITALS — BP 150/92 | HR 92 | Temp 98.1°F | Ht 72.0 in | Wt 180.8 lb

## 2011-12-15 DIAGNOSIS — G8929 Other chronic pain: Secondary | ICD-10-CM

## 2011-12-15 DIAGNOSIS — R109 Unspecified abdominal pain: Secondary | ICD-10-CM

## 2011-12-15 NOTE — Patient Instructions (Signed)
Will refer to Dr. Marilynn Rail at [facility]

## 2011-12-16 ENCOUNTER — Encounter (INDEPENDENT_AMBULATORY_CARE_PROVIDER_SITE_OTHER): Payer: Self-pay | Admitting: General Surgery

## 2011-12-16 NOTE — Progress Notes (Signed)
Subjective:     Patient ID: Alex Hoffman, male   DOB: 05-02-1961, 51 y.o.   MRN: [id number]  HPI We are asked to see the pt in consultation by Dr. Cleone Slim to evaluate him for abd pain. The pt is a 51 yo wm who states that he has been sick for about the last year. He indicates that most of his pain is on his left flank and pelvic area although he does have some RUQ pain as well. He denies much nausea or vomiting with the pain and the pain has been pretty constant. He has had an ultrasound and CT that did not show gallstones. He apparently had an ERCP done at [facility] that showed some stones in his pancreatic duct but nothing in his CBD. He then had a repeat ERCP done is [facility] that showed some stones in both the CBD and pancreatic duct. No stents were ever placed as best I can tell. He has been hospitalized several times with pancreatitis. He has also been treated recently for C. Diff colitis but it is not clear if he was treated for an adequate period of time and he continues to have diarrhea. He apparently also has some chronic pain issues and is taking methadone at home. He also notes having difficulty with urination and is seeing a urologist in town.  Review of Systems  Constitutional: Positive for activity change.  HENT: Negative.   Eyes: Negative.   Respiratory: Negative.   Cardiovascular: Negative.   Gastrointestinal: Positive for abdominal pain and diarrhea.  Genitourinary: Positive for dysuria and difficulty urinating.  Musculoskeletal: Negative.   Skin: Negative.   Neurological: Negative.   Hematological: Negative.   Psychiatric/Behavioral: Negative.        Objective:   Physical Exam  Constitutional: He is oriented to person, place, and time. He appears well-developed and well-nourished.  HENT:  Head: Normocephalic and atraumatic.  Eyes: Conjunctivae and EOM are normal. Pupils are equal, round, and reactive to light.  Neck: Normal range of motion. Neck supple.    Cardiovascular: Normal rate, regular rhythm and normal heart sounds.   Pulmonary/Chest: Effort normal and breath sounds normal.  Abdominal:       Moderate tenderness in all 4 quadrants. Worse in left flank and pubic areas. No peritonitis  Musculoskeletal: Normal range of motion.  Neurological: He is alert and oriented to person, place, and time.  Skin: Skin is warm and dry.  Psychiatric: He has a normal mood and affect. His behavior is normal.       Assessment:     Very complicated presentation. I believe he has several problems. 1) Urinary problem. This is being evaluated by a urologist 2) Possible C. Diff colitis. Needs to be checked for toxin again 3) Possible CBD and Pancreatic duct stones although CT and U/S did not show gallstones. With the pattern of his pain I can not guarantee that taking his gallbladder out will fix his pain or the stones in his pancreatic duct. My opinion is that he be evaluated by a Hepatobiliary Specialist. We will refer him to Dr. Marilynn Rail at [facility]. 4) Factor V leiden deficiency. Continue coumadin    Plan:     Will refer to [facility]

## 2011-12-26 ENCOUNTER — Other Ambulatory Visit (INDEPENDENT_AMBULATORY_CARE_PROVIDER_SITE_OTHER): Payer: Self-pay | Admitting: General Surgery

## 2011-12-26 ENCOUNTER — Telehealth (INDEPENDENT_AMBULATORY_CARE_PROVIDER_SITE_OTHER): Payer: Self-pay | Admitting: General Surgery

## 2011-12-26 NOTE — Telephone Encounter (Signed)
Alex Hoffman with Dr Graylon Good office is calling to check status of scheduling surgery?

## 2011-12-26 NOTE — Telephone Encounter (Signed)
DR. Carolynne Edouard ENTERED ORDERS INTO EPIC THIS AM/ ORDERS TO SURGERY SCHEDULING THIS AM/GY

## 2011-12-27 ENCOUNTER — Other Ambulatory Visit: Payer: Self-pay | Admitting: Urology

## 2012-01-11 ENCOUNTER — Telehealth (INDEPENDENT_AMBULATORY_CARE_PROVIDER_SITE_OTHER): Payer: Self-pay | Admitting: General Surgery

## 2012-01-11 ENCOUNTER — Emergency Department (HOSPITAL_COMMUNITY): Payer: Self-pay

## 2012-01-11 ENCOUNTER — Encounter (HOSPITAL_COMMUNITY): Payer: Self-pay | Admitting: Emergency Medicine

## 2012-01-11 ENCOUNTER — Emergency Department (HOSPITAL_COMMUNITY)
Admission: EM | Admit: 2012-01-11 | Discharge: 2012-01-11 | Disposition: A | Discharge status: Home / Self Care | Payer: Self-pay | Attending: Emergency Medicine | Admitting: Emergency Medicine

## 2012-01-11 DIAGNOSIS — Z86711 Personal history of pulmonary embolism: Secondary | ICD-10-CM | POA: Insufficient documentation

## 2012-01-11 DIAGNOSIS — R109 Unspecified abdominal pain: Secondary | ICD-10-CM | POA: Insufficient documentation

## 2012-01-11 DIAGNOSIS — R112 Nausea with vomiting, unspecified: Secondary | ICD-10-CM | POA: Insufficient documentation

## 2012-01-11 DIAGNOSIS — G8929 Other chronic pain: Secondary | ICD-10-CM | POA: Insufficient documentation

## 2012-01-11 DIAGNOSIS — I10 Essential (primary) hypertension: Secondary | ICD-10-CM | POA: Insufficient documentation

## 2012-01-11 DIAGNOSIS — K219 Gastro-esophageal reflux disease without esophagitis: Secondary | ICD-10-CM | POA: Insufficient documentation

## 2012-01-11 DIAGNOSIS — K5900 Constipation, unspecified: Secondary | ICD-10-CM | POA: Insufficient documentation

## 2012-01-11 DIAGNOSIS — D6859 Other primary thrombophilia: Secondary | ICD-10-CM | POA: Insufficient documentation

## 2012-01-11 DIAGNOSIS — F17200 Nicotine dependence, unspecified, uncomplicated: Secondary | ICD-10-CM | POA: Insufficient documentation

## 2012-01-11 DIAGNOSIS — R300 Dysuria: Secondary | ICD-10-CM | POA: Insufficient documentation

## 2012-01-11 LAB — CBC
Hemoglobin: 13.1 g/dL (ref 13.0–17.0)
MCH: 29 pg (ref 26.0–34.0)
MCV: 86.3 fL (ref 78.0–100.0)
RBC: 4.52 MIL/uL (ref 4.22–5.81)
WBC: 9.3 10*3/uL (ref 4.0–10.5)

## 2012-01-11 LAB — URINALYSIS, ROUTINE W REFLEX MICROSCOPIC
Bilirubin Urine: NEGATIVE
Glucose, UA: NEGATIVE mg/dL
Hgb urine dipstick: NEGATIVE
Specific Gravity, Urine: 1.01 (ref 1.005–1.030)
pH: 6 (ref 5.0–8.0)

## 2012-01-11 LAB — DIFFERENTIAL
Eosinophils Absolute: 0.2 10*3/uL (ref 0.0–0.7)
Eosinophils Relative: 2 % (ref 0–5)
Lymphocytes Relative: 31 % (ref 12–46)
Lymphs Abs: 2.9 10*3/uL (ref 0.7–4.0)
Monocytes Relative: 7 % (ref 3–12)

## 2012-01-11 LAB — LIPASE, BLOOD: Lipase: 16 U/L (ref 11–59)

## 2012-01-11 LAB — COMPREHENSIVE METABOLIC PANEL
ALT: 19 U/L (ref 0–53)
Alkaline Phosphatase: 105 U/L (ref 39–117)
BUN: 6 mg/dL (ref 6–23)
CO2: 33 mEq/L — ABNORMAL HIGH (ref 19–32)
Calcium: 9.7 mg/dL (ref 8.4–10.5)
GFR calc Af Amer: >90 mL/min (ref >90)
GFR calc non Af Amer: >90 mL/min (ref >90)
Glucose, Bld: 93 mg/dL (ref 70–99)
Potassium: 4.2 mEq/L (ref 3.5–5.1)
Total Protein: 7.5 g/dL (ref 6.0–8.3)

## 2012-01-11 LAB — PROTIME-INR
INR: 1.04 (ref 0.00–1.49)
Prothrombin Time: 13.8 seconds (ref 11.6–15.2)

## 2012-01-11 MED ORDER — ONDANSETRON HCL 4 MG/2ML IJ SOLN
4.0000 mg | Freq: Once | INTRAMUSCULAR | Status: AC
  Administered 2012-01-11: 4 mg via INTRAVENOUS
  Filled 2012-01-11: qty 2

## 2012-01-11 MED ORDER — HYDROMORPHONE HCL PF 1 MG/ML IJ SOLN
1.0000 mg | Freq: Once | INTRAMUSCULAR | Status: AC
  Administered 2012-01-11: 1 mg via INTRAVENOUS
  Filled 2012-01-11: qty 1

## 2012-01-11 MED ORDER — POLYETHYLENE GLYCOL 3350 17 GM/SCOOP PO POWD: 17.0000 g | Freq: Every day | ORAL | Status: DC

## 2012-01-11 NOTE — ED Provider Notes (Signed)
History     CSN: [id number]  Arrival date & time 01/11/12  1245   First MD Initiated Contact with Patient 01/11/12 1502      Chief Complaint  Patient presents with  . Abdominal Pain    (Consider location/radiation/quality/duration/timing/severity/associated sxs/prior treatment) Patient is a 51 y.o. male presenting with abdominal pain. The history is provided by the patient.  Abdominal Pain The primary symptoms of the illness include abdominal pain, nausea, vomiting, diarrhea and dysuria. The primary symptoms of the illness do not include shortness of breath.  Symptoms associated with the illness do not include back pain.   acute on chronic abdominal pain. He is scheduled to have his gallbladder out on the 27th. He's continued to have pain. He also is posterior bladder surgery at that time. Dr. Carolynne Edouard is to do a gallbladder surgery and Dr. Annabell Howells is to do a bladder surgery. He has no relief his pain medicine home. He is continues to have nausea vomiting diarrhea. He states that the diarrhea is decreased compared to normal. The pain is uncontrolled by his medications at home. He states he called the surgeons and they told him come in for evaluation.  Past Medical History  Diagnosis Date  . Pulmonary embolism 12/2010  . Cystic thyroid nodule     left  . HTN (hypertension)   . Heterozygous factor V Leiden mutation 12/2010  . Pancreatitis 05/2011    EUS Dr Christella Hartigan 06/09/11-> dilated main pancreatic duct in HOP, 2-3 filling defects distal pancreatic duct, peripancreatic fluid resolved  . C. difficile colitis 01/2011?  Marland Kitchen Arthritis   . Clotting disorder   . GERD (gastroesophageal reflux disease)   . Chills   . Fever   . Weight loss   . Hearing loss   . Nasal congestion   . Leg swelling   . Palpitations   . Abdominal distension   . Abdominal pain   . Nausea & vomiting   . Rectal pain   . Difficulty urinating   . Arthritis pain   . Headache   . Weakness   . Bruises easily   . Rash      Past Surgical History  Procedure Date  . Neck surgery     C5/C6 fusion  . Knee surgery     arthroscopy, left knee  . Femur fracture surgery   . Nerve surgery     right thumb  . Colonoscopy 01/2011    Dr. Zenon Mayo at [facility]-->normal  . Esophagogastroduodenoscopy 01/2011    Dr. Zenon Mayo at [facility]-->bile reflux in stomach-->patient c/o inadequate conscious sedation (Fentanyl 100mg /Versed 8mg )  . Ercp 07/2011    Dr. Logan Bores [facility]-> pancreatic enterotomy, pancreatic duct stones removed, bile duct not manipulated, no stents    Family History  Problem Relation Age of Onset  . GI problems Brother   . Colon cancer Neg Hx   . Liver disease Neg Hx   . GI problems      stomach and lung cancer, aunts/uncles  . Heart attack Brother     before age of 10  . Stroke Brother   . Hypertension Mother   . Hypertension Father   . Cancer Maternal Aunt     colon/pancreas/lung/stomach  . Heart disease Maternal Uncle   . Cancer Maternal Grandfather     lung    History  Substance Use Topics  . Smoking status: Current Some Day Smoker -- 1.0 packs/day    Types: Cigarettes    Last Attempt to Quit: 01/27/2011  .  Smokeless tobacco: Not on file   Comment: smoking one pack daily/ took self off of patch  . Alcohol Use: No     None since hospitalized with pancreatitis in 05/2011. Prior to that, 1-2 wine coolers few days a week. Denies heavy or daily consumption.      Review of Systems  Constitutional: Negative for activity change and appetite change.  HENT: Negative for neck stiffness.   Eyes: Negative for pain.  Respiratory: Negative for chest tightness and shortness of breath.   Cardiovascular: Negative for chest pain and leg swelling.  Gastrointestinal: Positive for nausea, vomiting, abdominal pain and diarrhea.  Genitourinary: Positive for dysuria. Negative for flank pain.  Musculoskeletal: Negative for back pain.  Skin: Negative for rash.  Neurological: Negative for weakness, numbness  and headaches.  Psychiatric/Behavioral: Negative for behavioral problems.    Allergies  Review of patient's allergies indicates no known allergies.  Home Medications   Current Outpatient Rx  Name Route Sig Dispense Refill  . AMLODIPINE BESYLATE 10 MG PO TABS Oral Take 10 mg by mouth daily.      Marland Kitchen BENZOCAINE 10 % MT GEL Mouth/Throat Use as directed 1 application in the mouth or throat as needed. Apply to tooth ache    . DIAZEPAM 5 MG PO TABS Oral Take 5 mg by mouth every 6 (six) hours as needed. Takes two tablets at bedtime     . ENOXAPARIN SODIUM 120 MG/0.8ML [place] SOLN Subcutaneous Inject 120 mg into the skin daily. Take for dental procedure. Stop taking coumadin as directed by physician.    Marland Kitchen ESOMEPRAZOLE MAGNESIUM 40 MG PO CPDR Oral Take 40 mg by mouth 2 (two) times daily as needed. For anxiety or sleep    . IBUPROFEN 200 MG PO TABS Oral Take 600 mg by mouth every 6 (six) hours as needed. For pain    . METHADONE HCL 10 MG PO TABS Oral Take 50 mg by mouth 3 (three) times daily. Takes five pills three times daily    . METOCLOPRAMIDE HCL 10 MG PO TABS Oral Take 10 mg by mouth daily. Takes one tablet at bedtime (prescribed qid)    . OXYCODONE HCL 30 MG PO TABS Oral Take 30 mg by mouth 4 (four) times daily.      Marland Kitchen PENICILLIN V POTASSIUM 500 MG PO TABS Oral Take 500 mg by mouth 2 (two) times daily.    Marland Kitchen PHENAZOPYRIDINE HCL 100 MG PO TABS Oral Take 200 mg by mouth daily.    Marland Kitchen PROCHLORPERAZINE MALEATE 10 MG PO TABS Oral Take 5 mg by mouth 2 (two) times daily.    . WARFARIN SODIUM 10 MG PO TABS Oral Take 5 mg by mouth daily.    Marland Kitchen ZOLPIDEM TARTRATE 10 MG PO TABS Oral Take 10 mg by mouth at bedtime as needed.      Marland Kitchen POLYETHYLENE GLYCOL 3350 PO POWD Oral Take 17 g by mouth daily. 255 g 0    BP 138/85  Pulse 64  Temp(Src) 97.9 F (36.6 C) (Oral)  Resp 18  SpO2 100%  Physical Exam  Nursing note and vitals reviewed. Constitutional: He is oriented to person, place, and time. He appears  well-developed and well-nourished.  HENT:  Head: Normocephalic and atraumatic.  Eyes: EOM are normal. Pupils are equal, round, and reactive to light.  Neck: Normal range of motion. Neck supple.  Cardiovascular: Normal rate, regular rhythm and normal heart sounds.   No murmur heard. Pulmonary/Chest: Effort normal and breath sounds normal.  Abdominal: Soft. Bowel sounds are normal. He exhibits no distension and no mass. There is tenderness. There is no rebound and no guarding.       Diffuse tenderness without rebound or guarding  Musculoskeletal: Normal range of motion. He exhibits no edema.  Neurological: He is alert and oriented to person, place, and time. No cranial nerve deficit.  Skin: Skin is warm and dry.  Psychiatric: He has a normal mood and affect.    ED Course  Procedures (including critical care time)  Labs Reviewed  COMPREHENSIVE METABOLIC PANEL - Abnormal; Notable for the following:    CO2 33 (*)    All other components within normal limits  URINALYSIS, ROUTINE W REFLEX MICROSCOPIC  CBC  DIFFERENTIAL  LIPASE, BLOOD  PROTIME-INR   Dg Abd Acute W/chest  01/11/2012  *RADIOLOGY REPORT*  Clinical Data: Abdominal pain  ACUTE ABDOMEN SERIES (ABDOMEN 2 VIEW & CHEST 1 VIEW)  Comparison: 09/07/2011  Findings: Heart size appears normal.  No pleural effusion or pulmonary edema.  No airspace consolidation identified.  Lungs appear hyperinflated with slightly coarsened interstitial markings.  The bowel gas pattern appears nonobstructed.  No dilated loops of small bowel or air-fluid levels identified.  Moderate gas and stool identified throughout the colon up to the rectum.  IMPRESSION:  1.  Nonobstructive bowel gas pattern.  Moderate stool burden throughout the colon may indicate constipation. 2.  No acute cardiopulmonary abnormalities.  Original Report Authenticated By: Rosealee Albee, M.D.     1. Abdominal pain   2. Constipation       MDM  Acute on chronic abdominal pain.  Where it is reassuring. Patient feels somewhat better after treatment. Discussed with general surgery. Patient is hoping to his surgery to go. That will not be done now. He will be discharged to followup. I will add some medicines to help with his constipation        Juliet Rude. Rubin Payor, MD 01/11/12 [phone]

## 2012-01-11 NOTE — ED Notes (Signed)
Gallstones and is to have surgery in 2 wks pt has pain to lower abd bladdersurgery also states that he has swelling to lower abd has been able to void today, hx of pancetitis also,

## 2012-01-11 NOTE — Telephone Encounter (Signed)
Pt's fiancee, Staci Acosta, calling to report Alex Hoffman is "in extreme pain and his entire lower abdomen is swollen."  He is doubled-over in pain.  I advised her to take him to [facility] ED now for evaluation.  She states he is scheduled for surgery 01/25/12 for chole and bladder (urologist,) but this is all new today.

## 2012-01-11 NOTE — Discharge Instructions (Signed)

## 2012-01-11 NOTE — ED Notes (Signed)
Patient aware of need for urine specimen. Patient unable to void at this time. Patient given urinal. Encouraged to call for assistance if needed.   

## 2012-01-13 ENCOUNTER — Encounter (HOSPITAL_COMMUNITY): Payer: Self-pay | Admitting: Pharmacy Technician

## 2012-01-17 ENCOUNTER — Encounter (HOSPITAL_COMMUNITY)
Admission: RE | Admit: 2012-01-17 | Discharge: 2012-01-17 | Disposition: A | Discharge status: Home / Self Care | Payer: Self-pay | Source: Ambulatory Visit | Attending: General Surgery | Admitting: General Surgery

## 2012-01-17 ENCOUNTER — Encounter (HOSPITAL_COMMUNITY): Payer: Self-pay

## 2012-01-17 HISTORY — DX: Anemia, unspecified: D64.9

## 2012-01-17 HISTORY — DX: Paresthesia of skin: R20.2

## 2012-01-17 HISTORY — DX: Sleep apnea, unspecified: G47.30

## 2012-01-17 HISTORY — DX: Paresthesia of skin: R20.0

## 2012-01-17 HISTORY — DX: Pneumonia, unspecified organism: J18.9

## 2012-01-17 HISTORY — DX: Abnormal electrocardiogram (ECG) (EKG): R94.31

## 2012-01-17 LAB — CBC
HCT: 40.6 % (ref 39.0–52.0)
MCH: 29.5 pg (ref 26.0–34.0)
MCV: 86.8 fL (ref 78.0–100.0)
Platelets: 341 10*3/uL (ref 150–400)
RDW: 13.2 % (ref 11.5–15.5)
WBC: 9.9 10*3/uL (ref 4.0–10.5)

## 2012-01-17 LAB — SURGICAL PCR SCREEN: MRSA, PCR: NEGATIVE

## 2012-01-17 LAB — COMPREHENSIVE METABOLIC PANEL
AST: 25 U/L (ref 0–37)
Albumin: 3.9 g/dL (ref 3.5–5.2)
BUN: 6 mg/dL (ref 6–23)
Calcium: 9.7 mg/dL (ref 8.4–10.5)
Chloride: 99 mEq/L (ref 96–112)
Creatinine, Ser: 0.78 mg/dL (ref 0.50–1.35)
GFR calc non Af Amer: >90 mL/min (ref >90)
Total Bilirubin: 0.3 mg/dL (ref 0.3–1.2)

## 2012-01-17 LAB — DIFFERENTIAL
Basophils Absolute: 0 10*3/uL (ref 0.0–0.1)
Basophils Relative: 0 % (ref 0–1)
Lymphocytes Relative: 44 % (ref 12–46)
Monocytes Absolute: 0.5 10*3/uL (ref 0.1–1.0)
Monocytes Relative: 5 % (ref 3–12)
Neutro Abs: 4.7 10*3/uL (ref 1.7–7.7)
Neutrophils Relative %: 47 % (ref 43–77)

## 2012-01-17 LAB — PROTIME-INR: INR: 1.65 — ABNORMAL HIGH (ref 0.00–1.49)

## 2012-01-17 NOTE — Pre-Procedure Instructions (Addendum)
Chest xray 2 view 5-8--2012 [facility] on chart ekg 07-12-2011 [facility] on chart ekg left bundle branch block 05-14-2009 [facility] hospital received and placed on pt chart

## 2012-01-17 NOTE — Patient Instructions (Signed)
20 Alex Hoffman  01/17/2012   Your procedure is scheduled on:  01-25-12  Report to [facility] at 0615  AM.  Call this number if you have problems the morning of surgery: [phone]   Remember:   Do not eat food or drink liquids:After Midnight.  .  Take these medicines the morning of surgery with A SIP OF WATER: norvasc, valium if needed, nexium, methadone, oxycodone   Do not wear jewelry or make up.  Do not wear lotions, powders, or perfumes.Do not wear deodorant.    Do not bring valuables to the hospital.  Contacts, dentures or bridgework may not be worn into surgery.  Leave suitcase in the car. After surgery it may be brought to your room.  For patients admitted to the hospital, checkout time is 11:00 AM the day of discharge.     Special Instructions: [employer] forces training and education officer Wash: 1/2 bottle night before surgery and 1/2 bottle morning of surgery.neck down avoid private area   Please read over the following fact sheets that you were given: MRSA Information  Cain S[facility] pre op nurse phone number [phone], call if needed

## 2012-01-24 NOTE — H&P (Signed)
ms  1. Abdominal Pain In Multiple Locations 789.09 2. Chronic Pain 338.29 3. Feelings Of Urinary Urgency 788.63 4. Nocturia 788.43 5. Penile Pain 6. Prostatitis 601.9 7. Testicular Pain 608.9 8. Urinary Stream Is Smaller 788.62 9. Urinary Stream Starts And Stops 788.61  History of Present Illness     Alex Hoffman is a 51 yo WM who is sent in consultation by Dr. Cleone Slim for chronic prostatitis.  He has a complicated medical history over the past year with pancreatitis x 4 and has been hospitalized about 13 x.  He has been found to have Factor V Leiden deficiency and had a DVT with PE's and is currently on Coumadin.  He has a history of prostatitis diagnosed at [facility] in 4/12 and has had several rounds of antibiotics.  He just completed a 7 week course of doxycycline under Dr. Baldo Ash in December and had been on Cipro and flagyl previously.   He is still has a lot of pain in his lower abdomen and back.  He has pain into his legs and perineum.   He has pain with voiding and has hesitancy with intermittancy and he has to strain to void.  The stream is very weak.  He has nocturia q52min and during the day he has frequency and urgency with UUI.  He has had hematuria.  He also has loose stools and pain with a BM and he says he has had some blood in his bowel movements.  He is to have a cholecystectomy on 2/18.  He had a CT of the Abd/Pelvis and an Abd [place] in 9/12 and no stones or other renal abnormalities were noted.  He has been on Rapaflo which helped his symptoms but he stopped in mid December because he thought it was irritating his stomach.  He is on pyridium which helps his voiding pain a little bit.  He still gets tears in his eyes when he voids.  He had no problems since 01/27/11.  He is currently on methadone and oxycodone for his pain.  He has tried elavil but he was unable to take it because of the cognitive side effects.  He didn't tolerate flexeril or gabapentin.  He did get some relief with tizanidine during  one of his hospitalization.   Past Medical History Problems  1. History of  Anxiety (Symptom) 300.00 2. History of  Arthritis V13.4 3. History of  Chronic Reflux Esophagitis 530.11 4. History of  Hypertension 401.9 5. History of  Pancreatitis 577.9 6. History of  Prostatitis 601.9 7. History of  Pulmonary Embolism V12.55 8. History of  Pulmonary Embolism V12.55 9. 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 History of  Cervical Vertebral Fusion 2. History of  Femur Repair Right 3. History of  Hand Surgery Left 4. History of  Knee Surgery Left 5. History of  Spine Repair  Current Meds 1. Ambien 5 MG Oral Tablet; Therapy: (Recorded:01Feb2013) to 2. Coumadin 10 MG Oral Tablet; Therapy: (Recorded:01Feb2013) to 3. Methadone HCl 10 MG Oral Tablet; Therapy: (Recorded:01Feb2013) to 4. NexIUM 40 MG Oral Packet; Therapy: (Recorded:01Feb2013) to 5. Norvasc 10 MG Oral Tablet; Therapy: (Recorded:01Feb2013) to 6. OxyCODONE HCl 15 MG Oral Tablet; Therapy: (Recorded:01Feb2013) to 7. Pyridium TABS; Therapy: (Recorded:01Feb2013) to 8. Rapaflo CAPS; Therapy: (Recorded:01Feb2013) to 9. Reglan TABS; Therapy: (Recorded:01Feb2013) to 10. Valium TABS; Therapy: (Recorded:01Feb2013) to 11. Zenpep 20000 UNIT Oral Capsule Delayed Release Particles; Therapy: (Recorded:01Feb2013)   to  Allergies Medication  1. No Known Drug Allergies  Family History Problems  1. Family history of  Family Health Status Number Of Children  Social History Problems    Caffeine Use   Former Smoker V15.82   Marital History - Divorced V61.03   Never Drank Alcohol  Review of Systems Genitourinary, constitutional, skin, eye, otolaryngeal, hematologic/lymphatic, cardiovascular, pulmonary, endocrine, musculoskeletal, gastrointestinal, neurological and psychiatric system(s) were reviewed and pertinent findings if present are noted.  Genitourinary:  urinary frequency, feelings of urinary urgency, dysuria, nocturia, incontinence, difficulty starting the urinary stream, weak urinary stream, urinary stream starts and stops, incomplete emptying of bladder and hematuria.  Gastrointestinal: nausea, vomiting, abdominal pain, heartburn, diarrhea and constipation.  Constitutional: fever, night sweats, feeling tired (fatigue) and recent weight loss.  Integumentary: skin rash/lesion and pruritus.  Eyes: blurred vision and diplopia (not recent).  ENT: sinus problems.  Hematologic/Lymphatic: a tendency to easily bruise and swollen glands.  Cardiovascular: chest pain and leg swelling.  Respiratory: shortness of breath and cough (with anxiety).  Endocrine: polydipsia.  Musculoskeletal: back pain and joint pain.  Neurological: headache, dizziness and numbness (on both thighs laterally).  Psychiatric: anxiety.    Vitals Vital Signs [Data Includes: Last 1 Day]  01Feb2013 04:15PM  BMI Calculated: 24.11 BSA Calculated: 2.03 Height: 6 ft  Weight: 178 lb  Blood Pressure: 163 / 88 Temperature: 97.5 F Heart Rate: 74  Physical Exam Constitutional: Well nourished and well developed . No acute distress.  ENT:. The ears and nose are normal in appearance.  Neck: The appearance of the neck is normal and no neck mass is present.  Pulmonary: No respiratory distress and normal respiratory rhythm and effort.  Cardiovascular: Heart rate and rhythm are normal . No peripheral edema.  Abdomen: No masses are palpated. Moderate. Tenderness is present diffusely throughout the abdomen. No CVA tenderness. No hernias are palpable. No hepatosplenomegaly noted.  Rectal: Rectal exam demonstrates rectal tenderness, but normal sphincter tone and no masses. Estimated prostate size is 1+. The prostate has no nodularity and is tender. The left seminal vesicle is nonpalpable. The right seminal vesicle is nonpalpable. The perineum is normal on inspection.  Genitourinary:  Examination of the penis demonstrates tenderness on palpation (of the urethra), but no discharge, no masses, no lesions and a normal meatus. The scrotum is without lesions. The right epididymis is palpably normal and non-tender. The left epididymis is palpably normal and non-tender. The right testis is tender, but without masses. The left testis is tender, but without masses.  Lymphatics: The supraclavicular, axillary, femoral and inguinal nodes are not enlarged or tender.  Skin: Normal skin turgor, no visible rash and no visible skin lesions.  Neuro/Psych:. Mood and affect are appropriate. He is tender diffusely in his abdomen, back, genitalia, perineum and prostate area. He has marked pain with bilateral straight leg raising.    Results/Data Urine [Data Includes: Last 1 Day]   01Feb2013  COLOR YELLOW   APPEARANCE CLEAR   SPECIFIC GRAVITY 1.020   pH 6.0   GLUCOSE NEG mg/dL  BILIRUBIN NEG   KETONE NEG mg/dL  BLOOD NEG   PROTEIN NEG mg/dL  UROBILINOGEN 0.2 mg/dL  NITRITE NEG   LEUKOCYTE ESTERASE NEG    Old records or history reviewed: I have reviewed records from Dr. Cleone Slim and also a synopsis of his history from the patient himself.  The following images/tracing/specimen were independently visualized:  I have reviewed a KUB from 11/12 and a CT AP from 10/12. There were no urinary findings  and his lumbar spine is unremarkable.  Will request old records/history: I will request additional records from [facility] and Dr. Michiel Cowboy office.  PVR: Ultrasound PVR 50 ml.    Assessment Assessed  1. Prostatitis 601.9 2. Chronic Pain 338.29 3. Abdominal Pain In Multiple Locations 789.09 4. Nocturia 788.43 5. Urinary Stream Is Smaller 788.62 6. Feelings Of Urinary Urgency 788.63   He has a complicated situation with diffuse abdominal, back, genital, perineal, prostatic and upper leg pain.  He has no evidence of stones or other GU abnormalities on CT and his lumbar spine is unremarkable. His prostate is  tender, but I don't believe he truly has prostatitis.   He has voiding symptoms and bladder pain that is worrisome for IC/CPPS and appears to have highly up regulated pain receptors.   Plan Chronic Pain (338.29)  1. Follow-up Schedule Surgery Office  Follow-up  Done: 01Feb2013 Chronic Pain (338.29), Abdominal Pain In Multiple Locations (789.09)  2. TiZANidine HCl 4 MG Oral Tablet; TAKE 1 TABLET 3 times daily PRN pain; Therapy: 01Feb2013  to (Last Rx:01Feb2013) Prostatitis (601.9)  3. UA With REFLEX  Done: 01Feb2013 04:02PM Urinary Stream Is Smaller (788.62)  4. Complex Uroflowmetry  Requested for: 05Feb2013   I am going to put him on Tizanidine which he reports has helped in the past. I am going to have him resume the Rapaflo which has helped. I would like to have him return next week for a flowrate to better assess his voiding function. He is to have a cholecystectomy by Dr. Carolynne Edouard the week of 2/18.  I would like to see if I could do cystoscopy with hydrodistention of the bladder to assess for IC at the same anesthesia.  Tuesday 2/19 would be the best day for me.  I reviewed the risks of bleeding, infection, voiding difficulty and bladder wall injury with the patient. He would probably benefit from psychological counselling to help him cope better with his symptoms and may also benefit from physical therapy to help improve his range of motion. I don't believe there is going to be a quick fix for his symptoms.

## 2012-01-25 ENCOUNTER — Encounter (HOSPITAL_COMMUNITY)
Admission: RE | Disposition: A | Discharge status: Home / Self Care | Payer: Self-pay | Source: Ambulatory Visit | Attending: General Surgery

## 2012-01-25 ENCOUNTER — Telehealth: Payer: Self-pay | Admitting: Gastroenterology

## 2012-01-25 ENCOUNTER — Encounter (HOSPITAL_COMMUNITY): Payer: Self-pay | Admitting: *Deleted

## 2012-01-25 ENCOUNTER — Ambulatory Visit (HOSPITAL_COMMUNITY)
Admission: RE | Admit: 2012-01-25 | Discharge: 2012-01-27 | Disposition: A | Discharge status: Home / Self Care | Payer: Self-pay | Source: Ambulatory Visit | Attending: General Surgery | Admitting: General Surgery

## 2012-01-25 ENCOUNTER — Ambulatory Visit (HOSPITAL_COMMUNITY): Payer: Self-pay

## 2012-01-25 ENCOUNTER — Encounter (HOSPITAL_COMMUNITY): Payer: Self-pay | Admitting: Anesthesiology

## 2012-01-25 ENCOUNTER — Ambulatory Visit (HOSPITAL_COMMUNITY): Payer: Self-pay | Admitting: Anesthesiology

## 2012-01-25 DIAGNOSIS — Z86711 Personal history of pulmonary embolism: Secondary | ICD-10-CM | POA: Insufficient documentation

## 2012-01-25 DIAGNOSIS — R3911 Hesitancy of micturition: Secondary | ICD-10-CM | POA: Insufficient documentation

## 2012-01-25 DIAGNOSIS — I10 Essential (primary) hypertension: Secondary | ICD-10-CM | POA: Insufficient documentation

## 2012-01-25 DIAGNOSIS — R351 Nocturia: Secondary | ICD-10-CM | POA: Insufficient documentation

## 2012-01-25 DIAGNOSIS — R3989 Other symptoms and signs involving the genitourinary system: Secondary | ICD-10-CM | POA: Insufficient documentation

## 2012-01-25 DIAGNOSIS — R1310 Dysphagia, unspecified: Secondary | ICD-10-CM

## 2012-01-25 DIAGNOSIS — K811 Chronic cholecystitis: Secondary | ICD-10-CM | POA: Insufficient documentation

## 2012-01-25 DIAGNOSIS — K219 Gastro-esophageal reflux disease without esophagitis: Secondary | ICD-10-CM | POA: Insufficient documentation

## 2012-01-25 DIAGNOSIS — G8929 Other chronic pain: Secondary | ICD-10-CM | POA: Insufficient documentation

## 2012-01-25 DIAGNOSIS — R310 Gross hematuria: Secondary | ICD-10-CM

## 2012-01-25 DIAGNOSIS — R109 Unspecified abdominal pain: Secondary | ICD-10-CM

## 2012-01-25 DIAGNOSIS — R945 Abnormal results of liver function studies: Secondary | ICD-10-CM

## 2012-01-25 DIAGNOSIS — K625 Hemorrhage of anus and rectum: Secondary | ICD-10-CM

## 2012-01-25 DIAGNOSIS — E876 Hypokalemia: Secondary | ICD-10-CM

## 2012-01-25 DIAGNOSIS — Z01812 Encounter for preprocedural laboratory examination: Secondary | ICD-10-CM | POA: Insufficient documentation

## 2012-01-25 DIAGNOSIS — R197 Diarrhea, unspecified: Secondary | ICD-10-CM

## 2012-01-25 DIAGNOSIS — F411 Generalized anxiety disorder: Secondary | ICD-10-CM | POA: Insufficient documentation

## 2012-01-25 DIAGNOSIS — Z86718 Personal history of other venous thrombosis and embolism: Secondary | ICD-10-CM | POA: Insufficient documentation

## 2012-01-25 DIAGNOSIS — K859 Acute pancreatitis without necrosis or infection, unspecified: Secondary | ICD-10-CM

## 2012-01-25 DIAGNOSIS — R3916 Straining to void: Secondary | ICD-10-CM | POA: Insufficient documentation

## 2012-01-25 HISTORY — PX: CYSTOSCOPY WITH HYDRODISTENSION AND BIOPSY: SHX5127

## 2012-01-25 HISTORY — PX: CHOLECYSTECTOMY: SHX55

## 2012-01-25 LAB — PROTIME-INR: Prothrombin Time: 13.9 seconds (ref 11.6–15.2)

## 2012-01-25 SURGERY — LAPAROSCOPIC CHOLECYSTECTOMY WITH INTRAOPERATIVE CHOLANGIOGRAM
Anesthesia: General | Site: Abdomen | Wound class: Clean Contaminated

## 2012-01-25 MED ORDER — GLYCOPYRROLATE 0.2 MG/ML IJ SOLN
INTRAMUSCULAR | Status: DC | PRN
  Administered 2012-01-25: 0.2 mg via INTRAVENOUS
  Administered 2012-01-25: .8 mg via INTRAVENOUS

## 2012-01-25 MED ORDER — LIDOCAINE HCL (CARDIAC) 20 MG/ML IV SOLN
INTRAVENOUS | Status: DC | PRN
  Administered 2012-01-25: 80 mg via INTRAVENOUS

## 2012-01-25 MED ORDER — 0.9 % SODIUM CHLORIDE (POUR BTL) OPTIME
TOPICAL | Status: DC | PRN
  Administered 2012-01-25: 1000 mL

## 2012-01-25 MED ORDER — IOHEXOL 300 MG/ML  SOLN
INTRAMUSCULAR | Status: DC | PRN
  Administered 2012-01-25: 8.5 mL via INTRAVENOUS

## 2012-01-25 MED ORDER — PROCHLORPERAZINE MALEATE 5 MG PO TABS
5.0000 mg | ORAL_TABLET | Freq: Three times a day (TID) | ORAL | Status: DC
  Administered 2012-01-25 – 2012-01-27 (×5): 5 mg via ORAL
  Filled 2012-01-25 (×8): qty 1

## 2012-01-25 MED ORDER — MORPHINE SULFATE 4 MG/ML IJ SOLN
4.0000 mg | INTRAMUSCULAR | Status: DC | PRN
  Administered 2012-01-25 – 2012-01-26 (×10): 4 mg via INTRAVENOUS
  Filled 2012-01-25 (×8): qty 1

## 2012-01-25 MED ORDER — CIPROFLOXACIN IN D5W 400 MG/200ML IV SOLN
INTRAVENOUS | Status: AC
  Filled 2012-01-25: qty 200

## 2012-01-25 MED ORDER — DIAZEPAM 5 MG PO TABS
5.0000 mg | ORAL_TABLET | Freq: Four times a day (QID) | ORAL | Status: DC | PRN
  Administered 2012-01-25 – 2012-01-26 (×2): 5 mg via ORAL
  Filled 2012-01-25 (×3): qty 1

## 2012-01-25 MED ORDER — PANTOPRAZOLE SODIUM 40 MG PO TBEC
80.0000 mg | DELAYED_RELEASE_TABLET | Freq: Two times a day (BID) | ORAL | Status: DC
  Administered 2012-01-25 – 2012-01-27 (×4): 80 mg via ORAL
  Filled 2012-01-25 (×5): qty 2

## 2012-01-25 MED ORDER — KCL IN DEXTROSE-NACL 20-5-0.9 MEQ/L-%-% IV SOLN
INTRAVENOUS | Status: DC
  Administered 2012-01-25 – 2012-01-26 (×2): via INTRAVENOUS
  Administered 2012-01-26: 1000 mL via INTRAVENOUS
  Administered 2012-01-27: 06:00:00 via INTRAVENOUS
  Filled 2012-01-25 (×6): qty 1000

## 2012-01-25 MED ORDER — BUPIVACAINE-EPINEPHRINE PF 0.25-1:200000 % IJ SOLN
INTRAMUSCULAR | Status: AC
  Filled 2012-01-25: qty 30

## 2012-01-25 MED ORDER — METHADONE HCL 10 MG PO TABS
50.0000 mg | ORAL_TABLET | Freq: Three times a day (TID) | ORAL | Status: DC
  Administered 2012-01-25 – 2012-01-27 (×6): 50 mg via ORAL
  Filled 2012-01-25 (×3): qty 5
  Filled 2012-01-25: qty 1
  Filled 2012-01-25: qty 5
  Filled 2012-01-25: qty 4
  Filled 2012-01-25: qty 5

## 2012-01-25 MED ORDER — CEFAZOLIN SODIUM 1-5 GM-% IV SOLN: INTRAVENOUS | Status: DC | PRN

## 2012-01-25 MED ORDER — MORPHINE SULFATE 4 MG/ML IJ SOLN
6.0000 mg | INTRAMUSCULAR | Status: DC | PRN
  Administered 2012-01-25 – 2012-01-26 (×2): 6 mg via INTRAVENOUS
  Filled 2012-01-25 (×3): qty 1

## 2012-01-25 MED ORDER — NEOSTIGMINE METHYLSULFATE 1 MG/ML IJ SOLN
INTRAMUSCULAR | Status: DC | PRN
  Administered 2012-01-25: 2 mg via INTRAVENOUS

## 2012-01-25 MED ORDER — CEFAZOLIN SODIUM-DEXTROSE 2-3 GM-% IV SOLR
2.0000 g | INTRAVENOUS | Status: AC
  Administered 2012-01-25: 2 g via INTRAVENOUS

## 2012-01-25 MED ORDER — PROPOFOL 10 MG/ML IV EMUL
INTRAVENOUS | Status: DC | PRN
  Administered 2012-01-25: 200 mg via INTRAVENOUS

## 2012-01-25 MED ORDER — ENOXAPARIN SODIUM 120 MG/0.8ML ~~LOC~~ SOLN
120.0000 mg | Freq: Every day | SUBCUTANEOUS | Status: DC
  Administered 2012-01-26 – 2012-01-27 (×2): 120 mg via SUBCUTANEOUS
  Filled 2012-01-25 (×2): qty 0.8

## 2012-01-25 MED ORDER — ZOLPIDEM TARTRATE 10 MG PO TABS
10.0000 mg | ORAL_TABLET | Freq: Every evening | ORAL | Status: DC | PRN
  Administered 2012-01-25: 10 mg via ORAL
  Filled 2012-01-25: qty 1

## 2012-01-25 MED ORDER — LACTATED RINGERS IV SOLN
INTRAVENOUS | Status: DC | PRN
  Administered 2012-01-25 (×2): via INTRAVENOUS

## 2012-01-25 MED ORDER — MIDAZOLAM HCL 5 MG/5ML IJ SOLN
INTRAMUSCULAR | Status: DC | PRN
  Administered 2012-01-25 (×2): 1 mg via INTRAVENOUS

## 2012-01-25 MED ORDER — FENTANYL CITRATE 0.05 MG/ML IJ SOLN
INTRAMUSCULAR | Status: DC | PRN
  Administered 2012-01-25: 150 ug via INTRAVENOUS
  Administered 2012-01-25: 100 ug via INTRAVENOUS
  Administered 2012-01-25: 50 ug via INTRAVENOUS
  Administered 2012-01-25: 150 ug via INTRAVENOUS
  Administered 2012-01-25: 50 ug via INTRAVENOUS

## 2012-01-25 MED ORDER — WARFARIN - PHYSICIAN DOSING INPATIENT: Freq: Every day | Status: DC

## 2012-01-25 MED ORDER — LACTATED RINGERS IR SOLN
Status: DC | PRN
  Administered 2012-01-25: 1000 mL

## 2012-01-25 MED ORDER — LACTATED RINGERS IV SOLN
INTRAVENOUS | Status: DC
  Administered 2012-01-25: 11:00:00 via INTRAVENOUS

## 2012-01-25 MED ORDER — CIPROFLOXACIN IN D5W 400 MG/200ML IV SOLN: 400.0000 mg | INTRAVENOUS | Status: DC

## 2012-01-25 MED ORDER — ONDANSETRON HCL 4 MG/2ML IJ SOLN: 4.0000 mg | Freq: Four times a day (QID) | INTRAMUSCULAR | Status: DC | PRN

## 2012-01-25 MED ORDER — WARFARIN SODIUM 5 MG PO TABS
5.0000 mg | ORAL_TABLET | Freq: Every day | ORAL | Status: DC
  Administered 2012-01-25 – 2012-01-26 (×2): 5 mg via ORAL
  Filled 2012-01-25 (×3): qty 1

## 2012-01-25 MED ORDER — MORPHINE SULFATE 2 MG/ML IJ SOLN
INTRAMUSCULAR | Status: AC
  Filled 2012-01-25: qty 1

## 2012-01-25 MED ORDER — BUPIVACAINE-EPINEPHRINE 0.25% -1:200000 IJ SOLN
INTRAMUSCULAR | Status: DC | PRN
  Administered 2012-01-25: 24 mL

## 2012-01-25 MED ORDER — OXYCODONE HCL 5 MG PO TABS
30.0000 mg | ORAL_TABLET | ORAL | Status: DC | PRN
  Administered 2012-01-25 – 2012-01-27 (×5): 30 mg via ORAL
  Filled 2012-01-25 (×6): qty 6

## 2012-01-25 MED ORDER — ROCURONIUM BROMIDE 100 MG/10ML IV SOLN
INTRAVENOUS | Status: DC | PRN
  Administered 2012-01-25: 40 mg via INTRAVENOUS

## 2012-01-25 MED ORDER — PHENAZOPYRIDINE HCL 200 MG PO TABS
ORAL_TABLET | ORAL | Status: AC
  Filled 2012-01-25: qty 1

## 2012-01-25 MED ORDER — PHENAZOPYRIDINE HCL 200 MG PO TABS
200.0000 mg | ORAL_TABLET | Freq: Every evening | ORAL | Status: DC
  Filled 2012-01-25: qty 1

## 2012-01-25 MED ORDER — AMLODIPINE BESYLATE 10 MG PO TABS
10.0000 mg | ORAL_TABLET | Freq: Every day | ORAL | Status: DC
Start: 2012-01-26 — End: 2012-01-27
  Administered 2012-01-26 – 2012-01-27 (×2): 10 mg via ORAL
  Filled 2012-01-25 (×2): qty 1

## 2012-01-25 MED ORDER — PHENAZOPYRIDINE HCL 200 MG PO TABS
200.0000 mg | ORAL_TABLET | Freq: Three times a day (TID) | ORAL | Status: AC | PRN
  Administered 2012-01-25: 200 mg via ORAL
  Filled 2012-01-25 (×3): qty 1

## 2012-01-25 MED ORDER — FENTANYL CITRATE 0.05 MG/ML IJ SOLN
INTRAMUSCULAR | Status: AC
  Filled 2012-01-25: qty 4

## 2012-01-25 MED ORDER — IOHEXOL 300 MG/ML  SOLN
INTRAMUSCULAR | Status: AC
  Filled 2012-01-25: qty 2

## 2012-01-25 MED ORDER — ONDANSETRON HCL 4 MG/2ML IJ SOLN
INTRAMUSCULAR | Status: DC | PRN
  Administered 2012-01-25: 4 mg via INTRAVENOUS

## 2012-01-25 MED ORDER — METOCLOPRAMIDE HCL 10 MG PO TABS: 10.0000 mg | ORAL_TABLET | Freq: Four times a day (QID) | ORAL | Status: DC | PRN

## 2012-01-25 MED ORDER — SILODOSIN 8 MG PO CAPS
8.0000 mg | ORAL_CAPSULE | Freq: Every day | ORAL | Status: DC | PRN
Start: 2012-01-25 — End: 2012-01-27
  Filled 2012-01-25: qty 1

## 2012-01-25 MED ORDER — PROMETHAZINE HCL 25 MG/ML IJ SOLN: 6.2500 mg | INTRAMUSCULAR | Status: DC | PRN

## 2012-01-25 MED ORDER — CEFAZOLIN SODIUM 1-5 GM-% IV SOLN
INTRAVENOUS | Status: AC
  Filled 2012-01-25: qty 100

## 2012-01-25 MED ORDER — CIPROFLOXACIN IN D5W 400 MG/200ML IV SOLN
INTRAVENOUS | Status: DC | PRN
  Administered 2012-01-25: 400 mg via INTRAVENOUS

## 2012-01-25 MED ORDER — ONDANSETRON HCL 4 MG PO TABS: 4.0000 mg | ORAL_TABLET | Freq: Four times a day (QID) | ORAL | Status: DC | PRN

## 2012-01-25 MED ORDER — MORPHINE SULFATE 10 MG/ML IJ SOLN
INTRAMUSCULAR | Status: AC
  Administered 2012-01-25: 4 mg via INTRAVENOUS
  Filled 2012-01-25: qty 1

## 2012-01-25 MED ORDER — FENTANYL CITRATE 0.05 MG/ML IJ SOLN
25.0000 ug | INTRAMUSCULAR | Status: DC | PRN
  Administered 2012-01-25 (×3): 50 ug via INTRAVENOUS

## 2012-01-25 SURGICAL SUPPLY — 40 items
ADH SKN CLS APL DERMABOND .7 (GAUZE/BANDAGES/DRESSINGS) ×2
APPLIER CLIP ROT 10 11.4 M/L (STAPLE) ×3
APR CLP MED LRG 11.4X10 (STAPLE) ×2
BAG SPEC RTRVL LRG 6X4 10 (ENDOMECHANICALS) ×2
BAG URO CATCHER STRL LF (DRAPE) ×1 IMPLANT
CANISTER SUCTION 2500CC (MISCELLANEOUS) ×3 IMPLANT
CATH REDDICK CHOLANGI 4FR 50CM (CATHETERS) ×3 IMPLANT
CLIP APPLIE ROT 10 11.4 M/L (STAPLE) ×2 IMPLANT
CLOTH BEACON ORANGE TIMEOUT ST (SAFETY) ×5 IMPLANT
COVER MAYO STAND STRL (DRAPES) ×3 IMPLANT
DECANTER SPIKE VIAL GLASS SM (MISCELLANEOUS) ×2 IMPLANT
DERMABOND ADVANCED (GAUZE/BANDAGES/DRESSINGS) ×1
DERMABOND ADVANCED .7 DNX12 (GAUZE/BANDAGES/DRESSINGS) ×2 IMPLANT
DRAPE C-ARM 42X72 X-RAY (DRAPES) ×3 IMPLANT
DRAPE LAPAROSCOPIC ABDOMINAL (DRAPES) ×3 IMPLANT
DRAPE UTILITY XL STRL (DRAPES) ×3 IMPLANT
ELECT REM PT RETURN 9FT ADLT (ELECTROSURGICAL) ×3
ELECTRODE REM PT RTRN 9FT ADLT (ELECTROSURGICAL) ×2 IMPLANT
GLOVE BIO SURGEON STRL SZ7.5 (GLOVE) ×6 IMPLANT
GLOVE BIOGEL PI IND STRL 7.0 (GLOVE) ×2 IMPLANT
GLOVE BIOGEL PI INDICATOR 7.0 (GLOVE) ×1
GOWN PREVENTION PLUS XLARGE (GOWN DISPOSABLE) ×3 IMPLANT
GOWN STRL NON-REIN LRG LVL3 (GOWN DISPOSABLE) ×3 IMPLANT
GOWN STRL REIN XL XLG (GOWN DISPOSABLE) ×3 IMPLANT
HEMOSTAT SURGICEL 4X8 (HEMOSTASIS) ×1 IMPLANT
IV CATH 14GX2 1/4 (CATHETERS) ×3 IMPLANT
KIT BASIN OR (CUSTOM PROCEDURE TRAY) ×3 IMPLANT
PACK CYSTO (CUSTOM PROCEDURE TRAY) ×1 IMPLANT
POUCH SPECIMEN RETRIEVAL 10MM (ENDOMECHANICALS) ×1 IMPLANT
SET IRRIG TUBING LAPAROSCOPIC (IRRIGATION / IRRIGATOR) ×3 IMPLANT
SOLUTION ANTI FOG 6CC (MISCELLANEOUS) ×3 IMPLANT
SUT MNCRL AB 4-0 PS2 18 (SUTURE) ×3 IMPLANT
TOWEL OR 17X26 10 PK STRL BLUE (TOWEL DISPOSABLE) ×9 IMPLANT
TRAY FOLEY CATH 14FRSI W/METER (CATHETERS) ×1 IMPLANT
TRAY LAP CHOLE (CUSTOM PROCEDURE TRAY) ×3 IMPLANT
TROCAR BLADELESS OPT 5 75 (ENDOMECHANICALS) ×6 IMPLANT
TROCAR XCEL BLUNT TIP 100MML (ENDOMECHANICALS) ×3 IMPLANT
TROCAR XCEL NON-BLD 11X100MML (ENDOMECHANICALS) ×3 IMPLANT
TUBING INSUFFLATION 10FT LAP (TUBING) ×3 IMPLANT
WATER STERILE IRR 3000ML UROMA (IV SOLUTION) ×2 IMPLANT

## 2012-01-25 NOTE — Brief Op Note (Signed)
01/25/2012  8:52 AM  PATIENT:  [name]  51 y.o. male  PRE-OPERATIVE DIAGNOSIS: Painful bladder.  POST-OPERATIVE DIAGNOSIS:  Same  PROCEDURE:  Procedure(s) (LRB):  CYSTOSCOPY/HYDRODISTENSION (N/A)  SURGEON:  Surgeon(s) and Role: Panel 2:    * Anner Crete, MD - Primary  PHYSICIAN ASSISTANT:   ASSISTANTS: none   ANESTHESIA:   general  EBL:     BLOOD ADMINISTERED:none  DRAINS: Urinary Catheter (Foley)   LOCAL MEDICATIONS USED:  NONE  SPECIMEN:  No Specimen  DISPOSITION OF SPECIMEN:  N/A  COUNTS:  YES  TOURNIQUET:  * No tourniquets in log *  DICTATION: .Other Dictation: Dictation Number number not noted.  PLAN OF CARE: Admit for overnight observation  PATIENT DISPOSITION:  PACU - hemodynamically stable.   Delay start of Pharmacological VTE agent (>24hrs) due to surgical blood loss or risk of bleeding: no

## 2012-01-25 NOTE — Telephone Encounter (Signed)
Records requested

## 2012-01-25 NOTE — Progress Notes (Signed)
Alex Hoffman is voiding but has a lot of burning and pain with voiding and had some bleeding but that has cleared.  His pain med isn't taking care of his pain well but he is getting morphine 4mg  q1hr IV.  He is on pyridium.  His cystoscopy showed some bladder outlet obstruction from the prostate.  He is currently on Rapaflo but still has some voiding issues.  I will leave a script for flomax 0.4mg  BID which is twice the usual dose to begin post discharge.  If his voiding symptoms don't improve, he might need a TURP or TUIP for further treatment.

## 2012-01-25 NOTE — Interval H&P Note (Signed)
History and Physical Interval Note:  01/25/2012 7:54 AM  [name]  has presented today for surgery, with the diagnosis of gallstones  The various methods of treatment have been discussed with the patient and family. After consideration of risks, benefits and other options for treatment, the patient has consented to  Procedure(s) (LRB): LAPAROSCOPIC CHOLECYSTECTOMY WITH INTRAOPERATIVE CHOLANGIOGRAM (N/A) CYSTOSCOPY/BIOPSY/HYDRODISTENSION (N/A) as a surgical intervention .  The patients' history has been reviewed, patient examined, no change in status, stable for surgery.  I have reviewed the patients' chart and labs.  Questions were answered to the patient's satisfaction.     Alex Hoffman

## 2012-01-25 NOTE — Op Note (Addendum)
01/25/2012  9:44 AM  PATIENT:  Alex Hoffman  51 y.o. male  PRE-OPERATIVE DIAGNOSIS:  gallstones and bladder pain  POST-OPERATIVE DIAGNOSIS:  gallstones and bladder pain  PROCEDURE:  Procedure(s) (LRB): LAPAROSCOPIC CHOLECYSTECTOMY WITH INTRAOPERATIVE CHOLANGIOGRAM (N/A) CYSTOSCOPY/BIOPSY/HYDRODISTENSION (N/A)  SURGEON:  Surgeon(s) and Role: Panel 1:    * Emelia Loron, MD - Assisting    * Robyne Askew, MD - Primary  Panel 2:    * Anner Crete, MD - Primary  PHYSICIAN ASSISTANT:   ASSISTANTS: Dr. Dwain Sarna   ANESTHESIA:   general  EBL:  Total I/O In: 1000 [I.V.:1000] Out: 500 [Urine:500]  BLOOD ADMINISTERED:none  DRAINS: none   LOCAL MEDICATIONS USED:  MARCAINE     SPECIMEN:  Source of Specimen:  gallbladder  DISPOSITION OF SPECIMEN:  PATHOLOGY  COUNTS:  YES  TOURNIQUET:  * No tourniquets in log *  DICTATION: .Dragon Dictation  Procedure: After informed consent was obtained the patient was brought to the operating room and placed in the supine position on the operating room table. After adequate induction of general anesthesia the patient's abdomen was prepped with ChloraPrep allowed to dry and draped in usual sterile manner. The area below the umbilicus was infiltrated with quarter percent  Marcaine. A small incision was made with a 15 blade knife. The incision was carried down through the subcutaneous tissue bluntly with a hemostat and [employer] retractors. The linea alba was identified. The linea alba was incised with a 15 blade knife and each side was grasped with Coker clamps. The preperitoneal space was then probed with a hemostat until the peritoneum was opened and access was gained to the abdominal cavity. A 0 Vicryl pursestring stitch was placed in the fascia surrounding the opening. A Hassan cannula was then placed through the opening and anchored in place with the previously placed Vicryl purse string stitch. The abdomen was insufflated with carbon  dioxide without difficulty. A laparoscope was inserted through the [facility] cannula in the right upper quadrant was inspected. Next the epigastric region was infiltrated with % Marcaine. A small incision was made with a 15 blade knife. A 10 mm port was placed bluntly through this incision into the abdominal cavity under direct vision. Next 2 sites were chosen laterally on the right side of the abdomen for placement of 5 mm ports. Each of these areas was infiltrated with quarter percent Marcaine. Small stab incisions were made with a 15 blade knife. 5 mm ports were then placed bluntly through these incisions into the abdominal cavity under direct vision without difficulty. A blunt grasper was placed through the lateralmost 5 mm port and used to grasp the dome of the gallbladder and elevated anteriorly and superiorly. Another blunt grasper was placed through the other 5 mm port and used to retract the body and neck of the gallbladder. A dissector was placed through the epigastric port and using the electrocautery the peritoneal reflection at the gallbladder neck was opened. Blunt dissection was then carried out in this area until the gallbladder neck-cystic duct junction was readily identified and a good window was created. A single clip was placed on the gallbladder neck. A small  ductotomy was made just below the clip with laparoscopic scissors. A 14-gauge Angiocath was then placed through the anterior abdominal wall under direct vision. A Reddick cholangiogram catheter was then placed through the Angiocath and flushed. The catheter was then placed in the cystic duct and anchored in place with a clip. A cholangiogram was obtained  that showed no filling defects good emptying into the duodenum an adequate length on the cystic duct. The pancreatic duct was also noted to be dilated and filled easily with contrast. No stones were seen. The anchoring clip and catheters were then removed from the patient. 3 clips were  placed proximally on the cystic duct and the duct was divided between the 2 sets of clips. Posterior to this the cystic artery was identified and again dissected bluntly in a circumferential manner until a good window  was created. 2 clips were placed proximally and one distally on the artery and the artery was divided between the 2 sets of clips. Next a laparoscopic hook cautery device was used to separate the gallbladder from the liver bed. Prior to completely detaching the gallbladder from the liver bed the liver bed was inspected and several small bleeding points were coagulated with the electrocautery until the area was completely hemostatic. The gallbladder was then detached the rest of it from the liver bed without difficulty. A laparoscopic bag was inserted through the epigastric port. The gallbladder was placed within the bag and the bag was sealed. A laparoscope was then moved to the epigastric port. The gallbladder grasper was placed through the [facility] cannula and used to grasp the opening of the bag. The bag with the gallbladder was then removed with the [facility] cannula through the infraumbilical port without difficulty. The fascial defect was then closed with the previously placed Vicryl pursestring stitch as well as with another figure-of-eight 0 Vicryl stitch. The liver bed was inspected again and found to be hemostatic. The abdomen was irrigated with copious amounts of saline until the effluent was clear. The ports were then removed under direct vision without difficulty and were found to be hemostatic. The gas was allowed to escape. The skin incisions were all closed with interrupted 4-0 Monocryl subcuticular stitches. Dermabond dressings were applied. The patient tolerated the procedure well. At the end of the case all needle sponge and instrument counts were correct. The patient was then awakened and taken to recovery in stable condition   PLAN OF CARE: Admit for overnight observation  PATIENT  DISPOSITION:  PACU - hemodynamically stable.   Delay start of Pharmacological VTE agent (>24hrs) due to surgical blood loss or risk of bleeding: no

## 2012-01-25 NOTE — Discharge Instructions (Signed)
Cystoscopy (Bladder Exam) A cystoscopy is an examination of your urinary bladder with a cystoscope. A cystoscope is an instrument like a small telescope with strong lights and lenses. It is inserted into the bladder through the urethra (the opening into the bladder) and allows your caregiver to examine the inside of your bladder. The procedure causes little discomfort and can be done in a hospital or office. It is a diagnostic procedure to evaluate the inside of your bladder. It may involve x-rays to further evaluate the ureters or internal aspects of the kidneys. It may aid in the removal of urinary stones  or in taking tissue samples (biopsies) if necessary. The procedure is easier in females because of a shorter urethra. In a male, the procedure must be done through the penis. This often requires more sedation and more time to do the procedure. The procedure usually takes twenty minutes to one half hour for a male and approximately an hour for a male. LET YOUR CAREGIVERS KNOW ABOUT:  Allergies.   Medications taken including herbs, eye drops, over the counter medications, and creams.   Use of steroids (by mouth or creams).   Previous problems with anesthetics or novocaine.   Possibility of pregnancy, if this applies.   History of blood clots (thrombophlebitis).   History of bleeding or blood problems.   Previous surgery, especially where prosthetics have been used like hip or knee replacements, and heart valve replacements.   Other health problems.  BEFORE THE PROCEDURE  You should be present 60 minutes prior to your procedure or as directed.  PROCEDURE During the procedure, you will:  Be assisted by your urologist and a nurse.   Lie on a cystoscopy table with your knees elevated and legs apart and covered with a drape. For women this is the same position as when a pap smear is taken.   Have the urethral area or penis washed and covered with sterile towels.   Have an anesthetic  (numbing) jelly applied to the urethra. This is usually all that is required for females but males may also require sedation.   Have the cystoscope inserted through the urethra and into the bladder. Sterile fluid will flow through the cystoscope and into the bladder. This will expand the bladder and provide clear fluid for the urologist to look through and examine the interior of the bladder.   Be allowed to go home once you are doing well, are stable, and awake if you were given a sedative. If given a sedative, have someone give you a ride home.  AFTER THE PROCEDURE  You may have temporary bleeding and burning on urination.   Drink lots of fluids.  SEEK IMMEDIATE MEDICAL CARE IF:  There is an increase in blood in the urine or if you are passing clots.   You have difficulty in passing your urine   You develop chills and/or an unexplained oral temperature above 102 F (38.9 C).  Your caregiver will discuss your results with you following the procedure. This may be at a later time if you have been sedated. If other testing or biopsies were taken, ask your caregiver how you are to obtain the results. Remember it is your responsibility to get your results. Do not assume everything is normal if you do not hear from your caregiver. Document Released: 10/14/2000 Document Revised: 10/06/2011 Document Reviewed: 08/07/2008 Exit[employer] Patient Information 2012 [employer]. 

## 2012-01-25 NOTE — H&P (Signed)
Alex Hoffman  Description:  51 year old male  12/15/2011 9:40 AM Office Visit Provider:  Robyne Askew, MD  MRN: [id number] Department:  [facility]            Diagnoses  Reason for Visit    Chronic abdominal pain - Primary  Pre-op Exam   789.00  eval recurrent pancreatitis           Vitals - Last Recorded       BP  Pulse  Temp(Src)  Ht  Wt  BMI    150/92  92  98.1 F (36.7 C) (Temporal)  6' (1.829 m)  180 lb 12.8 oz (82.01 kg)  24.52 kg/m2             Progress Notes     Robyne Askew, MD 12/16/2011 3:58 PM Signed    Subjective:    Patient ID: Alex Hoffman, male DOB: 1961/04/22, 51 y.o. MRN: [id number]  HPI  We are asked to see the pt in consultation by Dr. Cleone Slim to evaluate him for abd pain. The pt is a 51 yo wm who states that he has been sick for about the last year. He indicates that most of his pain is on his left flank and pelvic area although he does have some RUQ pain as well. He denies much nausea or vomiting with the pain and the pain has been pretty constant. He has had an ultrasound and CT that did not show gallstones. He apparently had an ERCP done at [facility] that showed some stones in his pancreatic duct but nothing in his CBD. He then had a repeat ERCP done is [facility] that showed some stones in both the CBD and pancreatic duct. No stents were ever placed as best I can tell. He has been hospitalized several times with pancreatitis. He has also been treated recently for C. Diff colitis but it is not clear if he was treated for an adequate period of time and he continues to have diarrhea. He apparently also has some chronic pain issues and is taking methadone at home. He also notes having difficulty with urination and is seeing a urologist in town.  Review of Systems  Constitutional: Positive for activity change.  HENT: Negative.  Eyes: Negative.  Respiratory: Negative.  Cardiovascular: Negative.  Gastrointestinal: Positive for  abdominal pain and diarrhea.  Genitourinary: Positive for dysuria and difficulty urinating.  Musculoskeletal: Negative.  Skin: Negative.  Neurological: Negative.  Hematological: Negative.  Psychiatric/Behavioral: Negative.      Objective:    Physical Exam  Constitutional: He is oriented to person, place, and time. He appears well-developed and well-nourished.  HENT:  Head: Normocephalic and atraumatic.  Eyes: Conjunctivae and EOM are normal. Pupils are equal, round, and reactive to light.  Neck: Normal range of motion. Neck supple.  Cardiovascular: Normal rate, regular rhythm and normal heart sounds.  Pulmonary/Chest: Effort normal and breath sounds normal.  Abdominal:  Moderate tenderness in all 4 quadrants. Worse in left flank and pubic areas. No peritonitis  Musculoskeletal: Normal range of motion.  Neurological: He is alert and oriented to person, place, and time.  Skin: Skin is warm and dry.  Psychiatric: He has a normal mood and affect. His behavior is normal.      Assessment:     Very complicated presentation. I believe he has several problems.  1) Urinary problem. This is being evaluated by a urologist  2) Possible C. Diff colitis. Needs to be checked for toxin again  3) Possible CBD and Pancreatic duct stones although CT and U/S did not show gallstones. With the pattern of his pain I can not guarantee that taking his gallbladder out will fix his pain or the stones in his pancreatic duct. My opinion is that he be evaluated by a Hepatobiliary Specialist. We will refer him to Dr. Marilynn Rail at [facility].  4) Factor V leiden deficiency. Continue coumadin     Plan:     Will refer to [facility]              Not recorded                            Patient Instructions     Will refer to Dr. Marilynn Rail at [facility]          Level of Service  Follow-up and Disposition    PR OFFICE CONSULTATION NEW/ESTAB PATIENT 60 MIN [99244]  Return if symptoms  worsen or fail to improve.           All Flowsheet Templates (all recorded)     Encounter Vitals Flowsheet   Custom Formula Data Flowsheet   Anthropometrics Flowsheet                           Referring Provider          Lynett Fish, MD            All Charges for This Encounter       Code  Description  Service Date  Service Provider  Modifiers  Quantity    [phone]  PR OFFICE CONSULTATION NEW/ESTAB PATIENT 60 MIN  12/15/2011  Robyne Askew, MD   1                Other Encounter Related Information     Allergies & Medications      Problem List      History      Patient-Entered Questionnaires       No data filed

## 2012-01-25 NOTE — Anesthesia Procedure Notes (Signed)
Procedure Name: Intubation Date/Time: 01/25/2012 8:25 AM Performed by: Kendal Hymen Pre-anesthesia Checklist: Patient being monitored, Suction available, Emergency Drugs available, Timeout performed and Patient identified Patient Re-evaluated:Patient Re-evaluated prior to inductionOxygen Delivery Method: Circle system utilized Preoxygenation: Pre-oxygenation with 100% oxygen Intubation Type: IV induction Ventilation: Mask ventilation without difficulty Laryngoscope Size: Miller and 3 Grade View: Grade I Tube type: Oral Number of attempts: 1 Airway Equipment and Method: Stylet Placement Confirmation: ETT inserted through vocal cords under direct vision,  positive ETCO2,  CO2 detector and breath sounds checked- equal and bilateral Secured at: 22 cm Tube secured with: Tape Dental Injury: Teeth and Oropharynx as per pre-operative assessment

## 2012-01-25 NOTE — OR Nursing (Signed)
Cystoscopy end time at 0839, Laparoscopic cholecystectomy start time at [phone]

## 2012-01-25 NOTE — Anesthesia Postprocedure Evaluation (Signed)
Anesthesia Post Note  Patient: Alex Hoffman  Procedure(s) Performed: Procedure(s) (LRB): LAPAROSCOPIC CHOLECYSTECTOMY WITH INTRAOPERATIVE CHOLANGIOGRAM (N/A) CYSTOSCOPY/BIOPSY/HYDRODISTENSION (N/A)  Anesthesia type: General  Patient location: PACU  Post pain: Pain level controlled  Post assessment: Post-op Vital signs reviewed  Last Vitals:  Filed Vitals:   01/25/12 1115  BP: 147/79  Pulse: 69  Temp: 36.4 C  Resp: 12    Post vital signs: Reviewed  Level of consciousness: sedated  Complications: No apparent anesthesia complications

## 2012-01-25 NOTE — Telephone Encounter (Signed)
Patient seen at [facility]. Per their note, he was referred to Dr. Marilynn Rail at [facility]. Pt having surgery today. Please see if we can get records from [facility].

## 2012-01-25 NOTE — Telephone Encounter (Signed)
Benedetto Goad, please see if you can get records. Thanks.

## 2012-01-25 NOTE — Anesthesia Preprocedure Evaluation (Signed)
Anesthesia Evaluation  Patient identified by MRN, date of birth, ID band Patient awake    Reviewed: Allergy & Precautions, H&P , NPO status , Patient's Chart, lab work & pertinent test results  History of Anesthesia Complications Negative for: history of anesthetic complications  Airway Mallampati: II TM Distance: >3 FB Neck ROM: Full    Dental  (+) Teeth Intact, Poor Dentition and Chipped,    Pulmonary sleep apnea (based on S-B score) , pneumonia , Current Smoker (< 1/2 ppd),  History of recurrent PE secondary blood dyscrasia. breath sounds clear to auscultation  Pulmonary exam normal       Cardiovascular hypertension, Pt. on medications Rhythm:Regular Rate:Normal     Neuro/Psych  Headaches, negative psych ROS   GI/Hepatic GERD-  Medicated,History recurrent pancreatitis   Endo/Other  negative endocrine ROS  Renal/GU negative Renal ROS  negative genitourinary   Musculoskeletal negative musculoskeletal ROS (+)   Abdominal   Peds negative pediatric ROS (+)  Hematology  (+) Blood dyscrasia (Leiden deficiency; on Coumadin), ,   Anesthesia Other Findings   Reproductive/Obstetrics negative OB ROS                           Anesthesia Physical Anesthesia Plan  ASA: III  Anesthesia Plan: General   Post-op Pain Management:    Induction: Intravenous  Airway Management Planned: Oral ETT  Additional Equipment:   Intra-op Plan:   Post-operative Plan: Extubation in OR  Informed Consent: I have reviewed the patients History and Physical, chart, labs and discussed the procedure including the risks, benefits and alternatives for the proposed anesthesia with the patient or authorized representative who has indicated his/her understanding and acceptance.   Dental advisory given  Plan Discussed with: CRNA  Anesthesia Plan Comments:         Anesthesia Quick Evaluation

## 2012-01-25 NOTE — Interval H&P Note (Signed)
History and Physical Interval Note:  01/25/2012 8:11 AM  Alex Hoffman  has presented today for surgery, with the diagnosis of gallstones  The various methods of treatment have been discussed with the patient and family. After consideration of risks, benefits and other options for treatment, the patient has consented to  Procedure(s) (LRB): LAPAROSCOPIC CHOLECYSTECTOMY WITH INTRAOPERATIVE CHOLANGIOGRAM (N/A) CYSTOSCOPY/BIOPSY/HYDRODISTENSION (N/A) as a surgical intervention .  The patients' history has been reviewed, patient examined, no change in status, stable for surgery.  I have reviewed the patients' chart and labs.  Questions were answered to the patient's satisfaction.     TOTH III,George Alcantar S

## 2012-01-25 NOTE — Transfer of Care (Signed)
Immediate Anesthesia Transfer of Care Note  Patient: Alex Hoffman  Procedure(s) Performed: Procedure(s) (LRB): LAPAROSCOPIC CHOLECYSTECTOMY WITH INTRAOPERATIVE CHOLANGIOGRAM (N/A) CYSTOSCOPY/BIOPSY/HYDRODISTENSION (N/A)  Patient Location: PACU  Anesthesia Type: General  Level of Consciousness: sedated  Airway & Oxygen Therapy: Patient Spontanous Breathing  Post-op Assessment: Report given to PACU RN and Post -op Vital signs reviewed and stable  Post vital signs: Reviewed  Complications: No apparent anesthesia complications

## 2012-01-26 LAB — PROTIME-INR: INR: 1.04 (ref 0.00–1.49)

## 2012-01-26 MED ORDER — MORPHINE SULFATE 2 MG/ML IJ SOLN
INTRAMUSCULAR | Status: AC
  Administered 2012-01-26: 2 mg via INTRAVENOUS
  Filled 2012-01-26: qty 1

## 2012-01-26 NOTE — Progress Notes (Signed)
1 Day Post-Op  Subjective: Complains of pain. Hasn't taken much po  Objective: Vital signs in last 24 hours: Temp:  [96.7 F (35.9 C)-100 F (37.8 C)] 98.3 F (36.8 C) (03/28 0814) Pulse Rate:  [66-80] 80  (03/28 0814) Resp:  [11-24] 22  (03/28 0814) BP: (141-178)/(71-95) 152/93 mmHg (03/28 0814) SpO2:  [94 %-100 %] 99 % (03/28 0814) Weight:  [187 lb (84.823 kg)] 187 lb (84.823 kg) (03/27 1604) Last BM Date: 01/25/12  Intake/Output from previous day: 03/27 0701 - 03/28 0700 In: 4290 [P.O.:840; I.V.:3450] Out: 3800 [Urine:3800] Intake/Output this shift:    GI: soft, tender. incisions look good  Lab Results:  No results found for this basename: WBC:2,HGB:2,HCT:2,PLT:2 in the last 72 hours BMET No results found for this basename: NA:2,K:2,CL:2,CO2:2,GLUCOSE:2,BUN:2,CREATININE:2,CALCIUM:2 in the last 72 hours PT/INR  Basename 01/26/12 0439 01/25/12 0700  LABPROT 13.8 13.9  INR 1.04 1.05   ABG No results found for this basename: PHART:2,PCO2:2,PO2:2,HCO3:2 in the last 72 hours  Studies/Results: Dg Cholangiogram Operative  01/25/2012  *RADIOLOGY REPORT*  Clinical Data:   Gallstones.  INTRAOPERATIVE CHOLANGIOGRAM  Technique:  Cholangiographic images from the C-arm fluoroscopic device were submitted for interpretation post-operatively.  Please see the procedural report for the amount of contrast and the fluoroscopy time utilized.  Comparison:  CT 10/19/2013l  Findings:  Multiple spot fluoroscopic images are provided.  Initial image demonstrates cannulation of the cystic duct.  Injection of contrast demonstrates filling of the common bile duct and common hepatic duct without evidence of stones within the duct.   Contrast flows freely into the duodenum.  The distal pancreatic duct is ectatic and mildly dilated with several small side branches.  This is mildly increased from comparison CT of 08/19/2011.  IMPRESSION:  1.  No filling defect within the common bile duct or evidence of  obstruction. 1.  Filling of a dilated pancreatic duct with side branch ectasia which appears slightly more prominent than comparison CT.  Original Report Authenticated By: Genevive Bi, M.D.    Anti-infectives: Anti-infectives     Start     Dose/Rate Route Frequency Ordered Stop   01/25/12 0639   ciprofloxacin (CIPRO) IVPB 400 mg  Status:  Discontinued        400 mg 200 mL/hr over 60 Minutes Intravenous 60 min pre-op 01/25/12 9604 01/25/12 1224   01/25/12 0639   ceFAZolin (ANCEF) IVPB 2 g/50 mL premix        2 g 100 mL/hr over 30 Minutes Intravenous 60 min pre-op 01/25/12 5409 01/25/12 0820          Assessment/Plan: s/p Procedure(s) (LRB): LAPAROSCOPIC CHOLECYSTECTOMY WITH INTRAOPERATIVE CHOLANGIOGRAM (N/A) CYSTOSCOPY/BIOPSY/HYDRODISTENSION (N/A) Advance diet Continue on home pain meds Hopefully ready for d/c tomorrow  LOS: 1 day    TOTH III,Nilay Mangrum S 01/26/2012

## 2012-01-26 NOTE — Op Note (Signed)
NAMEKENDALE, REMBOLD                 ACCOUNT NO.:  [id number]  MEDICAL RECORD NO.:  [id number]  LOCATION:  1531                         FACILITY:  [facility]  PHYSICIAN:  Excell Seltzer. Annabell Howells, M.D.    DATE OF BIRTH:  03-Nov-1960  DATE OF PROCEDURE: DATE OF DISCHARGE:                              OPERATIVE REPORT   PROCEDURE:  Cystoscopy with hydrodistention of bladder.  PREOPERATIVE DIAGNOSIS:  Painful bladder.  POSTOPERATIVE DIAGNOSIS:  Painful bladder.  SURGEON:  Excell Seltzer. Annabell Howells, M.D.  ANESTHESIA:  General.  SPECIMEN:  None.  DRAINS:  None.  COMPLICATIONS:  None.  INDICATIONS:  Mr. Minks is a 51 year old white male with a painful bladder.  He does have a reduce stream, some hesitancy and intermittency straining to void.  He was scheduled to undergo cysto with hydrodistention of bladder with possible biopsy to further assess his symptoms.  FINDINGS AT PROCEDURE:  He was given Cipro.  He was taken to the operating room where general anesthetic was induced.  He was placed in lithotomy position.  His perineum and genitalia were prepped with Betadine solution.  He was draped in the usual sterile fashion. Cystoscopy was performed using the 22-French scope and 12 and 70 degree lenses.  Examination revealed a normal urethra.  The external sphincter was intact.  The prostatic urethra was short with trilobar hyperplasia with mild obstruction.  Examination of the bladder revealed mild trabeculation.  No tumors, stones, or inflammation were noted.  Ureteral orifices were in the normal anatomic position effluxing clear urine.  After initial cystoscopy, the bladder was hydrodistended to capacity under 80 cm of water pressure, and then drained.  His capacity under anesthesia was 1000 mL.  He had no bleeding with terminal efflux.  Recystoscopy after hydrodistention demonstrated no glomerulations or other findings suggestive of interstitial cystitis.  At this point, a 14-French Foley catheter was  inserted and will be removed in the recovery room.  IMPRESSION:  Painful bladder with obstruction, irritative voiding symptoms, but no evidence of interstitial cystitis.  Foley catheter was placed, the bladder was drained.  Dr. Carolynne Edouard then came in and performed a cholecystectomy.  There were no complications during my portion of the procedure.     Excell Seltzer. Annabell Howells, M.D.     JJW/MEDQ  D:  01/25/2012  T:  01/26/2012  Job:  [id number]  cc:   Ollen Gross. Vernell Morgans, M.D. [address], [address] [place] [address]  Dr. Emmaline Kluver

## 2012-01-27 ENCOUNTER — Encounter (HOSPITAL_COMMUNITY): Payer: Self-pay | Admitting: General Surgery

## 2012-01-27 LAB — PROTIME-INR: Prothrombin Time: 15.2 seconds (ref 11.6–15.2)

## 2012-01-27 MED ORDER — WARFARIN - PHARMACIST DOSING INPATIENT: Freq: Every day | Status: DC

## 2012-01-27 MED ORDER — ENOXAPARIN SODIUM 120 MG/0.8ML ~~LOC~~ SOLN: 120.0000 mg | Freq: Every day | SUBCUTANEOUS | Status: DC

## 2012-01-27 MED ORDER — WARFARIN SODIUM 5 MG PO TABS
5.0000 mg | ORAL_TABLET | Freq: Once | ORAL | Status: DC
  Filled 2012-01-27: qty 1

## 2012-01-27 MED ORDER — ACETAMINOPHEN 10 MG/ML IV SOLN
1000.0000 mg | Freq: Four times a day (QID) | INTRAVENOUS | Status: DC
  Administered 2012-01-27: 1000 mg via INTRAVENOUS
  Filled 2012-01-27 (×2): qty 100

## 2012-01-27 NOTE — Progress Notes (Signed)
ANTICOAGULATION CONSULT NOTE - Initial Consult  Pharmacy Consult for Warfarin Indication: Factor V Leiden Mutation; Hx of PE  No Known Allergies  Patient Measurements: Height: 6' (182.9 cm) Weight: 187 lb (84.823 kg) IBW/kg (Calculated) : 77.6    Vital Signs: Temp: 97.7 F (36.5 C) (03/29 0610) Temp src: Oral (03/29 0610) BP: 136/83 mmHg (03/29 0610) Pulse Rate: 67  (03/29 0610)  Labs:  Alvira Philips 01/27/12 0439 01/26/12 0439 01/25/12 0700  HGB -- -- --  HCT -- -- --  PLT -- -- --  APTT -- -- --  LABPROT 15.2 13.8 13.9  INR 1.18 1.04 1.05  HEPARINUNFRC -- -- --  CREATININE -- -- --  CKTOTAL -- -- --  CKMB -- -- --  TROPONINI -- -- --   Estimated Creatinine Clearance: 118.6 ml/min (by C-G formula based on Cr of 0.78).  Medical History: Past Medical History  Diagnosis Date  . Pulmonary embolism 12/2010  . Cystic thyroid nodule     left  . HTN (hypertension)   . Heterozygous factor V Leiden mutation 12/2010  . Pancreatitis 05/2011    EUS Dr Christella Hartigan 06/09/11-> dilated main pancreatic duct in HOP, 2-3 filling defects distal pancreatic duct, peripancreatic fluid resolved  . C. difficile colitis 01/2011?  Marland Kitchen Arthritis   . Clotting disorder   . GERD (gastroesophageal reflux disease)   . Chills   . Fever   . Weight loss   . Hearing loss   . Nasal congestion   . Leg swelling   . Palpitations   . Abdominal distension   . Abdominal pain   . Nausea & vomiting   . Rectal pain   . Difficulty urinating   . Arthritis pain   . Headache   . Weakness   . Bruises easily   . Anemia   . Dizziness   . Numbness and tingling     both hands, and both legs and feet  . Joint pain   . Blood in urine     hx of, none current  . Frequent urination at night   . Pneumonia as child  . Abnormal EKG     hx left bundle branch block on ekg  . Sleep apnea     STOPBANG=5   Lovenox 120mg  (1.5mg /kg) SQ q24h started 3/28 Warfarin doses administered this admission: 5mg  on 3/28,  3/29.   Assessment:  51 year-old M on chronic warfarin for Factor V Leiden mutation (heterozygous) and history of PE, now POD#2 following laparoscopic cholecystectomy and cystoscopy with hydrodistention of bladder.  Full-dose Lovenox bridging (once-daily regimen) was started POD#1.  Home warfarin dosage is reportedly 5mg  daily.  Patient states he had been stable on this dosage for several months until anticoagulation was interrupted for surgery.  INR beginning to respond after warfarin 5mg  daily x 2.   Goal of Therapy:  INR 2-3   Plan:   Warfarin 5mg  PO today.  Agree with current Lovenox bridge dosing.  Recheck INR tomorrow.   Elie Goody, PharmD, BCPS Pager: [phone] 01/27/2012  9:51 AM

## 2012-01-27 NOTE — Progress Notes (Signed)
Pt requesting to go home.  He states that Dr. Daphine Deutscher spoke with him this am about possibly going home today if he felt better.  Pt ambulated around unit x1 independently with no problems.  He states he feels ready to go home.  On call MD notified and states he will d/c.

## 2012-01-27 NOTE — Progress Notes (Addendum)
Patient ID: Alex Hoffman, male   DOB: 1961-02-01, 51 y.o.   MRN: [id number] [facility] Surgery Progress Note:   2 Days Post-Op  Subjective: Mental status is clear.  Having pain issues and is not getting out of bed.  Objective: Vital signs in last 24 hours: Temp:  [97.6 F (36.4 C)-98.5 F (36.9 C)] 97.7 F (36.5 C) (03/29 0610) Pulse Rate:  [63-79] 67  (03/29 0610) Resp:  [18-22] 18  (03/29 0610) BP: (134-145)/(78-86) 136/83 mmHg (03/29 0610) SpO2:  [94 %-99 %] 96 % (03/29 0610)  Intake/Output from previous day: 03/28 0701 - 03/29 0700 In: 2165 [P.O.:600; I.V.:1565] Out: 2600 [Urine:2600] Intake/Output this shift: Total I/O In: 240 [P.O.:240] Out: -   Physical Exam: Work of breathing is  Normal.  Incisons look ok.  Complaining of lower abdominal pain related to bladder and urination.    Lab Results:  Results for orders placed during the hospital encounter of 01/25/12 (from the past 48 hour(s))  PROTIME-INR     Status: Normal   Collection Time   01/26/12  4:39 AM      Component Value Range Comment   Prothrombin Time 13.8  11.6 - 15.2 (seconds)    INR 1.04  0.00 - 1.49    PROTIME-INR     Status: Normal   Collection Time   01/27/12  4:39 AM      Component Value Range Comment   Prothrombin Time 15.2  11.6 - 15.2 (seconds)    INR 1.18  0.00 - 1.49      Radiology/Results: Dg Cholangiogram Operative  01/25/2012  *RADIOLOGY REPORT*  Clinical Data:   Gallstones.  INTRAOPERATIVE CHOLANGIOGRAM  Technique:  Cholangiographic images from the C-arm fluoroscopic device were submitted for interpretation post-operatively.  Please see the procedural report for the amount of contrast and the fluoroscopy time utilized.  Comparison:  CT 10/19/2013l  Findings:  Multiple spot fluoroscopic images are provided.  Initial image demonstrates cannulation of the cystic duct.  Injection of contrast demonstrates filling of the common bile duct and common hepatic duct without evidence of stones within  the duct.   Contrast flows freely into the duodenum.  The distal pancreatic duct is ectatic and mildly dilated with several small side branches.  This is mildly increased from comparison CT of 08/19/2011.  IMPRESSION:  1.  No filling defect within the common bile duct or evidence of obstruction. 1.  Filling of a dilated pancreatic duct with side branch ectasia which appears slightly more prominent than comparison CT.  Original Report Authenticated By: Genevive Bi, M.D.    Anti-infectives: Anti-infectives     Start     Dose/Rate Route Frequency Ordered Stop   01/25/12 0639   ciprofloxacin (CIPRO) IVPB 400 mg  Status:  Discontinued        400 mg 200 mL/hr over 60 Minutes Intravenous 60 min pre-op 01/25/12 0639 01/25/12 1224   01/25/12 0639   ceFAZolin (ANCEF) IVPB 2 g/50 mL premix        2 g 100 mL/hr over 30 Minutes Intravenous 60 min pre-op 01/25/12 4098 01/25/12 0820          Assessment/Plan: Problem List: Patient Active Problem List  Diagnoses  . GERD (gastroesophageal reflux disease)  . Chronic abdominal pain  . Dysphagia, unspecified  . Rectal bleeding  . Constipation  . Pancreatitis  . Abnormal LFTs  . Hematuria, gross  . Diarrhea  . Hypokalemia    Will try IV Ofirmiv to see if it  will break pain cycle.   2 Days Post-Op    LOS: 2 days   Matt B. Daphine Deutscher, MD, [facility] [phone] beeper [phone]  01/27/2012 9:22 AM The patient has excellent pain control and wants to go home.  Can restart coumadin Saturday.  Follow up with Dr Carolynne Edouard in 2 - 3 weeks.

## 2012-02-24 ENCOUNTER — Encounter (INDEPENDENT_AMBULATORY_CARE_PROVIDER_SITE_OTHER): Payer: Self-pay | Admitting: General Surgery

## 2012-03-14 ENCOUNTER — Encounter (INDEPENDENT_AMBULATORY_CARE_PROVIDER_SITE_OTHER): Payer: Self-pay | Admitting: General Surgery

## 2012-03-14 ENCOUNTER — Ambulatory Visit (INDEPENDENT_AMBULATORY_CARE_PROVIDER_SITE_OTHER): Payer: Self-pay | Admitting: General Surgery

## 2012-03-14 VITALS — BP 138/86 | HR 78 | Temp 97.9°F | Resp 16 | Ht 72.0 in | Wt 187.6 lb

## 2012-03-14 DIAGNOSIS — K819 Cholecystitis, unspecified: Secondary | ICD-10-CM | POA: Insufficient documentation

## 2012-03-14 DIAGNOSIS — R109 Unspecified abdominal pain: Secondary | ICD-10-CM

## 2012-03-14 DIAGNOSIS — G8929 Other chronic pain: Secondary | ICD-10-CM

## 2012-03-14 NOTE — Patient Instructions (Signed)
Nothing to eat or drink four hours before the CT scan.  Drink the first bottle of contrast two hours before the test and the second bottle of contrast one hour before the scan.

## 2012-03-14 NOTE — Progress Notes (Signed)
Subjective:     Patient ID: Alex Hoffman, male   DOB: [birth year], 51 y.o.   MRN: [id number]  HPI The patient is a 51 year old white male who is about 6 weeks out from a laparoscopic cholecystectomy for chronic cholecystitis. No gallstones were seen. He has continued to have the same abdominal pain that he was having prior to surgery. His appetite is poor. His bowels are moving fairly regularly. He denies any diarrhea. The majority of his pain seems to be urologically related.  Review of Systems     Objective:   Physical Exam On exam his abdomen is moderately diffusely tender. This is unchanged from his preoperative exam. His incisions are all healing nicely with no evidence of infection    Assessment:     Status post laparoscopic cholecystectomy    Plan:     At this point I will plan to obtain a CT scan of his abdomen pelvis to make sure there is no complication related to his laparoscopic cholecystectomy. If there is not then I do not have a good explanation for his abdominal pain. He and I had a long conversation prior surgeries that I felt that removing his gallbladder would not affect his pain level. If the CT is negative we will have him followup on a p.r.n. basis.

## 2012-03-20 ENCOUNTER — Ambulatory Visit
Admission: RE | Admit: 2012-03-20 | Discharge: 2012-03-20 | Disposition: A | Discharge status: Home / Self Care | Payer: No Typology Code available for payment source | Source: Ambulatory Visit | Attending: General Surgery | Admitting: General Surgery

## 2012-03-20 DIAGNOSIS — R109 Unspecified abdominal pain: Secondary | ICD-10-CM

## 2012-03-20 MED ORDER — IOHEXOL 300 MG/ML  SOLN
100.0000 mL | Freq: Once | INTRAMUSCULAR | Status: AC | PRN
  Administered 2012-03-20: 100 mL via INTRAVENOUS

## 2012-03-23 ENCOUNTER — Other Ambulatory Visit (INDEPENDENT_AMBULATORY_CARE_PROVIDER_SITE_OTHER): Payer: Self-pay | Admitting: General Surgery

## 2012-03-23 ENCOUNTER — Telehealth (INDEPENDENT_AMBULATORY_CARE_PROVIDER_SITE_OTHER): Payer: Self-pay | Admitting: General Surgery

## 2012-03-23 DIAGNOSIS — R945 Abnormal results of liver function studies: Secondary | ICD-10-CM

## 2012-03-23 NOTE — Telephone Encounter (Signed)
Pt will get a call to get lab work done and will he will get a call from our office about the results to see if he needs to go back to GI Dr

## 2012-03-23 NOTE — Telephone Encounter (Signed)
Spoke with pt and informed him that Dr. Carolynne Edouard wants him to have a CMET done.  I let him know that the orders where faxed over to [facility] and that he needs to go at his earliest convenience.  Pt said he probably wouldn't be able to go until Tuesday but that he would go as soon as he can get there.

## 2012-03-23 NOTE — Telephone Encounter (Signed)
Message copied by Littie Deeds on Fri Mar 23, 2012  9:30 AM ------      Message from: Caleen Essex III      Created: Fri Mar 23, 2012  8:59 AM       Lets recheck his cmet. If his liver functions are elevated then he goes back to the GI docs

## 2012-03-29 ENCOUNTER — Other Ambulatory Visit (INDEPENDENT_AMBULATORY_CARE_PROVIDER_SITE_OTHER): Payer: Self-pay | Admitting: General Surgery

## 2012-03-29 LAB — COMPREHENSIVE METABOLIC PANEL
ALT: 14 U/L (ref 0–53)
CO2: 28 mEq/L (ref 19–32)
Calcium: 9.4 mg/dL (ref 8.4–10.5)
Chloride: 102 mEq/L (ref 96–112)
Sodium: 138 mEq/L (ref 135–145)
Total Bilirubin: 0.7 mg/dL (ref 0.3–1.2)
Total Protein: 6.8 g/dL (ref 6.0–8.3)

## 2012-04-05 ENCOUNTER — Telehealth (INDEPENDENT_AMBULATORY_CARE_PROVIDER_SITE_OTHER): Payer: Self-pay | Admitting: General Surgery

## 2012-04-05 ENCOUNTER — Encounter: Payer: Self-pay | Admitting: Hematology and Oncology

## 2012-04-05 NOTE — Telephone Encounter (Signed)
PT CALLED YESTERDAY RE LAB RESULTS AND F/U ADVICE/ I REVIEWED LAB RESULTS WITH DR. TOTH AND HE SAID LIVER ENZYMES WERE NORMAL AND THAT MR Costen SHOULD FOLLOW-UP WITH HIS MEDICAL DR. REGARDING SYMPTOMS/PT ADVISED/GY

## 2012-04-25 ENCOUNTER — Encounter: Payer: Self-pay | Admitting: Hematology and Oncology

## 2012-04-25 DIAGNOSIS — D6859 Other primary thrombophilia: Secondary | ICD-10-CM

## 2012-04-25 DIAGNOSIS — I2699 Other pulmonary embolism without acute cor pulmonale: Secondary | ICD-10-CM

## 2012-06-05 ENCOUNTER — Ambulatory Visit: Payer: Self-pay | Admitting: Internal Medicine

## 2012-06-06 ENCOUNTER — Encounter: Payer: Self-pay | Admitting: Hematology and Oncology

## 2012-06-06 DIAGNOSIS — D6859 Other primary thrombophilia: Secondary | ICD-10-CM

## 2012-06-06 DIAGNOSIS — I2699 Other pulmonary embolism without acute cor pulmonale: Secondary | ICD-10-CM

## 2012-06-06 DIAGNOSIS — K861 Other chronic pancreatitis: Secondary | ICD-10-CM

## 2012-06-06 DIAGNOSIS — Z7901 Long term (current) use of anticoagulants: Secondary | ICD-10-CM

## 2012-06-08 ENCOUNTER — Ambulatory Visit: Payer: Self-pay | Admitting: Internal Medicine

## 2012-06-19 ENCOUNTER — Ambulatory Visit (INDEPENDENT_AMBULATORY_CARE_PROVIDER_SITE_OTHER): Payer: Self-pay | Admitting: Internal Medicine

## 2012-06-19 ENCOUNTER — Encounter: Payer: Self-pay | Admitting: Internal Medicine

## 2012-06-19 VITALS — BP 114/75 | HR 75 | Temp 98.0°F | Ht 72.0 in | Wt 197.6 lb

## 2012-06-19 DIAGNOSIS — K861 Other chronic pancreatitis: Secondary | ICD-10-CM

## 2012-06-19 DIAGNOSIS — K859 Acute pancreatitis without necrosis or infection, unspecified: Secondary | ICD-10-CM

## 2012-06-19 NOTE — Progress Notes (Signed)
Primary Care Physician:  [name], MD Primary Gastroenterologist:  Dr. Jena Gauss  Pre-Procedure History & Physical: HPI:  Alex Hoffman is a 51 y.o. male here for followup of chronic pancreatitis. Status post cholecystectomy earlier this year by Dr. Carolynne Edouard.  Prior EUS/you suggested not only pancreatic duct stones the common bile duct stone. MRCP did not corroborate common duct stones. Saw Dr. Marilynn Rail at [facility] several months ago who did not feel he was a candidate for pancreaticojejunostomy. Patient continues on acid suppression and pancreatic enzyme supplements.  Is notable patient's gained 19 pounds since January of this year. He states he's had only one brief episode of epigastric pain reminiscent of his pancreatitis pain previously since getting his gallbladder out. Those symptoms lasted only about 2-1/2 hours. His big problem these days is suprapubic waxing and waning pain associated with trying to urinate. He states trying to urinate is an ordeal. He never feels his urinary bladder empties. He did have a bladder procedure by Dr. Annabell Howells earlier in the year. Apparently further surgery has been recommended. Patient clearly differentiates his urinary tract symptoms from his pain related to pancreatitis previously. Overall, patient states his pancreatitis has been much better since gallbladder surgery. Moreover, he states his bowel function is improved he has one formed bowel movement daily. He continues on Reglan.   Past Medical History  Diagnosis Date  . Pulmonary embolism 12/2010  . Cystic thyroid nodule     left  . HTN (hypertension)   . Heterozygous factor V Leiden mutation 12/2010  . Pancreatitis 05/2011    EUS Dr Christella Hartigan 06/09/11-> dilated main pancreatic duct in HOP, 2-3 filling defects distal pancreatic duct, peripancreatic fluid resolved  . C. difficile colitis 01/2011?  Marland Kitchen Arthritis   . Clotting disorder   . GERD (gastroesophageal reflux disease)   . Chills   . Fever   . Weight loss   .  Hearing loss   . Nasal congestion   . Leg swelling   . Palpitations   . Abdominal distension   . Abdominal pain   . Nausea & vomiting   . Rectal pain   . Difficulty urinating   . Arthritis pain   . Headache   . Weakness   . Bruises easily   . Anemia   . Dizziness   . Numbness and tingling     both hands, and both legs and feet  . Joint pain   . Blood in urine     hx of, none current  . Frequent urination at night   . Pneumonia as child  . Abnormal EKG     hx left bundle branch block on ekg  . Sleep apnea     STOPBANG=5  . Heterozygous factor V Leiden mutation   . Prostatitis, chronic   . Pancreatitis     Past Surgical History  Procedure Date  . Neck surgery 1999    C5/C6 fusion, limited neck mobility especially turning to left  . Knee surgery 1980's    arthroscopy, left knee  . Femur fracture surgery age 66    right leg  . Nerve surgery 1980's    right thumb  . Colonoscopy 01/2011    Dr. Zenon Mayo at [facility]-->normal  . Esophagogastroduodenoscopy 01/2011    Dr. Zenon Mayo at [facility]-->bile reflux in stomach-->patient c/o inadequate conscious sedation (Fentanyl 100mg /Versed 8mg )  . Ercp 07/2011    Dr. Logan Bores [facility]-> pancreatic enterotomy, pancreatic duct stones removed, bile duct not manipulated, no stents  . Cholecystectomy 01/25/2012  Procedure: LAPAROSCOPIC CHOLECYSTECTOMY WITH INTRAOPERATIVE CHOLANGIOGRAM;  Surgeon: Robyne Askew, MD;  Location: [facility];  Service: General;  Laterality: N/A;  . Cystoscopy with hydrodistension and biopsy 01/25/2012    Procedure: CYSTOSCOPY/BIOPSY/HYDRODISTENSION;  Surgeon: Anner Crete, MD;  Location: [facility];  Service: Urology;  Laterality: N/A;  Cystoscopy and Hydrodistension of the Bladder    Prior to Admission medications   Medication Sig Start Date End Date Taking? Authorizing Provider  amLODipine (NORVASC) 10 MG tablet Take 10 mg by mouth every morning.    Yes Historical Provider, MD  diazepam (VALIUM) 5 MG tablet Take 5 mg  by mouth every 6 (six) hours as needed. Takes two tablets at bedtime for sleep and anxiety   Yes Historical Provider, MD  esomeprazole (NEXIUM) 40 MG capsule Take 40 mg by mouth 2 (two) times daily as needed. For indigestion   Yes Historical Provider, MD  Methadone HCl (DOLOPHINE PO) Take 70 mg by mouth.   Yes Historical Provider, MD  metoCLOPramide (REGLAN) 10 MG tablet Take 10 mg by mouth 4 (four) times daily as needed. For nausea   Yes Historical Provider, MD  oxycodone (ROXICODONE) 30 MG immediate release tablet Take 30 mg by mouth every 4 (four) hours as needed. For pain   Yes Historical Provider, MD  phenazopyridine (PYRIDIUM) 100 MG tablet Take 200 mg by mouth every evening.    Yes Historical Provider, MD  prochlorperazine (COMPAZINE) 10 MG tablet Take 5 mg by mouth 3 (three) times daily.    Yes Historical Provider, MD  silodosin (RAPAFLO) 8 MG CAPS capsule Take 8 mg by mouth as needed.    Yes Historical Provider, MD  tiZANidine (ZANAFLEX) 4 MG tablet Take 4 mg by mouth every 6 (six) hours as needed.   Yes Historical Provider, MD  warfarin (COUMADIN) 5 MG tablet Take 5 mg by mouth daily.   Yes Historical Provider, MD  zolpidem (AMBIEN) 10 MG tablet Take 10 mg by mouth at bedtime as needed. For sleep   Yes Historical Provider, MD    Allergies as of 06/19/2012  . (No Known Allergies)    Family History  Problem Relation Age of Onset  . GI problems Brother   . Colon cancer Neg Hx   . Liver disease Neg Hx   . GI problems      stomach and lung cancer, aunts/uncles  . Heart attack Brother     before age of 66  . Stroke Brother   . Hypertension Mother   . Hypertension Father   . Cancer Maternal Aunt     colon/pancreas/lung/stomach  . Heart disease Maternal Uncle   . Cancer Maternal Grandfather     lung    History   Social History  . Marital Status: Divorced    Spouse Name: N/A    Number of Children: 2  . Years of Education: N/A   Occupational History  .      advertising  and marketing [employer]  .      most recently helping with rental units   Social History Main Topics  . Smoking status: Current Everyday Smoker -- 0.5 packs/day for 35 years    Types: Cigarettes    Last Attempt to Quit: 01/27/2011  . Smokeless tobacco: Never Used   Comment: smoking one pack daily/ took self off of patch  . Alcohol Use: No     None since hospitalized with pancreatitis in 05/2011. Prior to that, 1-2 wine coolers few days a week. Denies  heavy or daily consumption.  . Drug Use: No  . Sexually Active: Not on file   Other Topics Concern  . Not on file   Social History Narrative  . No narrative on file    Review of Systems: See HPI, otherwise negative ROS  Physical Exam: BP 114/75  Pulse 75  Temp 98 F (36.7 C) (Temporal)  Ht 6' (1.829 m)  Wt 197 lb 9.6 oz (89.631 kg)  BMI 26.80 kg/m2 General:   Alert,  Well-developed, well-nourished, pleasant and cooperative in NAD Skin:  Intact without significant lesions or rashes. Eyes:  Sclera clear, no icterus.   Conjunctiva pink. Ears:  Normal auditory acuity. Nose:  No deformity, discharge,  or lesions. Mouth:  No deformity or lesions. Neck:  Supple; no masses or thyromegaly. No significant cervical adenopathy. Lungs:  Clear throughout to auscultation.   No wheezes, crackles, or rhonchi. No acute distress. Heart:  Regular rate and rhythm; no murmurs, clicks, rubs,  or gallops. Abdomen: Non-distended, normal bowel sounds.  He does have bilateral lower quadrant and suprapubic tenderness to palpation. No appreciable mass. No guarding or rebound tenderness. Soft and nontender without appreciable mass or hepatosplenomegaly.  Pulses:  Normal pulses noted. Extremities:  Without clubbing or edema.  Impression/Plan:  Episodes of acute on chronic pancreatitis seemed to have improved since cholecystectomy. Etiology of pancreatitis not clear although the possibility of macro (or microlithiasis) would have to remain in the differential  at this time. Clinically, he is doing much better from a standpoint of his pancreatitis; weight gain is reassuring.  His main concern these days is suprapubic pain and difficulty urinating.    Recommendations: Stop Reglan. Continue Nexium and pancreatic enzyme supplementation. We'll recheck a hepatic profile near future given he had a slightly elevated alkaline phosphatase previously. Strongly recommended followup with Dr. Annabell Howells, his urologist. Diamantina Providence be on standby if he has recurrent symptoms referrable to his pancreas or elsewhere in his GI tract. At this time, plan to see this nice gentleman back in 12 months.

## 2012-06-19 NOTE — Patient Instructions (Addendum)
Continue pancreatic enzymes and Nexium  See Dr. Annabell Howells regarding urinary bladder  Liver enzyme check ;  Stop Reglan  Further recommendations to follow

## 2012-07-09 ENCOUNTER — Encounter: Payer: Self-pay | Admitting: Hematology and Oncology

## 2012-07-09 DIAGNOSIS — I2699 Other pulmonary embolism without acute cor pulmonale: Secondary | ICD-10-CM

## 2012-07-09 DIAGNOSIS — M7989 Other specified soft tissue disorders: Secondary | ICD-10-CM

## 2012-09-04 DIAGNOSIS — I2699 Other pulmonary embolism without acute cor pulmonale: Secondary | ICD-10-CM

## 2012-09-04 DIAGNOSIS — N3090 Cystitis, unspecified without hematuria: Secondary | ICD-10-CM

## 2012-09-04 DIAGNOSIS — D6859 Other primary thrombophilia: Secondary | ICD-10-CM

## 2012-09-04 DIAGNOSIS — N411 Chronic prostatitis: Secondary | ICD-10-CM

## 2012-10-02 DIAGNOSIS — I2699 Other pulmonary embolism without acute cor pulmonale: Secondary | ICD-10-CM

## 2012-10-02 DIAGNOSIS — N318 Other neuromuscular dysfunction of bladder: Secondary | ICD-10-CM

## 2012-10-02 DIAGNOSIS — Z7901 Long term (current) use of anticoagulants: Secondary | ICD-10-CM

## 2012-10-02 DIAGNOSIS — D6859 Other primary thrombophilia: Secondary | ICD-10-CM

## 2012-10-16 DIAGNOSIS — D6859 Other primary thrombophilia: Secondary | ICD-10-CM

## 2012-10-16 DIAGNOSIS — I2699 Other pulmonary embolism without acute cor pulmonale: Secondary | ICD-10-CM

## 2012-10-16 DIAGNOSIS — N318 Other neuromuscular dysfunction of bladder: Secondary | ICD-10-CM

## 2012-10-16 DIAGNOSIS — N411 Chronic prostatitis: Secondary | ICD-10-CM

## 2012-11-01 ENCOUNTER — Telehealth: Payer: Self-pay | Admitting: General Practice

## 2012-11-01 NOTE — Telephone Encounter (Signed)
I spoke with Lubertha Basque and she stated the patient needed was approved for 100% financial assistance through [facility].  I called to let the patient know this and he stated he would like Dr. Jena Gauss to perform his tcs, however he would like to be put to sleep for the procedure because they had trouble sedating him when he had his tcs at [facility].   Darl Pikes has made the patient an appt to come in and set up his tcs.

## 2012-11-14 ENCOUNTER — Encounter (HOSPITAL_COMMUNITY): Payer: Self-pay | Admitting: Pharmacy Technician

## 2012-11-14 ENCOUNTER — Ambulatory Visit (INDEPENDENT_AMBULATORY_CARE_PROVIDER_SITE_OTHER): Payer: No Typology Code available for payment source | Admitting: Gastroenterology

## 2012-11-14 ENCOUNTER — Encounter: Payer: Self-pay | Admitting: Gastroenterology

## 2012-11-14 VITALS — BP 132/79 | HR 76 | Temp 98.3°F | Ht 72.0 in | Wt 206.0 lb

## 2012-11-14 DIAGNOSIS — K625 Hemorrhage of anus and rectum: Secondary | ICD-10-CM

## 2012-11-14 MED ORDER — HYDROCORTISONE ACETATE 25 MG RE SUPP: 25.0000 mg | Freq: Two times a day (BID) | RECTAL | Status: DC

## 2012-11-14 MED ORDER — PEG 3350-KCL-NA BICARB-NACL 420 G PO SOLR: 4000.0000 mL | ORAL | Status: DC

## 2012-11-14 NOTE — Progress Notes (Signed)
Referring Provider: [name], MD Primary Care Physician:  Lynett Fish, MD Primary GI: Dr. Jena Gauss   Chief Complaint  Patient presents with  . Colonoscopy    HPI:   52 year old male with hx of chronic pancreatitis, GERD, presenting today for consideration of colonoscopy due to hematochezia. Last TCS by Dr. Eliseo Squires at [facility] normal in April 2012. Pt states he was awake during the procedure; difficult sedation. Feels like the colonoscopy was not adequate.  Notes he has to sit up in bed each night due to lower abdominal pain since the bladder surgery in March 2013. Any weight causes pain like a dagger. Notes pain with urination, causes tears coming out of his eyes. Present since March of last year. Sees urologist, Dr. Annabell Howells, in [place]. Notes intermittent hematochezia since the surgery. +paper hematochezia. +rectal discomfort with defecation, like razor blades. Denies change in bowel habits. Denies constipation. Sometimes doesn't want to go because he feels like it will hurt.   Past Medical History  Diagnosis Date  . Pulmonary embolism 12/2010  . Cystic thyroid nodule     left  . HTN (hypertension)   . Heterozygous factor V Leiden mutation 12/2010  . Pancreatitis 05/2011    EUS Dr Christella Hartigan 06/09/11-> dilated main pancreatic duct in HOP, 2-3 filling defects distal pancreatic duct, peripancreatic fluid resolved  . C. difficile colitis 01/2011?  Marland Kitchen Arthritis   . Clotting disorder   . GERD (gastroesophageal reflux disease)   . Chills   . Fever   . Weight loss   . Hearing loss   . Nasal congestion   . Leg swelling   . Palpitations   . Abdominal distension   . Abdominal pain   . Nausea & vomiting   . Rectal pain   . Difficulty urinating   . Arthritis pain   . Headache   . Weakness   . Bruises easily   . Anemia   . Dizziness   . Numbness and tingling     both hands, and both legs and feet  . Joint pain   . Blood in urine     hx of, none current  . Frequent urination at night     . Pneumonia as child  . Abnormal EKG     hx left bundle branch block on ekg  . Sleep apnea     STOPBANG=5  . Heterozygous factor V Leiden mutation   . Prostatitis, chronic   . Pancreatitis     Past Surgical History  Procedure Date  . Neck surgery 1999    C5/C6 fusion, limited neck mobility especially turning to left  . Knee surgery 1980's    arthroscopy, left knee  . Femur fracture surgery age 59    right leg  . Nerve surgery 1980's    right thumb  . Colonoscopy 01/2011    Dr. Zenon Mayo at [facility]-->normal  . Esophagogastroduodenoscopy 01/2011    Dr. Zenon Mayo at [facility]-->bile reflux in stomach-->patient c/o inadequate conscious sedation (Fentanyl 100mg /Versed 8mg )  . Ercp 07/2011    Dr. Logan Bores [facility]-> pancreatic enterotomy, pancreatic duct stones removed, bile duct not manipulated, no stents  . Cholecystectomy 01/25/2012    Procedure: LAPAROSCOPIC CHOLECYSTECTOMY WITH INTRAOPERATIVE CHOLANGIOGRAM;  Surgeon: Robyne Askew, MD;  Location: [facility];  Service: General;  Laterality: N/A;  . Cystoscopy with hydrodistension and biopsy 01/25/2012    Procedure: CYSTOSCOPY/BIOPSY/HYDRODISTENSION;  Surgeon: Anner Crete, MD;  Location: [facility];  Service: Urology;  Laterality: N/A;  Cystoscopy and Hydrodistension  of the Bladder    Current Outpatient Prescriptions  Medication Sig Dispense Refill  . amLODipine (NORVASC) 10 MG tablet Take 10 mg by mouth every morning.       . diazepam (VALIUM) 5 MG tablet Take 5 mg by mouth every 6 (six) hours as needed. Takes two tablets at bedtime for sleep and anxiety      . esomeprazole (NEXIUM) 40 MG capsule Take 40 mg by mouth 2 (two) times daily as needed. For indigestion      . Methadone HCl (DOLOPHINE PO) Take 70 mg by mouth.      . metoCLOPramide (REGLAN) 10 MG tablet Take 10 mg by mouth 4 (four) times daily as needed. For nausea      . oxycodone (ROXICODONE) 30 MG immediate release tablet Take 30 mg by mouth every 4 (four) hours as needed. For pain       . prochlorperazine (COMPAZINE) 10 MG tablet Take 5 mg by mouth 3 (three) times daily.       . silodosin (RAPAFLO) 8 MG CAPS capsule Take 8 mg by mouth as needed.       . warfarin (COUMADIN) 5 MG tablet Take 5 mg by mouth daily. 5 mg one day then 2.5 mg the next day      . zolpidem (AMBIEN) 10 MG tablet Take 10 mg by mouth at bedtime as needed. For sleep      . phenazopyridine (PYRIDIUM) 100 MG tablet Take 200 mg by mouth every evening.       Marland Kitchen tiZANidine (ZANAFLEX) 4 MG tablet Take 4 mg by mouth every 6 (six) hours as needed.        Allergies as of 11/14/2012  . (No Known Allergies)    Family History  Problem Relation Age of Onset  . GI problems Brother   . Colon cancer Neg Hx   . Liver disease Neg Hx   . GI problems      stomach and lung cancer, aunts/uncles  . Heart attack Brother     before age of 4  . Stroke Brother   . Hypertension Mother   . Hypertension Father   . Cancer Maternal Aunt     colon/pancreas/lung/stomach  . Heart disease Maternal Uncle   . Cancer Maternal Grandfather     lung    History   Social History  . Marital Status: Divorced    Spouse Name: N/A    Number of Children: 2  . Years of Education: N/A   Occupational History  .      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 Smoking status: Current Every Day Smoker -- 0.5 packs/day for 35 years    Types: Cigarettes    Last Attempt to Quit: 01/27/2011  . Smokeless tobacco: Never Used     Comment: smoking one pack daily/ took self off of patch  . Alcohol Use: No     Comment: None since hospitalized with pancreatitis in 05/2011. Prior to that, 1-2 wine coolers few days a week. Denies heavy or daily consumption.  . Drug Use: No  . Sexually Active: None   Other Topics Concern  . None   Social History Narrative  . None    Review of Systems: Gen: SEE HPI CV: Denies chest pain, palpitations, syncope, peripheral edema, and  claudication. Resp: +congestion GI: SEE HPI Derm: Denies rash, itching, dry skin Psych: +  anxiety Heme: Denies bruising, bleeding, and enlarged lymph nodes.  Physical Exam: BP 132/79  Pulse 76  Temp 98.3 F (36.8 C) (Oral)  Ht 6' (1.829 m)  Wt 206 lb (93.441 kg)  BMI 27.94 kg/m2 General:   Alert and oriented. No distress noted. Pleasant and cooperative.  Head:  Normocephalic and atraumatic. Eyes:  Conjuctiva clear without scleral icterus. Mouth:  Oral mucosa pink and moist. Good dentition. No lesions. Neck:  Supple, without mass or thyromegaly. Heart:  S1, S2 present without murmurs, rubs, or gallops. Regular rate and rhythm. Abdomen:  +BS, soft, TTP with only light touch, out of proportion to palpation. non-distended. No rebound or guarding. No HSM or masses noted. Rectal: PATIENT REFUSED A RECTAL EXAM Msk:  Symmetrical without gross deformities. Normal posture. Extremities:  Without edema. Neurologic:  Alert and  oriented x4;  grossly normal neurologically. Skin:  Intact without significant lesions or rashes. Cervical Nodes:  No significant cervical adenopathy. Psych:  Alert and cooperative. Normal mood and affect.

## 2012-11-14 NOTE — Patient Instructions (Addendum)
Start using anusol suppositories twice a day for 7 days. This is for rectal discomfort.  We have set you up for a colonoscopy with Dr. Jena Gauss in the near future.  You will need to hold you Coumadin for 5 days prior. We are contacting Dr. Cleone Slim for assistance with Lovenox shots, if needed.

## 2012-11-16 ENCOUNTER — Encounter (HOSPITAL_COMMUNITY): Payer: Self-pay

## 2012-11-16 ENCOUNTER — Other Ambulatory Visit: Payer: Self-pay

## 2012-11-16 ENCOUNTER — Encounter (HOSPITAL_COMMUNITY)
Admission: RE | Admit: 2012-11-16 | Discharge: 2012-11-16 | Disposition: A | Discharge status: Home / Self Care | Payer: Self-pay | Source: Ambulatory Visit | Attending: Internal Medicine | Admitting: Internal Medicine

## 2012-11-16 LAB — HEMOGLOBIN AND HEMATOCRIT, BLOOD: Hemoglobin: 12.7 g/dL — ABNORMAL LOW (ref 13.0–17.0)

## 2012-11-16 LAB — BASIC METABOLIC PANEL
BUN: 13 mg/dL (ref 6–23)
Chloride: 102 mEq/L (ref 96–112)
Creatinine, Ser: 0.78 mg/dL (ref 0.50–1.35)
GFR calc Af Amer: >90 mL/min (ref >90)

## 2012-11-16 NOTE — Patient Instructions (Signed)
Alex Hoffman  11/16/2012   Your procedure is scheduled on:  11/22/2012  [facility] to [facility] at 6:15 am AM.  Call this number if you have problems the morning of surgery: [phone]   Remember:   Do not eat food or drink liquids after midnight.   Take these medicines the morning of surgery with A SIP OF WATER:  Norvasc, Valium, Nexium, Methadone, Reglan, Oxycodone, compazine.  Stop Coumadin on 11/17/2012.  Start Lovenox 120 mg on 11/18/2012 daily per Tobi Bastos Sams PA  [place])   Do not wear jewelry, make-up or nail polish.  Do not wear lotions, powders, or perfumes. You may wear deodorant.  Do not shave 48 hours prior to surgery. Men may shave face and neck.  Do not bring valuables to the hospital.  Contacts, dentures or bridgework may not be worn into surgery.  Leave suitcase in the car. After surgery it may be brought to your room.  For patients admitted to the hospital, checkout time is 11:00 AM the day of  discharge.   Patients discharged the day of surgery will not be allowed to drive  home.  Name and phone number of your driver: Fiance Raeanne Barry  Special Instructions: N/A   Please read over the following fact sheets that you were given: Care and Recovery After Surgery

## 2012-11-16 NOTE — Progress Notes (Signed)
Faxed to PCP and referring provider 

## 2012-11-16 NOTE — Assessment & Plan Note (Addendum)
51 year old with hx of clotting disorder necessitating the need for chronic Coumadin, now presenting with intermittent mild rectal bleeding, rectal discomfort with defecation. His last colonoscopy was at an outside facility by Dr. Eliseo Squires at [facility], normal in April 2012. As this is close to 2 years out from his last lower GI tract evaluation, I think it is reasonable to reassess in the way of a colonoscopy, especially with his use of Coumadin. HE REFUSED A RECTAL EXAM. Likely dealing with the possibility of an anal fissure, internal hemorrhoids, less likely occult malignancy. I have provided anusol suppositories in the interim.   Proceed with TCS with Dr. Jena Gauss WITH PROPOFOL (due to hx of difficult sedation) in near future: the risks, benefits, and alternatives have been discussed with the patient in detail. The patient states understanding and desires to proceed.   Coumadin instructions were discussed over the phone with the nurse at pre-op, patient present as well. Pt has Lovenox at home, which he uses for procedures. Last dose of Coumadin on the 18th. Begin Lovenox 120mg  each morning on the 19th. Last dose of Lovenox on the 22nd. Procedure 23rd. We have contacted Dr. Cleone Slim as well to notify him of Lovenox bridging.  ALSO NOTABLE: Dr. Bertha Stakes referring letter to [place] noted to resume Coumadin the same evening as the colonoscopy at 5mg , then alternate with 2.5 mg.

## 2012-11-19 ENCOUNTER — Telehealth: Payer: Self-pay

## 2012-11-19 NOTE — Telephone Encounter (Signed)
Noted. Yes, I spoke with a lady from pre-admissions testing about him on Friday.  Glad they are aware as well.

## 2012-11-19 NOTE — Telephone Encounter (Signed)
Bonnie from Dr. Bertha Stakes office called- they were going to get pts lovenox injections taken care of. She called the pt this am and he informed her that he started the lovenox injections 2 days ago because someone at the hospital told him to. Pt is scheduled for 11/22/12, She stated he was aware how to do injections because he has done them before and already had the lovenox on hand.

## 2012-11-22 ENCOUNTER — Encounter (HOSPITAL_COMMUNITY): Payer: Self-pay | Admitting: Anesthesiology

## 2012-11-22 ENCOUNTER — Ambulatory Visit (HOSPITAL_COMMUNITY): Payer: Self-pay | Admitting: Anesthesiology

## 2012-11-22 ENCOUNTER — Ambulatory Visit (HOSPITAL_COMMUNITY)
Admission: RE | Admit: 2012-11-22 | Discharge: 2012-11-22 | Disposition: A | Discharge status: Home / Self Care | Payer: Self-pay | Source: Ambulatory Visit | Attending: Internal Medicine | Admitting: Internal Medicine

## 2012-11-22 ENCOUNTER — Encounter (HOSPITAL_COMMUNITY): Payer: Self-pay | Admitting: *Deleted

## 2012-11-22 ENCOUNTER — Encounter (HOSPITAL_COMMUNITY)
Admission: RE | Disposition: A | Discharge status: Home / Self Care | Payer: Self-pay | Source: Ambulatory Visit | Attending: Internal Medicine

## 2012-11-22 DIAGNOSIS — K6289 Other specified diseases of anus and rectum: Secondary | ICD-10-CM

## 2012-11-22 DIAGNOSIS — K921 Melena: Secondary | ICD-10-CM | POA: Insufficient documentation

## 2012-11-22 DIAGNOSIS — Z01812 Encounter for preprocedural laboratory examination: Secondary | ICD-10-CM | POA: Insufficient documentation

## 2012-11-22 DIAGNOSIS — I10 Essential (primary) hypertension: Secondary | ICD-10-CM | POA: Insufficient documentation

## 2012-11-22 HISTORY — PX: COLONOSCOPY WITH PROPOFOL: SHX5780

## 2012-11-22 SURGERY — COLONOSCOPY WITH PROPOFOL
Anesthesia: Monitor Anesthesia Care

## 2012-11-22 MED ORDER — MIDAZOLAM HCL 2 MG/2ML IJ SOLN
INTRAMUSCULAR | Status: AC
  Filled 2012-11-22: qty 2

## 2012-11-22 MED ORDER — PROPOFOL 10 MG/ML IV EMUL
INTRAVENOUS | Status: AC
  Filled 2012-11-22: qty 20

## 2012-11-22 MED ORDER — GLYCOPYRROLATE 0.2 MG/ML IJ SOLN
INTRAMUSCULAR | Status: AC
  Filled 2012-11-22: qty 1

## 2012-11-22 MED ORDER — ONDANSETRON HCL 4 MG/2ML IJ SOLN
4.0000 mg | Freq: Once | INTRAMUSCULAR | Status: AC
  Administered 2012-11-22: 4 mg via INTRAVENOUS

## 2012-11-22 MED ORDER — WATER FOR IRRIGATION, STERILE IR SOLN
Status: DC | PRN
  Administered 2012-11-22: 1000 mL

## 2012-11-22 MED ORDER — LIDOCAINE HCL (PF) 1 % IJ SOLN
INTRAMUSCULAR | Status: AC
  Filled 2012-11-22: qty 5

## 2012-11-22 MED ORDER — ONDANSETRON HCL 4 MG/2ML IJ SOLN
INTRAMUSCULAR | Status: AC
  Filled 2012-11-22: qty 2

## 2012-11-22 MED ORDER — LACTATED RINGERS IV SOLN
INTRAVENOUS | Status: DC
  Administered 2012-11-22: 1000 mL via INTRAVENOUS

## 2012-11-22 MED ORDER — MIDAZOLAM HCL 5 MG/5ML IJ SOLN
INTRAMUSCULAR | Status: DC | PRN
  Administered 2012-11-22 (×2): 1 mg via INTRAVENOUS

## 2012-11-22 MED ORDER — LACTATED RINGERS IV SOLN: INTRAVENOUS | Status: DC

## 2012-11-22 MED ORDER — FENTANYL CITRATE 0.05 MG/ML IJ SOLN
INTRAMUSCULAR | Status: AC
  Filled 2012-11-22: qty 2

## 2012-11-22 MED ORDER — GLYCOPYRROLATE 0.2 MG/ML IJ SOLN
0.2000 mg | Freq: Once | INTRAMUSCULAR | Status: AC
  Administered 2012-11-22: 0.2 mg via INTRAVENOUS

## 2012-11-22 MED ORDER — LACTATED RINGERS IV SOLN
INTRAVENOUS | Status: DC | PRN
  Administered 2012-11-22: 07:00:00 via INTRAVENOUS

## 2012-11-22 MED ORDER — FENTANYL CITRATE 0.05 MG/ML IJ SOLN: 25.0000 ug | INTRAMUSCULAR | Status: DC | PRN

## 2012-11-22 MED ORDER — STERILE WATER FOR IRRIGATION IR SOLN
Status: DC | PRN
  Administered 2012-11-22: 08:00:00

## 2012-11-22 MED ORDER — PROPOFOL INFUSION 10 MG/ML OPTIME
INTRAVENOUS | Status: DC | PRN
  Administered 2012-11-22: 75 ug/kg/min via INTRAVENOUS

## 2012-11-22 MED ORDER — ONDANSETRON HCL 4 MG/2ML IJ SOLN: 4.0000 mg | Freq: Once | INTRAMUSCULAR | Status: DC | PRN

## 2012-11-22 MED ORDER — LIDOCAINE HCL (CARDIAC) 10 MG/ML IV SOLN
INTRAVENOUS | Status: DC | PRN
  Administered 2012-11-22: 50 mg via INTRAVENOUS

## 2012-11-22 MED ORDER — MIDAZOLAM HCL 2 MG/2ML IJ SOLN
1.0000 mg | INTRAMUSCULAR | Status: DC | PRN
  Administered 2012-11-22 (×2): 2 mg via INTRAVENOUS

## 2012-11-22 SURGICAL SUPPLY — 22 items
ELECT REM PT RETURN 9FT ADLT (ELECTROSURGICAL)
ELECTRODE REM PT RTRN 9FT ADLT (ELECTROSURGICAL) IMPLANT
FCP BXJMBJMB 240X2.8X (CUTTING FORCEPS)
FLOOR PAD 36X40 (MISCELLANEOUS) ×2
FORCEPS BIOP RAD 4 LRG CAP 4 (CUTTING FORCEPS) IMPLANT
FORCEPS BIOP RJ4 240 W/NDL (CUTTING FORCEPS)
FORCEPS BXJMBJMB 240X2.8X (CUTTING FORCEPS) IMPLANT
INJECTOR/SNARE I SNARE (MISCELLANEOUS) IMPLANT
LUBRICANT JELLY 4.5OZ STERILE (MISCELLANEOUS) ×2 IMPLANT
MANIFOLD NEPTUNE II (INSTRUMENTS) ×1 IMPLANT
NDL SCLEROTHERAPY 25GX240 (NEEDLE) IMPLANT
NEEDLE SCLEROTHERAPY 25GX240 (NEEDLE) IMPLANT
PAD FLOOR 36X40 (MISCELLANEOUS) IMPLANT
PROBE APC STR FIRE (PROBE) IMPLANT
PROBE INJECTION GOLD (MISCELLANEOUS)
PROBE INJECTION GOLD 7FR (MISCELLANEOUS) IMPLANT
SNARE ROTATE MED OVAL 20MM (MISCELLANEOUS) IMPLANT
SYR 50ML LL SCALE MARK (SYRINGE) ×1 IMPLANT
TRAP SPECIMEN MUCOUS 40CC (MISCELLANEOUS) IMPLANT
TUBING ENDO SMARTCAP PENTAX (MISCELLANEOUS) IMPLANT
TUBING IRRIGATION ENDOGATOR (MISCELLANEOUS) ×2 IMPLANT
WATER STERILE IRR 1000ML POUR (IV SOLUTION) ×2 IMPLANT

## 2012-11-22 NOTE — H&P (View-Only) (Signed)
Referring Provider: Alabanza, Thomas, MD Primary Care Physician:  KARB,KENNETH S, MD Primary GI: Dr. Rourk   Chief Complaint  Patient presents with  . Colonoscopy    HPI:   52-year-old male with hx of chronic pancreatitis, GERD, presenting today for consideration of colonoscopy due to hematochezia. Last TCS by Dr. Beachum at [facility] normal in April 2012. Pt states he was awake during the procedure; difficult sedation. Feels like the colonoscopy was not adequate.  Notes he has to sit up in bed each night due to lower abdominal pain since the bladder surgery in March 2013. Any weight causes pain like a dagger. Notes pain with urination, causes tears coming out of his eyes. Present since March of last year. Sees urologist, Dr. Wrenn, in [place]. Notes intermittent hematochezia since the surgery. +paper hematochezia. +rectal discomfort with defecation, like razor blades. Denies change in bowel habits. Denies constipation. Sometimes doesn't want to go because he feels like it will hurt.   Past Medical History  Diagnosis Date  . Pulmonary embolism 12/2010  . Cystic thyroid nodule     left  . HTN (hypertension)   . Heterozygous factor V Leiden mutation 12/2010  . Pancreatitis 05/2011    EUS Dr Jacobs 06/09/11-> dilated main pancreatic duct in HOP, 2-3 filling defects distal pancreatic duct, peripancreatic fluid resolved  . C. difficile colitis 01/2011?  . Arthritis   . Clotting disorder   . GERD (gastroesophageal reflux disease)   . Chills   . Fever   . Weight loss   . Hearing loss   . Nasal congestion   . Leg swelling   . Palpitations   . Abdominal distension   . Abdominal pain   . Nausea & vomiting   . Rectal pain   . Difficulty urinating   . Arthritis pain   . Headache   . Weakness   . Bruises easily   . Anemia   . Dizziness   . Numbness and tingling     both hands, and both legs and feet  . Joint pain   . Blood in urine     hx of, none current  . Frequent urination at night     . Pneumonia as child  . Abnormal EKG     hx left bundle branch block on ekg  . Sleep apnea     STOPBANG=5  . Heterozygous factor V Leiden mutation   . Prostatitis, chronic   . Pancreatitis     Past Surgical History  Procedure Date  . Neck surgery 1999    C5/C6 fusion, limited neck mobility especially turning to left  . Knee surgery 1980's    arthroscopy, left knee  . Femur fracture surgery age 4    right leg  . Nerve surgery 1980's    right thumb  . Colonoscopy 01/2011    Dr. Brian Beachum at [facility]-->normal  . Esophagogastroduodenoscopy 01/2011    Dr. Brian Beachum at [facility]-->bile reflux in stomach-->patient c/o inadequate conscious sedation (Fentanyl 100mg/Versed 8mg)  . Ercp 07/2011    Dr. Cobarrubias [facility]-> pancreatic enterotomy, pancreatic duct stones removed, bile duct not manipulated, no stents  . Cholecystectomy 01/25/2012    Procedure: LAPAROSCOPIC CHOLECYSTECTOMY WITH INTRAOPERATIVE CHOLANGIOGRAM;  Surgeon: Paul S Toth III, MD;  Location: [facility];  Service: General;  Laterality: N/A;  . Cystoscopy with hydrodistension and biopsy 01/25/2012    Procedure: CYSTOSCOPY/BIOPSY/HYDRODISTENSION;  Surgeon: John J Wrenn, MD;  Location: [facility];  Service: Urology;  Laterality: N/A;  Cystoscopy and Hydrodistension   of the Bladder    Current Outpatient Prescriptions  Medication Sig Dispense Refill  . amLODipine (NORVASC) 10 MG tablet Take 10 mg by mouth every morning.       . diazepam (VALIUM) 5 MG tablet Take 5 mg by mouth every 6 (six) hours as needed. Takes two tablets at bedtime for sleep and anxiety      . esomeprazole (NEXIUM) 40 MG capsule Take 40 mg by mouth 2 (two) times daily as needed. For indigestion      . Methadone HCl (DOLOPHINE PO) Take 70 mg by mouth.      . metoCLOPramide (REGLAN) 10 MG tablet Take 10 mg by mouth 4 (four) times daily as needed. For nausea      . oxycodone (ROXICODONE) 30 MG immediate release tablet Take 30 mg by mouth every 4 (four) hours as needed. For pain       . prochlorperazine (COMPAZINE) 10 MG tablet Take 5 mg by mouth 3 (three) times daily.       . silodosin (RAPAFLO) 8 MG CAPS capsule Take 8 mg by mouth as needed.       . warfarin (COUMADIN) 5 MG tablet Take 5 mg by mouth daily. 5 mg one day then 2.5 mg the next day      . zolpidem (AMBIEN) 10 MG tablet Take 10 mg by mouth at bedtime as needed. For sleep      . phenazopyridine (PYRIDIUM) 100 MG tablet Take 200 mg by mouth every evening.       . tiZANidine (ZANAFLEX) 4 MG tablet Take 4 mg by mouth every 6 (six) hours as needed.        Allergies as of 11/14/2012  . (No Known Allergies)    Family History  Problem Relation Age of Onset  . GI problems Brother   . Colon cancer Neg Hx   . Liver disease Neg Hx   . GI problems      stomach and lung cancer, aunts/uncles  . Heart attack Brother     before age of 60  . Stroke Brother   . Hypertension Mother   . Hypertension Father   . Cancer Maternal Aunt     colon/pancreas/lung/stomach  . Heart disease Maternal Uncle   . Cancer Maternal Grandfather     lung    History   Social History  . Marital Status: Divorced    Spouse Name: N/A    Number of Children: 2  . Years of Education: N/A   Occupational History  .      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 Smoking status: Current Every Day Smoker -- 0.5 packs/day for 35 years    Types: Cigarettes    Last Attempt to Quit: 01/27/2011  . Smokeless tobacco: Never Used     Comment: smoking one pack daily/ took self off of patch  . Alcohol Use: No     Comment: None since hospitalized with pancreatitis in 05/2011. Prior to that, 1-2 wine coolers few days a week. Denies heavy or daily consumption.  . Drug Use: No  . Sexually Active: None   Other Topics Concern  . None   Social History Narrative  . None    Review of Systems: Gen: SEE HPI CV: Denies chest pain, palpitations, syncope, peripheral edema, and  claudication. Resp: +congestion GI: SEE HPI Derm: Denies rash, itching, dry skin Psych: +  anxiety Heme: Denies bruising, bleeding, and enlarged lymph nodes.  Physical Exam: BP 132/79  Pulse 76  Temp 98.3 F (36.8 C) (Oral)  Ht 6' (1.829 m)  Wt 206 lb (93.441 kg)  BMI 27.94 kg/m2 General:   Alert and oriented. No distress noted. Pleasant and cooperative.  Head:  Normocephalic and atraumatic. Eyes:  Conjuctiva clear without scleral icterus. Mouth:  Oral mucosa pink and moist. Good dentition. No lesions. Neck:  Supple, without mass or thyromegaly. Heart:  S1, S2 present without murmurs, rubs, or gallops. Regular rate and rhythm. Abdomen:  +BS, soft, TTP with only light touch, out of proportion to palpation. non-distended. No rebound or guarding. No HSM or masses noted. Rectal: PATIENT REFUSED A RECTAL EXAM Msk:  Symmetrical without gross deformities. Normal posture. Extremities:  Without edema. Neurologic:  Alert and  oriented x4;  grossly normal neurologically. Skin:  Intact without significant lesions or rashes. Cervical Nodes:  No significant cervical adenopathy. Psych:  Alert and cooperative. Normal mood and affect.  

## 2012-11-22 NOTE — Transfer of Care (Signed)
  Anesthesia Post-op Note  Patient: Alex Hoffman  Procedure(s) Performed: Procedure(s) (LRB) with comments: COLONOSCOPY WITH PROPOFOL (N/A) - In [place] @ 0758 Total withdrawl Time: 12 minutes  Patient Location: PACU  Anesthesia Type: MAC  Level of Consciousness: awake, alert , oriented and patient cooperative  Airway and Oxygen Therapy: Patient Spontanous Breathing room air  Post-op Pain: mild  Post-op Assessment: Post-op Vital signs reviewed, Patient's Cardiovascular Status Stable, Respiratory Function Stable, Patent Airway and No signs of Nausea or vomiting  Post-op Vital Signs: Reviewed and stable  Complications: No apparent anesthesia complications  

## 2012-11-22 NOTE — Addendum Note (Signed)
Addendum  created 11/22/12 0827 by Amy L Andraza, CRNA   Modules edited:Anesthesia Review and Sign Navigator Section    

## 2012-11-22 NOTE — Anesthesia Preprocedure Evaluation (Addendum)
Anesthesia Evaluation  Patient identified by MRN, date of birth, ID band Patient awake    Reviewed: Allergy & Precautions, H&P , NPO status , Patient's Chart, lab work & pertinent test results  History of Anesthesia Complications Negative for: history of anesthetic complications  Airway Mallampati: II TM Distance: >3 FB     Dental  (+) Teeth Intact and Poor Dentition   Pulmonary neg pulmonary ROS, pneumonia -, resolved, Current Smoker,  breath sounds clear to auscultation        Cardiovascular hypertension, Pt. on medications Rhythm:Regular Rate:Normal     Neuro/Psych  Headaches,    GI/Hepatic GERD-  Medicated and Controlled,  Endo/Other    Renal/GU      Musculoskeletal   Abdominal   Peds  Hematology   Anesthesia Other Findings   Reproductive/Obstetrics                           Anesthesia Physical Anesthesia Plan  ASA: III  Anesthesia Plan: MAC   Post-op Pain Management:    Induction: Intravenous  Airway Management Planned: Simple Face Mask  Additional Equipment:   Intra-op Plan:   Post-operative Plan:   Informed Consent: I have reviewed the patients History and Physical, chart, labs and discussed the procedure including the risks, benefits and alternatives for the proposed anesthesia with the patient or authorized representative who has indicated his/her understanding and acceptance.     Plan Discussed with:   Anesthesia Plan Comments:        Anesthesia Quick Evaluation

## 2012-11-22 NOTE — Preoperative (Signed)
Beta Blockers   Reason not to administer Beta Blockers:Not Applicable 

## 2012-11-22 NOTE — Interval H&P Note (Signed)
History and Physical Interval Note:  11/22/2012 7:38 AM  Alex Hoffman  has presented today for surgery, with the diagnosis of Rectal Bleeding  The various methods of treatment have been discussed with the patient and family. After consideration of risks, benefits and other options for treatment, the patient has consented to  Procedure(s) (LRB) with comments: COLONOSCOPY WITH PROPOFOL (N/A) - 7:30 as a surgical intervention .  The patient's history has been reviewed, patient examined, no change in status, stable for surgery.  I have reviewed the patient's chart and labs.  Questions were answered to the patient's satisfaction.     Eula Listen  Plans for diagnostic colonoscopy today reviewed with patient and family at the bedside.The risks, benefits, limitations, alternatives and imponderables have been reviewed with the patient. Questions have been answered. All parties are agreeable.

## 2012-11-22 NOTE — Anesthesia Postprocedure Evaluation (Signed)
  Anesthesia Post-op Note  Patient: Alex Hoffman  Procedure(s) Performed: Procedure(s) (LRB) with comments: COLONOSCOPY WITH PROPOFOL (N/A) - In [facility] @ 0758 Total withdrawl Time: 12 minutes  Patient Location: PACU  Anesthesia Type: MAC  Level of Consciousness: awake, alert , oriented and patient cooperative  Airway and Oxygen Therapy: Patient Spontanous Breathing room air  Post-op Pain: mild  Post-op Assessment: Post-op Vital signs reviewed, Patient's Cardiovascular Status Stable, Respiratory Function Stable, Patent Airway and No signs of Nausea or vomiting  Post-op Vital Signs: Reviewed and stable  Complications: No apparent anesthesia complications

## 2012-11-22 NOTE — Anesthesia Procedure Notes (Signed)
Procedure Name: MAC Date/Time: 11/22/2012 7:41 AM Performed by: Carolyne Littles, Shanan Fitzpatrick L Pre-anesthesia Checklist: Patient identified, Patient being monitored, Emergency Drugs available, Timeout performed and Suction available Oxygen Delivery Method: Non-rebreather mask

## 2012-11-22 NOTE — Addendum Note (Signed)
Addendum  created 11/22/12 0827 by Marolyn Hammock, CRNA   Modules edited:Anesthesia Review and Sign Navigator Section

## 2012-11-22 NOTE — Op Note (Signed)
[facility] [address] [place], [address]   COLONOSCOPY PROCEDURE REPORT  PATIENT: Alex Hoffman, Alex Hoffman  MR#:         [id number] BIRTHDATE: 07/04/1961 , 51  yrs. old GENDER: Male ENDOSCOPIST: R.  Roetta Sessions, MD FACP [facility] REFERRED BY:  Isabel Caprice, M.D. 1, Dr. Charlynne Cousins PROCEDURE DATE:  11/22/2012 PROCEDURE:     ileocolonoscopy-diagnostic  INDICATIONS:  anorectal pain; hematochezia  INFORMED CONSENT:  The risks, benefits, alternatives and imponderables including but not limited to bleeding, perforation as well as the possibility of a missed lesion have been reviewed.  The potential for biopsy, lesion removal, etc. have also been discussed.  Questions have been answered.  All parties agreeable. Please see the history and physical in the medical record for more information.  MEDICATIONS: Deep sedation with propofol per Dr. [facility]  DESCRIPTION OF PROCEDURE:  After a digital rectal exam was performed, the     colonoscope was advanced from the anus through the rectum and colon to the area of the cecum, ileocecal valve and appendiceal orifice.  The cecum was deeply intubated.  These structures were well-seen and photographed for the record.  From the level of the cecum and ileocecal valve, the scope was slowly and cautiously withdrawn.  The mucosal surfaces were carefully surveyed utilizing scope tip deflection to facilitate fold flattening as needed.  The scope was pulled down into the rectum where a thorough examination including retroflexion was performed.     FINDINGS:  Adequate preparation. Somewhat friable anal canal anal; tender digital rectal examination. Normal rectum. No significant hemorrhoids. I was unable to identify an anal fissure. Scattered sigmoid diverticulosis; otherwise, the remainder of the colonic mucosa appeared normal. The distal 10 cm of terminal ileal mucosa appeared normal.  THERAPEUTIC / DIAGNOSTIC MANEUVERS  PERFORMED:  None  COMPLICATIONS: None  CECAL WITHDRAWAL TIME:  11 minutes  IMPRESSION:  Somewhat friable anal rectum; fissure not identified although he certainly could have a chronic anal fissure to account for some of his symptoms of proctalgia and hematochezia. Some of his anorectal symptoms could be referred pain from his urinary bladder  RECOMMENDATIONS: Canasa 1 g of mesalamine suppository-1 per rectum at bedtime  Nitroglycerin 0.4% ointment applied to the anorectum twice daily. Watch for low blood pressure, dizziness and headache. May use Tylenol as needed for any headache which occur with this medication.  Daily fiber supplement-Benefiber 2 tablespoons  Office visit with [place] in 4-6 weeks to reassess symptoms  Patient instructed to resume Coumadin today. Patient also instructed to resume Lovenox today and continue until INR is therapeutic. INR will be checked at Dr. Bertha Stakes office on Monday, January 27.   _______________________________ eSigned:  R. Roetta Sessions, MD FACP [facility] 11/22/2012 8:42 AM   CC:    PATIENT NAME:  Male, Minish MR#: [id number]

## 2012-11-26 ENCOUNTER — Encounter (HOSPITAL_COMMUNITY): Payer: Self-pay | Admitting: Internal Medicine

## 2012-12-04 DIAGNOSIS — Z7901 Long term (current) use of anticoagulants: Secondary | ICD-10-CM

## 2012-12-04 DIAGNOSIS — D6859 Other primary thrombophilia: Secondary | ICD-10-CM

## 2012-12-04 DIAGNOSIS — I2699 Other pulmonary embolism without acute cor pulmonale: Secondary | ICD-10-CM

## 2012-12-04 DIAGNOSIS — I82409 Acute embolism and thrombosis of unspecified deep veins of unspecified lower extremity: Secondary | ICD-10-CM

## 2012-12-15 ENCOUNTER — Other Ambulatory Visit: Payer: Self-pay

## 2012-12-25 ENCOUNTER — Encounter: Payer: Self-pay | Admitting: Internal Medicine

## 2012-12-26 ENCOUNTER — Encounter: Payer: Self-pay | Admitting: Internal Medicine

## 2012-12-27 ENCOUNTER — Encounter: Payer: Self-pay | Admitting: Gastroenterology

## 2012-12-27 ENCOUNTER — Ambulatory Visit (INDEPENDENT_AMBULATORY_CARE_PROVIDER_SITE_OTHER): Payer: No Typology Code available for payment source | Admitting: Gastroenterology

## 2012-12-27 VITALS — BP 149/81 | HR 71 | Temp 97.8°F | Ht 72.0 in | Wt 201.8 lb

## 2012-12-27 DIAGNOSIS — G8929 Other chronic pain: Secondary | ICD-10-CM

## 2012-12-27 DIAGNOSIS — R109 Unspecified abdominal pain: Secondary | ICD-10-CM

## 2012-12-27 MED ORDER — LUBIPROSTONE 24 MCG PO CAPS: 24.0000 ug | ORAL_CAPSULE | Freq: Two times a day (BID) | ORAL | Status: DC

## 2012-12-27 NOTE — Assessment & Plan Note (Signed)
52 year old male with chronic lower abdominal pain since 2012. He has a history significant for lower back pain, prior urologic procedures, and proctalgia. Colonoscopy without significant findings other than friable rectum, no overt evidence of an anal fissure. His symptoms appear to be constant, with worsening of pain intermittently. He states he hurts with urination, defecation, sleeping, getting out of bed. I strongly question chronic abdominal pain secondary to lower back pain. He may have underlying IBS-C that can certainly worsen discomfort, but I do not feel this is the total culprit. With his urological history as well, it would be prudent to establish care again with a urologist. However, he has not been able to see Dr. Annabell Howells due to insurance.   Update CT abd/pelvis to assess for any occult issues BMP, HFP Amitiza 24 mcg daily, increase to BID if tolerates Continue Nexium Likely will refer to pain management, urology, and possibly spine specialist 3 month return with Dr. Jena Gauss only

## 2012-12-27 NOTE — Patient Instructions (Addendum)
Continue to take Nexium daily, 30 minutes before the first meal of the day.  I have provided samples of Amitiza, which is medication for constipation. Take this once a day WITH FOOD to avoid nausea. If you do well after a few days, you may increase to twice a day. Watch for diarrhea. Please fill out the forms so you can have assistance with the prescription cost.  We have ordered a CT of your abdomen. We will make further recommendations once this is completed. Please go to the lab and complete the blood work.  You will return in 3 months to see Dr. Jena Gauss. Please call us with any concerns in the meantime.

## 2012-12-27 NOTE — Progress Notes (Signed)
Referring Provider: Lynett Fish, MD Primary Care Physician:  Lynett Fish, MD Primary Gastroenterologist: Dr. Jena Gauss   Chief Complaint  Patient presents with  . Follow-up    HPI:   52 year old male who presents today in follow-up after colonoscopy secondary to rectal bleeding. Friable anal rectum without evidence of fissure, although possibility of chronic anal fissure unable to be excluded. He has finished the Canasa suppositories and was unable to tolerate the nitroglycerin cream. Recently got married and got back from his honeymoon. History of chronic lower abdominal pain radiating at times to lower back since 2012.  States since 2012, he has to be up every night due to abdominal pain. Has to be helped up from laying down. Notes as always present, worsening intermittently. Sees Dr. Cleone Slim now as PCP. Lower back arthritis was managed by Dr. Charlynne Cousins in [place]. Dr. Annabell Howells is urologist. Fredna Dow he needs another bladder procedure, but he is unable to see him due to lack of insurance. States cries with urination. BM every other day. Feels constipated. Feels bloated. Intermittent low-volume hematochezia. Continues to have knife-like pain with defecation. States everything hurts.    Past Medical History  Diagnosis Date  . Pulmonary embolism 12/2010  . Cystic thyroid nodule     left  . HTN (hypertension)   . Heterozygous factor V Leiden mutation 12/2010  . Pancreatitis 05/2011    [facility] Dr Christella Hartigan 06/09/11-> dilated main pancreatic duct in HOP, 2-3 filling defects distal pancreatic duct, peripancreatic fluid resolved  . C. difficile colitis 01/2011?  Marland Kitchen Arthritis   . Clotting disorder   . GERD (gastroesophageal reflux disease)   . Chills   . Fever   . Weight loss   . Hearing loss   . Nasal congestion   . Leg swelling   . Palpitations   . Abdominal distension   . Abdominal pain   . Nausea & vomiting   . Rectal pain   . Difficulty urinating   . Arthritis pain   . Headache   . Weakness   .  Bruises easily   . Anemia   . Dizziness   . Numbness and tingling     both hands, and both legs and feet  . Joint pain   . Blood in urine     hx of, none current  . Frequent urination at night   . Pneumonia as child  . Abnormal EKG     hx left bundle branch block on ekg  . Sleep apnea     STOPBANG=5  . Heterozygous factor V Leiden mutation   . Prostatitis, chronic   . Pancreatitis   . Pancreatitis 12/2011  . Arthritis     lower back. Dr. Charlynne Cousins in [place]    Past Surgical History  Procedure Laterality Date  . Neck surgery  1999    C5/C6 fusion, limited neck mobility especially turning to left  . Knee surgery  1980's    arthroscopy, left knee  . Femur fracture surgery  age 44    right leg  . Nerve surgery  1980's    right thumb  . Colonoscopy  01/2011    Dr. Zenon Mayo at [facility]-->normal  . Esophagogastroduodenoscopy  01/2011    Dr. Zenon Mayo at [facility]-->bile reflux in stomach-->patient c/o inadequate conscious sedation (Fentanyl 100mg /Versed 8mg )  . Ercp  07/2011    Dr. Logan Bores [facility]-> pancreatic enterotomy, pancreatic duct stones removed, bile duct not manipulated, no stents  . Cholecystectomy  01/25/2012    Procedure: LAPAROSCOPIC  CHOLECYSTECTOMY WITH INTRAOPERATIVE CHOLANGIOGRAM;  Surgeon: Robyne Askew, MD;  Location: [facility];  Service: General;  Laterality: N/A;  . Cystoscopy with hydrodistension and biopsy  01/25/2012    Procedure: CYSTOSCOPY/BIOPSY/HYDRODISTENSION;  Surgeon: Anner Crete, MD;  Location: [facility];  Service: Urology;  Laterality: N/A;  Cystoscopy and Hydrodistension of the Bladder  . Colonoscopy with propofol  11/22/2012    [id number]:[id number] anal rectum/fissure not identified although he certainly could have a chronic anal fissure     Current Outpatient Prescriptions  Medication Sig Dispense Refill  . amLODipine (NORVASC) 10 MG tablet Take 10 mg by mouth every morning.       . diazepam (VALIUM) 5 MG tablet Take 5 mg by mouth 2 (two) times daily as needed.        Marland Kitchen esomeprazole (NEXIUM) 40 MG capsule Take 40 mg by mouth 2 (two) times daily. For indigestion      . hydrocortisone (ANUSOL-HC) 25 MG suppository Place 1 suppository (25 mg total) rectally every 12 (twelve) hours. For 7 days  14 suppository  0  . ibuprofen (ADVIL,MOTRIN) 200 MG tablet Take 200 mg by mouth every 6 (six) hours as needed. Pain      . methadone (DOLOPHINE) 10 MG tablet Take 50-70 mg by mouth 3 (three) times daily. Takes 50 mg twice daily and at bedtime takes 70 mg.      . metoCLOPramide (REGLAN) 10 MG tablet Take 10 mg by mouth 4 (four) times daily as needed. For nausea      . oxycodone (ROXICODONE) 30 MG immediate release tablet Take 30 mg by mouth every 6 (six) hours as needed. For pain      . phenazopyridine (PYRIDIUM) 100 MG tablet Take 200 mg by mouth every evening.       . prochlorperazine (COMPAZINE) 10 MG tablet Take 5 mg by mouth 3 (three) times daily.       . silodosin (RAPAFLO) 8 MG CAPS capsule Take 8 mg by mouth as needed.       Marland Kitchen tiZANidine (ZANAFLEX) 4 MG tablet Take 4 mg by mouth 2 (two) times daily as needed.       . warfarin (COUMADIN) 5 MG tablet Take 5 mg by mouth daily.       Marland Kitchen zolpidem (AMBIEN) 5 MG tablet Take 5 mg by mouth at bedtime as needed. Sleep      . polyethylene glycol-electrolytes (TRILYTE) 420 G solution Take 4,000 mLs by mouth as directed.  4000 mL  0   No current facility-administered medications for this visit.    Allergies as of 12/27/2012  . (No Known Allergies)    Family History  Problem Relation Age of Onset  . GI problems Brother   . Colon cancer Neg Hx   . Liver disease Neg Hx   . GI problems      stomach and lung cancer, aunts/uncles  . Heart attack Brother     before age of 42  . Stroke Brother   . Hypertension Mother   . Hypertension Father   . Cancer Maternal Aunt     colon/pancreas/lung/stomach  . Heart disease Maternal Uncle   . Cancer Maternal Grandfather     lung    History   Social History  . Marital  Status: Divorced    Spouse Name: N/A    Number of Children: 2  . Years of Education: N/A   Occupational History  .      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 Smoking status: Current Every Day Smoker -- 0.20 packs/day for 35 years    Types: Cigarettes  . Smokeless tobacco: Never Used     Comment: About 2 cigarettes per day.   . Alcohol Use: No     Comment: None since hospitalized with pancreatitis in 05/2011. Prior to that, 1-2 wine coolers few days a week. Denies heavy or daily consumption.  . Drug Use: No  . Sexually Active: None   Other Topics Concern  . None   Social History Narrative  . None    Review of Systems: Negative unless mentioned in HPI  Physical Exam: BP 149/81  Pulse 71  Temp(Src) 97.8 F (36.6 C) (Oral)  Ht 6' (1.829 m)  Wt 201 lb 12.8 oz (91.536 kg)  BMI 27.36 kg/m2 General:   Alert and oriented. No distress noted. Pleasant and cooperative.  Head:  Normocephalic and atraumatic. Eyes:  Conjuctiva clear without scleral icterus. Mouth:  Oral mucosa pink and moist.  Heart:  S1, S2 present without murmurs, rubs, or gallops. Regular rate and rhythm. Abdomen:  +BS, soft, TTP WITH LIGHT TOUCH LEFT-SIDE. Out of proportion with exam. TTP LOWER ABDOMEN, almost suprapubic.non-distended.  Msk:  Symmetrical without gross deformities. Difficult to stand up straight after sitting for a period of time. Difficult to lay down and rise from exam table due to significant lower back pain. Extremities:  Without edema. Neurologic:  Alert and  oriented x4;  grossly normal neurologically. Skin:  Intact without significant lesions or rashes. Psych:  Alert and cooperative. Normal mood and affect.

## 2012-12-27 NOTE — Progress Notes (Signed)
Faxed to PCP

## 2012-12-31 LAB — BASIC METABOLIC PANEL
Calcium: 9.4 mg/dL (ref 8.4–10.5)
Glucose, Bld: 98 mg/dL (ref 70–99)
Potassium: 4.6 mEq/L (ref 3.5–5.3)
Sodium: 139 mEq/L (ref 135–145)

## 2012-12-31 LAB — HEPATIC FUNCTION PANEL
AST: 26 U/L (ref 0–37)
Albumin: 4.1 g/dL (ref 3.5–5.2)
Alkaline Phosphatase: 133 U/L — ABNORMAL HIGH (ref 39–117)
Bilirubin, Direct: 0.1 mg/dL (ref 0.0–0.3)
Indirect Bilirubin: 0.5 mg/dL (ref 0.0–0.9)
Total Bilirubin: 0.6 mg/dL (ref 0.3–1.2)

## 2013-01-03 ENCOUNTER — Ambulatory Visit (HOSPITAL_COMMUNITY)
Admission: RE | Admit: 2013-01-03 | Discharge: 2013-01-03 | Disposition: A | Discharge status: Home / Self Care | Payer: No Typology Code available for payment source | Source: Ambulatory Visit | Attending: Gastroenterology | Admitting: Gastroenterology

## 2013-01-03 DIAGNOSIS — R109 Unspecified abdominal pain: Secondary | ICD-10-CM | POA: Insufficient documentation

## 2013-01-03 MED ORDER — IOHEXOL 300 MG/ML  SOLN
100.0000 mL | Freq: Once | INTRAMUSCULAR | Status: AC | PRN
  Administered 2013-01-03: 100 mL via INTRAVENOUS

## 2013-01-11 DIAGNOSIS — Z7901 Long term (current) use of anticoagulants: Secondary | ICD-10-CM

## 2013-01-11 DIAGNOSIS — N411 Chronic prostatitis: Secondary | ICD-10-CM

## 2013-01-11 DIAGNOSIS — R197 Diarrhea, unspecified: Secondary | ICD-10-CM

## 2013-01-11 DIAGNOSIS — I2699 Other pulmonary embolism without acute cor pulmonale: Secondary | ICD-10-CM

## 2013-01-24 NOTE — Progress Notes (Signed)
Quick Note:  BMP normal. Appears isolated elevation of alk phos since last year, March 2013.  Hanging around the 130s.   Need to do the following: Check AMA [place] of abdomen ______

## 2013-01-24 NOTE — Progress Notes (Signed)
Quick Note:  Negative CT abd/pelvis. Needs to follow-up with urology. Keep appt with RMR only. ______

## 2013-01-25 ENCOUNTER — Other Ambulatory Visit: Payer: Self-pay | Admitting: Gastroenterology

## 2013-01-25 ENCOUNTER — Other Ambulatory Visit: Payer: Self-pay

## 2013-01-25 DIAGNOSIS — R945 Abnormal results of liver function studies: Secondary | ICD-10-CM

## 2013-01-25 NOTE — Progress Notes (Signed)
Quick Note:  Pt aware ______ 

## 2013-01-28 ENCOUNTER — Other Ambulatory Visit: Payer: Self-pay | Admitting: Gastroenterology

## 2013-01-28 DIAGNOSIS — R748 Abnormal levels of other serum enzymes: Secondary | ICD-10-CM

## 2013-01-28 NOTE — Progress Notes (Signed)
Patient is scheduled for Abd U/S on 04/03 at 9:00 am and he is aware

## 2013-01-31 ENCOUNTER — Ambulatory Visit (HOSPITAL_COMMUNITY)
Admission: RE | Admit: 2013-01-31 | Discharge: 2013-01-31 | Disposition: A | Discharge status: Home / Self Care | Payer: No Typology Code available for payment source | Source: Ambulatory Visit | Attending: Gastroenterology | Admitting: Gastroenterology

## 2013-01-31 DIAGNOSIS — R748 Abnormal levels of other serum enzymes: Secondary | ICD-10-CM | POA: Insufficient documentation

## 2013-01-31 DIAGNOSIS — R935 Abnormal findings on diagnostic imaging of other abdominal regions, including retroperitoneum: Secondary | ICD-10-CM | POA: Insufficient documentation

## 2013-01-31 DIAGNOSIS — R109 Unspecified abdominal pain: Secondary | ICD-10-CM | POA: Insufficient documentation

## 2013-01-31 NOTE — Progress Notes (Signed)
Quick Note:  Probably fatty liver, no cirrhosis via US findings.  He still needs to get AMA done. ______

## 2013-02-07 ENCOUNTER — Other Ambulatory Visit: Payer: Self-pay | Admitting: Gastroenterology

## 2013-02-07 DIAGNOSIS — R945 Abnormal results of liver function studies: Secondary | ICD-10-CM

## 2013-02-07 NOTE — Progress Notes (Signed)
Quick Note:  Normal AMA. Let's repeat HFP just prior to his appt with RMR, so these can be reviewed at that time.   ______

## 2013-02-11 ENCOUNTER — Other Ambulatory Visit: Payer: Self-pay

## 2013-02-11 DIAGNOSIS — R945 Abnormal results of liver function studies: Secondary | ICD-10-CM

## 2013-03-11 ENCOUNTER — Other Ambulatory Visit: Payer: Self-pay | Admitting: Gastroenterology

## 2013-03-11 LAB — HEPATIC FUNCTION PANEL
ALT: 44 U/L (ref 0–53)
AST: 79 U/L — ABNORMAL HIGH (ref 0–37)
Alkaline Phosphatase: 116 U/L (ref 39–117)
Bilirubin, Direct: 0.1 mg/dL (ref 0.0–0.3)
Indirect Bilirubin: 0.5 mg/dL (ref 0.0–0.9)
Total Protein: 6.9 g/dL (ref 6.0–8.3)

## 2013-03-11 IMAGING — CT CT ABD-PELV W/ CM
2 of 5 series · 17 of 46 positions shown, 19 images · IV contrast (Omnipaque 300)
Comparison: None

CLINICAL DATA: Abdominal pain, rectal bleeding, history clotting
disorder, GERD, hypertension

CT ABDOMEN AND PELVIS WITH CONTRAST
TECHNIQUE: Multidetector CT imaging of the abdomen and pelvis was
performed following the standard protocol during bolus
administration of intravenous contrast. Sagittal and coronal MPR
images reconstructed from axial data set.
Contrast: Dilute oral contrast, 100 ml Omnipaque 300 IV

[Series 2: abd_pel_with 5.0 b40f · axial · 0.72mm/px · z∈[-537,-137]mm · 14 of 90 slices shown, 16 images]
[im 5/90  soft-tissue]
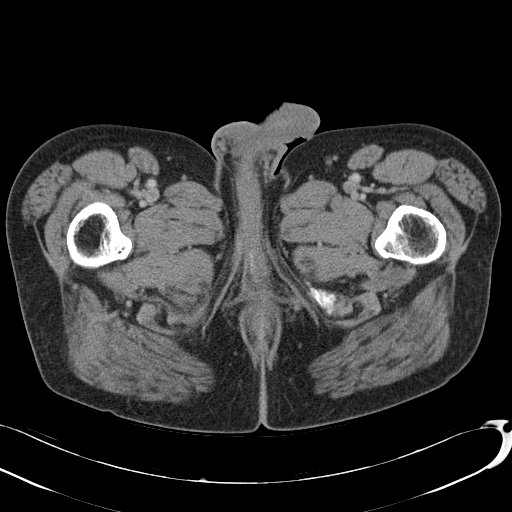
[im 5/90  bone]
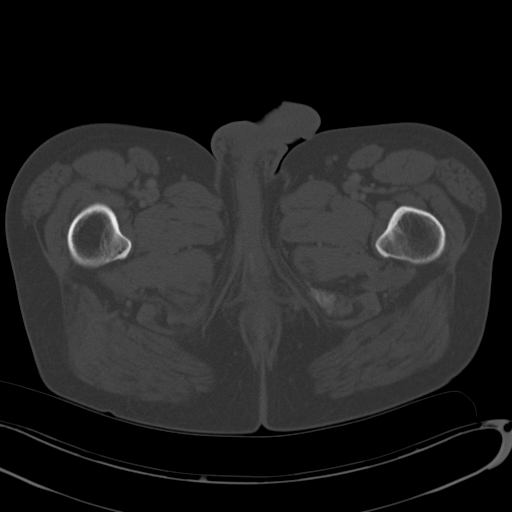
[im 10/90  soft-tissue]
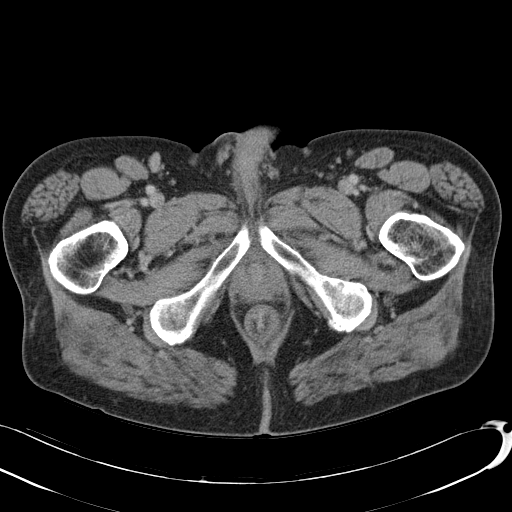
[im 20/90  soft-tissue]
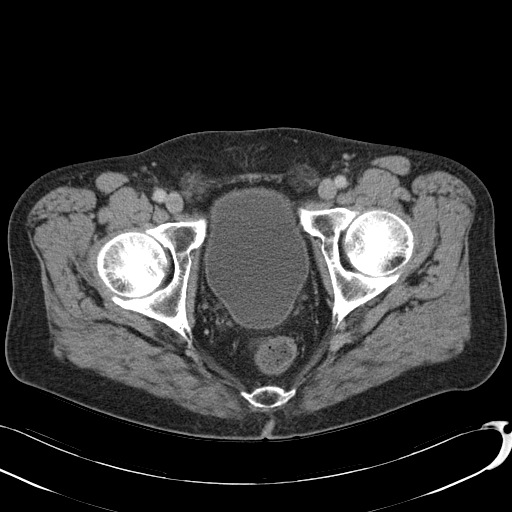
[im 25/90  soft-tissue]
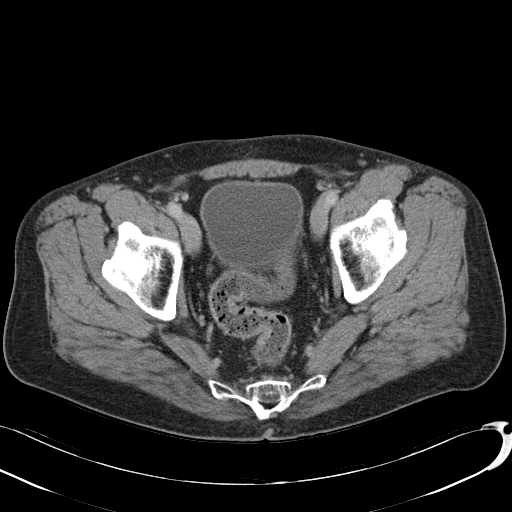
[im 30/90  soft-tissue]
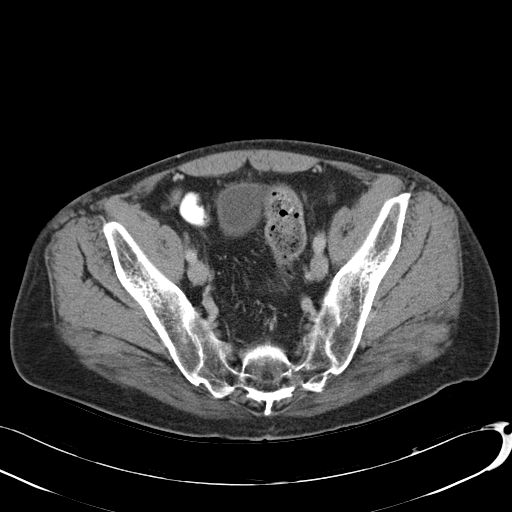
[im 35/90  soft-tissue]
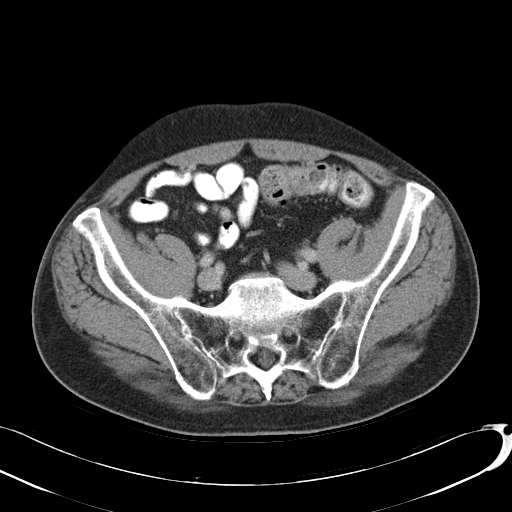
[im 40/90  soft-tissue]
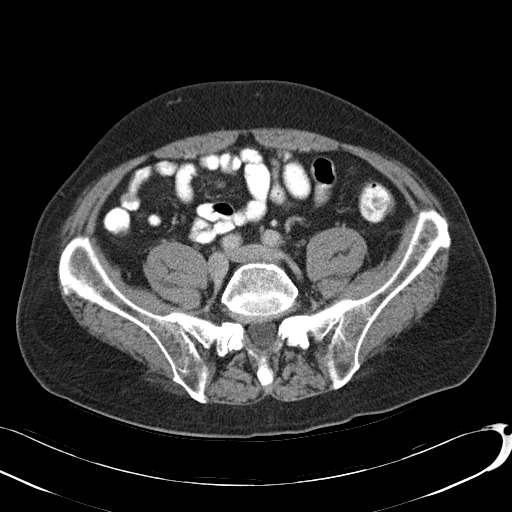
[im 50/90  soft-tissue]
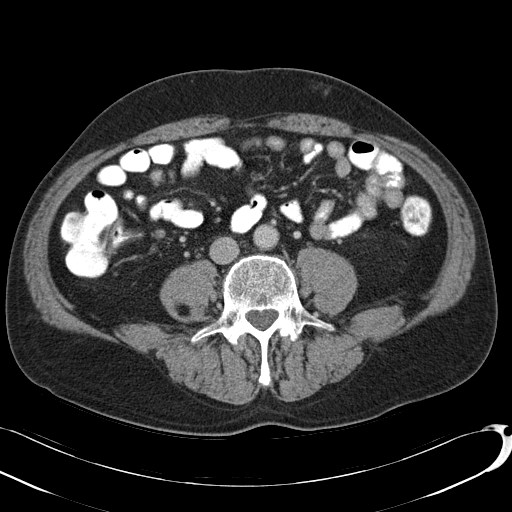
[im 55/90  soft-tissue]
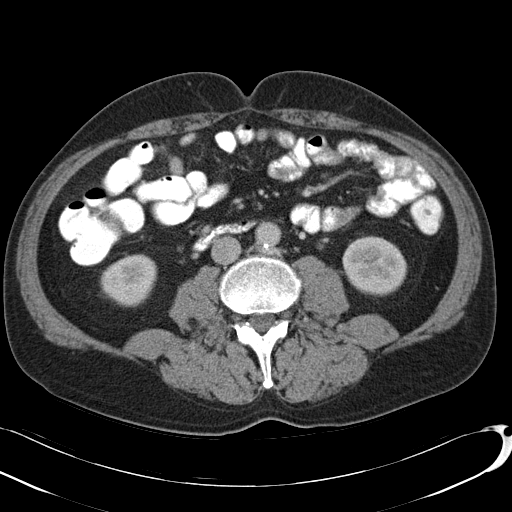
[im 55/90  bone]
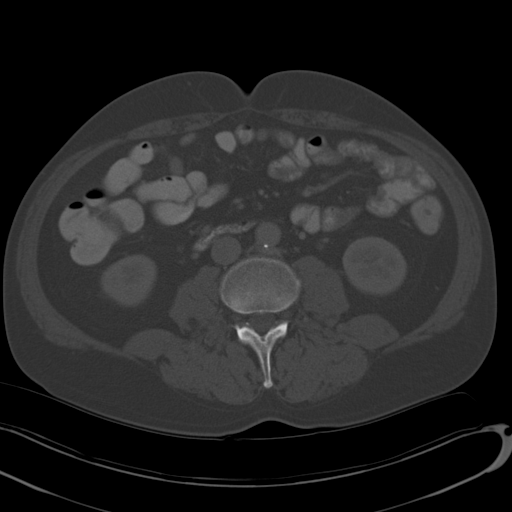
[im 60/90  soft-tissue]
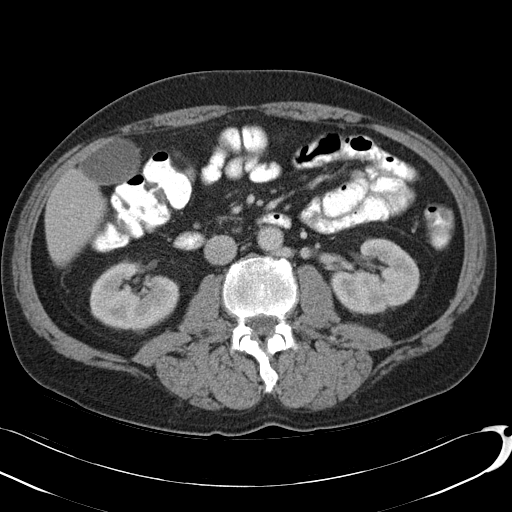
[im 65/90  soft-tissue]
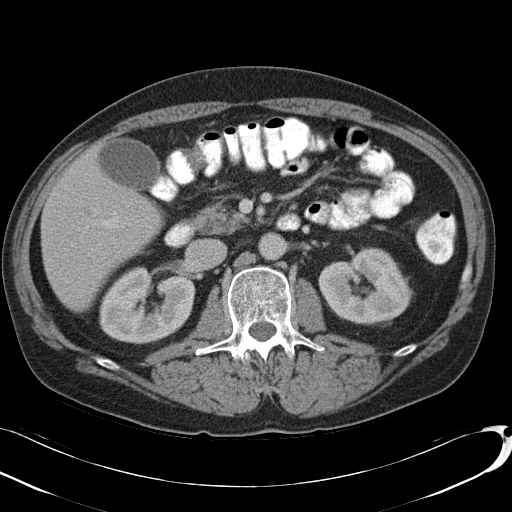
[im 70/90  soft-tissue]
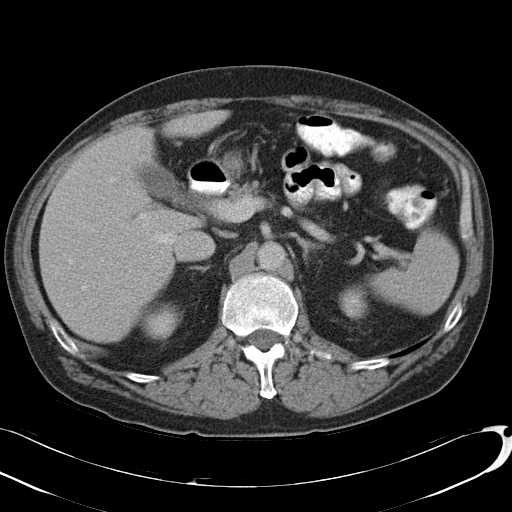
[im 80/90  soft-tissue]
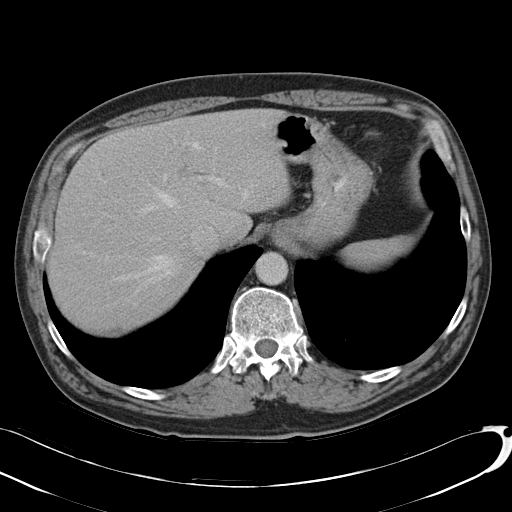
[im 85/90  soft-tissue]
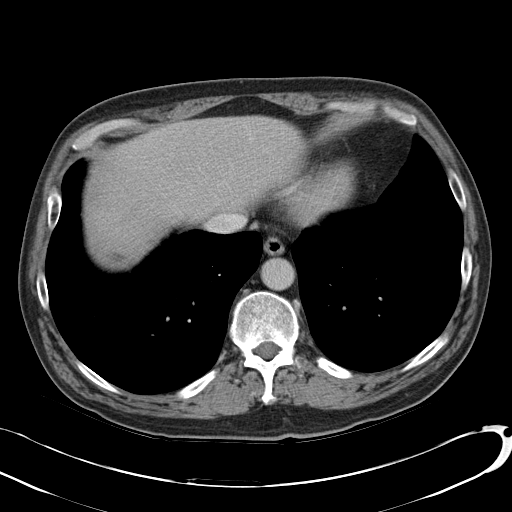

[Series 4: abd_pel_with 3.0 spo cor · coronal · 0.70mm/px · 3 of 89 slices shown]
[im 30/89  soft-tissue]
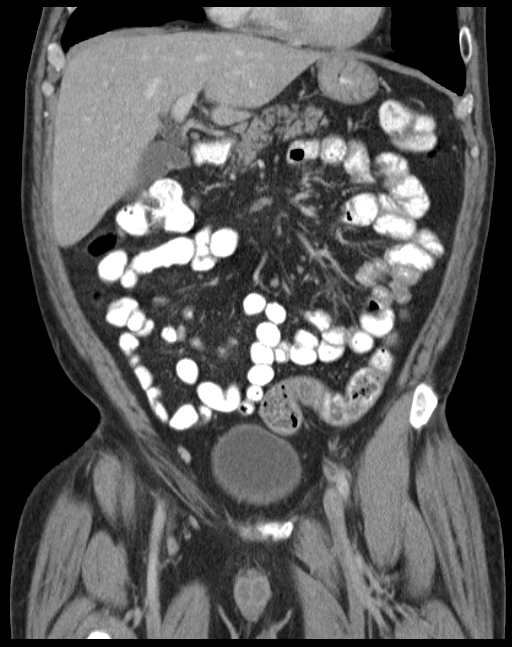
[im 40/89  soft-tissue]
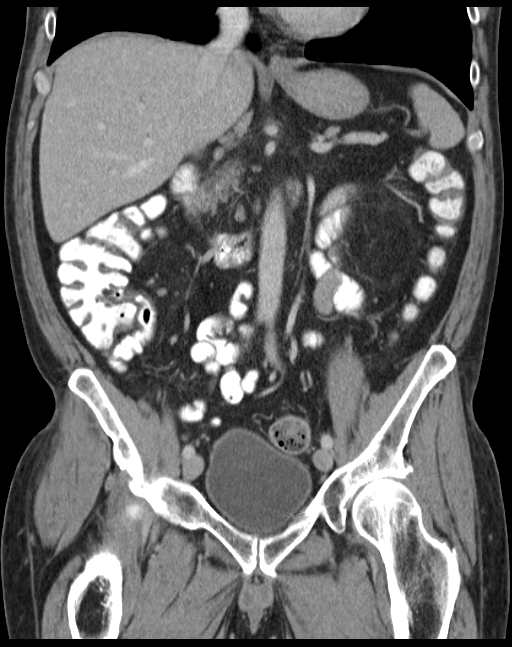
[im 49/89  soft-tissue]
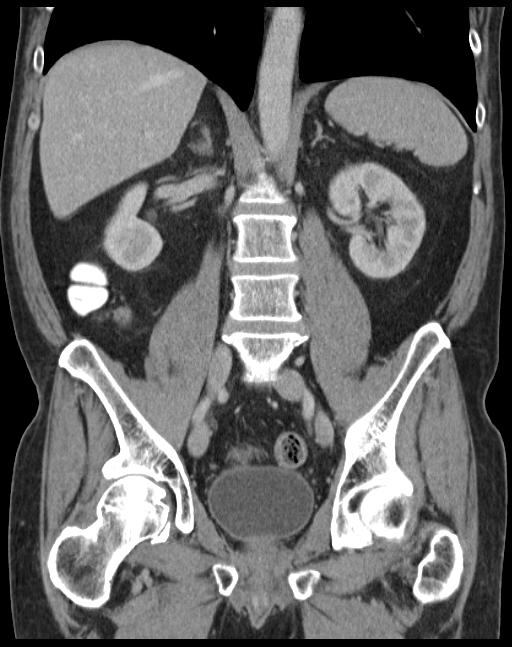

[17 of 46 positions shown; findings below may reference images not displayed]

FINDINGS: Liver, spleen, pancreas, kidneys, and adrenal glands normal
appearance.
Stomach unopacified and underdistended.
Normal appendix.
Large and small bowel loops normal appearance.
Normal appearing ureters and bladder.
No mass, adenopathy, free fluid, or inflammatory process.
Minimal atherosclerotic calcification aorta.
Retroaortic left renal vein.
No acute osseous findings.
Tiny lipoma identified within the right psoas muscle.
IMPRESSION: No acute intra abdominal or intrapelvic abnormality identified.

## 2013-03-18 NOTE — Progress Notes (Signed)
Quick Note:  Alk phos now normalized. AST mildly elevated at 79. Known fatty liver on [place] of abdomen. Negative AMA, which was done due to persistent mild elevation of alk phos. Would recommend: Instructions for fatty liver: Recommend 1-2# weight loss per week until ideal body weight through exercise & diet. Low fat/cholesterol diet.  Avoid sweets, sodas, fruit juices, sweetened beverages like tea, etc. Gradually increase exercise from 15 min daily up to 1 hr per day 5 days/week. Limit alcohol use.  Sees Dr. Jena Gauss on 5/20. Anticipate lifestyle modification, recheck in 6 mos. ______

## 2013-03-19 ENCOUNTER — Ambulatory Visit (INDEPENDENT_AMBULATORY_CARE_PROVIDER_SITE_OTHER): Payer: No Typology Code available for payment source | Admitting: Internal Medicine

## 2013-03-19 ENCOUNTER — Encounter: Payer: Self-pay | Admitting: Internal Medicine

## 2013-03-19 VITALS — BP 119/71 | HR 82 | Temp 98.3°F | Ht 72.0 in | Wt 201.8 lb

## 2013-03-19 DIAGNOSIS — R110 Nausea: Secondary | ICD-10-CM

## 2013-03-19 DIAGNOSIS — K219 Gastro-esophageal reflux disease without esophagitis: Secondary | ICD-10-CM

## 2013-03-19 DIAGNOSIS — R300 Dysuria: Secondary | ICD-10-CM

## 2013-03-19 NOTE — Progress Notes (Signed)
Primary Care Physician:  Lynett Fish, MD Primary Gastroenterologist:  Dr. Jena Gauss  Pre-Procedure History & Physical: HPI:  Alex Hoffman is a 52 y.o. male here for followup of elevated liver enzymes. Mildly elevated alkaline phosphatas,  more recently  - completely normal. AMA negative. Nothing on ultrasound or CT aside from fatty liver. Recently AST was found to be minimally elevated.  Patient's been having very significant suprapubic pain radiating to below both flanks significant difficulty urinating with dysuria and increased urinary frequency. Claims Dr. Cleone Slim this is primary care physician. Cannot get back into see Dr. Wilson Singer because of insurance issues but he tells me he is scheduled to see the urologist in August of this year.  GERD well-controlled on Nexium. Takes Amitiza only occasionally for constipation. Takes Reglan rarely for occasional nausea. No rectal bleeding.  History pancreatic duct stone removal an ERCP at [facility] previously.  Past Medical History  Diagnosis Date  . Pulmonary embolism 12/2010  . Cystic thyroid nodule     left  . HTN (hypertension)   . Heterozygous factor V Leiden mutation 12/2010  . Pancreatitis 05/2011    EUS Dr Christella Hartigan 06/09/11-> dilated main pancreatic duct in HOP, 2-3 filling defects distal pancreatic duct, peripancreatic fluid resolved  . C. difficile colitis 01/2011?  Marland Kitchen Arthritis   . Clotting disorder   . GERD (gastroesophageal reflux disease)   . Chills   . Fever   . Weight loss   . Hearing loss   . Nasal congestion   . Leg swelling   . Palpitations   . Abdominal distension   . Abdominal pain   . Nausea & vomiting   . Rectal pain   . Difficulty urinating   . Arthritis pain   . Headache   . Weakness   . Bruises easily   . Anemia   . Dizziness   . Numbness and tingling     both hands, and both legs and feet  . Joint pain   . Blood in urine     hx of, none current  . Frequent urination at night   . Pneumonia as child  . Abnormal EKG     hx  left bundle branch block on ekg  . Sleep apnea     STOPBANG=5  . Heterozygous factor V Leiden mutation   . Prostatitis, chronic   . Pancreatitis   . Pancreatitis 12/2011  . Arthritis     lower back. Dr. Charlynne Cousins in [place]    Past Surgical History  Procedure Laterality Date  . Neck surgery  1999    C5/C6 fusion, limited neck mobility especially turning to left  . Knee surgery  1980's    arthroscopy, left knee  . Femur fracture surgery  age 75    right leg  . Nerve surgery  1980's    right thumb  . Colonoscopy  01/2011    Dr. Zenon Mayo at [facility]-->normal  . Esophagogastroduodenoscopy  01/2011    Dr. Zenon Mayo at [facility]-->bile reflux in stomach-->patient c/o inadequate conscious sedation (Fentanyl 100mg /Versed 8mg )  . Ercp  07/2011    Dr. Logan Bores [facility]-> pancreatic enterotomy, pancreatic duct stones removed, bile duct not manipulated, no stents  . Cholecystectomy  01/25/2012    Procedure: LAPAROSCOPIC CHOLECYSTECTOMY WITH INTRAOPERATIVE CHOLANGIOGRAM;  Surgeon: Robyne Askew, MD;  Location: [facility];  Service: General;  Laterality: N/A;  . Cystoscopy with hydrodistension and biopsy  01/25/2012    Procedure: CYSTOSCOPY/BIOPSY/HYDRODISTENSION;  Surgeon: Anner Crete, MD;  Location:  [facility];  Service: Urology;  Laterality: N/A;  Cystoscopy and Hydrodistension of the Bladder  . 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 Authorizing Provider  amLODipine (NORVASC) 10 MG tablet Take 10 mg by mouth every morning.    Yes Historical Provider, MD  diazepam (VALIUM) 5 MG tablet Take 5 mg by mouth 2 (two) times daily as needed.    Yes Historical Provider, MD  esomeprazole (NEXIUM) 40 MG capsule Take 40 mg by mouth 2 (two) times daily. For indigestion   Yes Historical Provider, MD  ibuprofen (ADVIL,MOTRIN) 200 MG tablet  Take 200 mg by mouth every 6 (six) hours as needed. Pain   Yes Historical Provider, MD  lubiprostone (AMITIZA) 24 MCG capsule Take 1 capsule (24 mcg total) by mouth 2 (two) times daily with a meal. 12/27/12  Yes Nira Retort, NP  methadone (DOLOPHINE) 10 MG tablet Take 50-70 mg by mouth 3 (three) times daily. Takes 50 mg twice daily and at bedtime takes 70 mg.   Yes Historical Provider, MD  metoCLOPramide (REGLAN) 10 MG tablet Take 10 mg by mouth 4 (four) times daily as needed. For nausea   Yes Historical Provider, MD  oxycodone (ROXICODONE) 30 MG immediate release tablet Take 30 mg by mouth every 6 (six) hours as needed. For pain   Yes Historical Provider, MD  phenazopyridine (PYRIDIUM) 100 MG tablet Take 200 mg by mouth every evening.    Yes Historical Provider, MD  prochlorperazine (COMPAZINE) 10 MG tablet Take 5 mg by mouth 3 (three) times daily.    Yes Historical Provider, MD  silodosin (RAPAFLO) 8 MG CAPS capsule Take 8 mg by mouth as needed.    Yes Historical Provider, MD  warfarin (COUMADIN) 5 MG tablet Take 5 mg by mouth daily.    Yes Historical Provider, MD  zolpidem (AMBIEN) 5 MG tablet Take 5 mg by mouth at bedtime as needed. Sleep   Yes Historical Provider, MD  hydrocortisone (ANUSOL-HC) 25 MG suppository Place 1 suppository (25 mg total) rectally every 12 (twelve) hours. For 7 days 11/14/12   Nira Retort, NP  polyethylene glycol-electrolytes (TRILYTE) 420 G solution Take 4,000 mLs by mouth as directed. 11/14/12   Corbin Ade, MD  tiZANidine (ZANAFLEX) 4 MG tablet Take 4 mg by mouth 2 (two) times daily as needed.     Historical Provider, MD    Allergies as of 03/19/2013  . (No Known Allergies)    Family History  Problem Relation Age of Onset  . GI problems Brother   . Colon cancer Neg Hx   . Liver disease Neg Hx   . GI problems      stomach and lung cancer, aunts/uncles  . Heart attack Brother     before age of 37  . Stroke Brother   . Hypertension Mother   . Hypertension  Father   . Cancer Maternal Aunt     colon/pancreas/lung/stomach  . Heart disease Maternal Uncle   . Cancer Maternal Grandfather     lung    History   Social History  . Marital Status: Married    Spouse Name: N/A    Number of Children: 2  . Years of Education: N/A   Occupational History  .      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 Smoking status: Current Every Day Smoker -- 0.20 packs/day for 35 years    Types: Cigarettes  . Smokeless tobacco: Never Used     Comment: About 2 cigarettes per day.   . Alcohol Use: No     Comment: None since hospitalized with pancreatitis in 05/2011. Prior to that, 1-2 wine coolers few days a week. Denies heavy or daily consumption.  . Drug Use: No  . Sexually Active: Not on file   Other Topics Concern  . Not on file   Social History Narrative  . No narrative on file    Review of Systems: See HPI, otherwise negative ROS  Physical Exam: BP 119/71  Pulse 82  Temp(Src) 98.3 F (36.8 C) (Oral)  Ht 6' (1.829 m)  Wt 201 lb 12.8 oz (91.536 kg)  BMI 27.36 kg/m2 General:   Alert,  somewhat chronically ill individual no acute distress. Skin:  Intact without significant lesions or rashes. Eyes:  Sclera clear, no icterus.   Conjunctiva pink. Ears:  Normal auditory acuity. Nose:  No deformity, discharge,  or lesions. Mouth:  No deformity or lesions. Neck:  Supple; no masses or thyromegaly. No significant cervical adenopathy. Lungs:  Clear throughout to auscultation.   No wheezes, crackles, or rhonchi. No acute distress. Heart:  Regular rate and rhythm; no murmurs, clicks, rubs,  or gallops. Abdomen: Non-distended, normal bowel sounds.  He has quite a bit of suprapubic tenderness and some mild bilateral CVA tenderness. Pulses:  Normal pulses noted. Extremities:  Without clubbing or edema.  Impression/Plan:  52 year old gentleman with a significant urinary tract  symptoms at this time. Reported history of interstitial cystitis. Overdue for a urological procedure as previously recommended. Mildly elevated AST. Reflux symptoms well controlled. Doesn't have any active GI issues at this time. Major issues are likely urological in origin.    Recommendations:  See primary care physician ASAP  Get back to Urologist ASAP  We will repeat a liver profile in 8 weeks  Minimize use of Reglan - discusse side effects  Take Amitiza only as needed  Continue Nexium   Weight loss/exercise as previously recommended for management of fatty liver  Call us to re-schedule appt after you have seen the urologist

## 2013-03-19 NOTE — Patient Instructions (Addendum)
See primary care physician ASAP  Get back to Urologist ASAP  We will repeat a liver profile in 8 weeks  Minimize use of Reglan - discusse side effects  Take Amitiza only as needed  Continue Nexium   Call us to re-schedule appt after you have seen the urologist  Cc PCP

## 2013-03-26 DIAGNOSIS — K859 Acute pancreatitis without necrosis or infection, unspecified: Secondary | ICD-10-CM

## 2013-03-26 DIAGNOSIS — I82409 Acute embolism and thrombosis of unspecified deep veins of unspecified lower extremity: Secondary | ICD-10-CM

## 2013-03-26 DIAGNOSIS — I2699 Other pulmonary embolism without acute cor pulmonale: Secondary | ICD-10-CM

## 2013-03-27 ENCOUNTER — Other Ambulatory Visit: Payer: Self-pay | Admitting: Internal Medicine

## 2013-03-27 DIAGNOSIS — R945 Abnormal results of liver function studies: Secondary | ICD-10-CM

## 2013-04-04 ENCOUNTER — Encounter: Payer: Self-pay | Admitting: Gastroenterology

## 2013-04-23 ENCOUNTER — Other Ambulatory Visit: Payer: Self-pay

## 2013-04-23 DIAGNOSIS — R945 Abnormal results of liver function studies: Secondary | ICD-10-CM

## 2013-04-29 LAB — HEPATIC FUNCTION PANEL
ALT: 19 U/L (ref 0–53)
Bilirubin, Direct: 0.1 mg/dL (ref 0.0–0.3)
Indirect Bilirubin: 0.2 mg/dL (ref 0.0–0.9)
Total Bilirubin: 0.3 mg/dL (ref 0.3–1.2)

## 2013-05-02 ENCOUNTER — Ambulatory Visit (INDEPENDENT_AMBULATORY_CARE_PROVIDER_SITE_OTHER): Payer: Self-pay | Admitting: Gastroenterology

## 2013-05-02 ENCOUNTER — Ambulatory Visit (HOSPITAL_COMMUNITY)
Admission: RE | Admit: 2013-05-02 | Discharge: 2013-05-02 | Disposition: A | Discharge status: Home / Self Care | Payer: Self-pay | Source: Ambulatory Visit | Attending: Gastroenterology | Admitting: Gastroenterology

## 2013-05-02 ENCOUNTER — Encounter: Payer: Self-pay | Admitting: Gastroenterology

## 2013-05-02 VITALS — BP 122/75 | HR 87 | Temp 97.8°F | Ht 68.0 in | Wt 199.4 lb

## 2013-05-02 DIAGNOSIS — R112 Nausea with vomiting, unspecified: Secondary | ICD-10-CM | POA: Insufficient documentation

## 2013-05-02 DIAGNOSIS — K5900 Constipation, unspecified: Secondary | ICD-10-CM

## 2013-05-02 DIAGNOSIS — R1011 Right upper quadrant pain: Secondary | ICD-10-CM

## 2013-05-02 LAB — HEPATIC FUNCTION PANEL
ALT: 30 U/L (ref 0–53)
AST: 37 U/L (ref 0–37)
Albumin: 3.8 g/dL (ref 3.5–5.2)
Alkaline Phosphatase: 152 U/L — ABNORMAL HIGH (ref 39–117)
Total Bilirubin: 0.3 mg/dL (ref 0.3–1.2)

## 2013-05-02 LAB — BASIC METABOLIC PANEL
BUN: 12 mg/dL (ref 6–23)
Chloride: 100 mEq/L (ref 96–112)
Glucose, Bld: 86 mg/dL (ref 70–99)
Potassium: 4.6 mEq/L (ref 3.5–5.3)
Sodium: 136 mEq/L (ref 135–145)

## 2013-05-02 MED ORDER — PANCRELIPASE (LIP-PROT-AMYL) 25000 UNITS PO CPEP: 2.0000 | ORAL_CAPSULE | Freq: Three times a day (TID) | ORAL | Status: DC

## 2013-05-02 MED ORDER — IOHEXOL 300 MG/ML  SOLN
100.0000 mL | Freq: Once | INTRAMUSCULAR | Status: AC | PRN
  Administered 2013-05-02: 100 mL via INTRAVENOUS

## 2013-05-02 NOTE — Progress Notes (Signed)
Referring Provider: Lynett Fish, MD Primary Care Physician:  Lynett Fish, MD Primary GI: Dr. Jena Gauss   Chief Complaint  Patient presents with  . Abdominal Pain    right side    HPI:  Returns today in follow-up with multiple complaints. Notes knot in RUQ, acute onset, severe pain, feels like it is affecting his breathing. Underlying, constant pain but waxes and wanes in intensity. Almost feels like pancreatitis but not as intense. Not same as full-blown attack. Present X 3 weeks. Gallbladder absent. Associated nausea and vomiting. Takes anti-emetics up to 4 times per day. Will cut back on eating when pain is bad. Tries to eat light. Not much of an appetite. Doesn't feel hungry. Decreased appetite. Feels always bloated. Pain wraps around to back. No issues with constipation. Amitiza 24 mcg BID.   Episodic chest pain at rest. Associated with the RUQ pain, feels so heavy. No IVC filter.   Has pressure in lower abdomen, needs surgery with Dr. Annabell Howells.   208 in Jan, now 199.   Quite concerned regarding superficial veins in his legs; reports intermittent swelling, overall diffuse leg discomfort with enlarged veins. Brought pictures.   Past Medical History  Diagnosis Date  . Pulmonary embolism 12/2010  . Cystic thyroid nodule     left  . HTN (hypertension)   . Heterozygous factor V Leiden mutation 12/2010  . Pancreatitis 05/2011    EUS Dr Christella Hartigan 06/09/11-> dilated main pancreatic duct in HOP, 2-3 filling defects distal pancreatic duct, peripancreatic fluid resolved  . C. difficile colitis 01/2011?  Marland Kitchen Arthritis   . Clotting disorder   . GERD (gastroesophageal reflux disease)   . Chills   . Fever   . Weight loss   . Hearing loss   . Nasal congestion   . Leg swelling   . Palpitations   . Abdominal distension   . Abdominal pain   . Nausea & vomiting   . Rectal pain   . Difficulty urinating   . Arthritis pain   . Headache(784.0)   . Weakness   . Bruises easily   . Anemia   .  Dizziness   . Numbness and tingling     both hands, and both legs and feet  . Joint pain   . Blood in urine     hx of, none current  . Frequent urination at night   . Pneumonia as child  . Abnormal EKG     hx left bundle branch block on ekg  . Sleep apnea     STOPBANG=5  . Heterozygous factor V Leiden mutation   . Prostatitis, chronic   . Pancreatitis   . Pancreatitis 12/2011  . Arthritis     lower back. Dr. Charlynne Cousins in [place]    Past Surgical History  Procedure Laterality Date  . Neck surgery  1999    C5/C6 fusion, limited neck mobility especially turning to left  . Knee surgery  1980's    arthroscopy, left knee  . Femur fracture surgery  age 12    right leg  . Nerve surgery  1980's    right thumb  . Colonoscopy  01/2011    Dr. Zenon Mayo at [facility]-->normal  . Esophagogastroduodenoscopy  01/2011    Dr. Zenon Mayo at [facility]-->bile reflux in stomach-->patient c/o inadequate conscious sedation (Fentanyl 100mg /Versed 8mg )  . Ercp  07/2011    Dr. Logan Bores [facility]-> pancreatic enterotomy, pancreatic duct stones removed, bile duct not manipulated, no stents  . Cholecystectomy  01/25/2012  Procedure: LAPAROSCOPIC CHOLECYSTECTOMY WITH INTRAOPERATIVE CHOLANGIOGRAM;  Surgeon: Robyne Askew, MD;  Location: [facility];  Service: General;  Laterality: N/A;  . Cystoscopy with hydrodistension and biopsy  01/25/2012    Procedure: CYSTOSCOPY/BIOPSY/HYDRODISTENSION;  Surgeon: Anner Crete, MD;  Location: [facility];  Service: Urology;  Laterality: N/A;  Cystoscopy and Hydrodistension of the Bladder  . 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 amLODipine (NORVASC) 10 MG tablet Take 10 mg by mouth every morning.       . diazepam (VALIUM) 5 MG tablet Take 5 mg by mouth 2 (two) times daily as needed.       Marland Kitchen esomeprazole (NEXIUM) 40  MG capsule Take 40 mg by mouth 2 (two) times daily. For indigestion      . ibuprofen (ADVIL,MOTRIN) 200 MG tablet Take 200 mg by mouth every 6 (six) hours as needed. Pain      . lubiprostone (AMITIZA) 24 MCG capsule Take 1 capsule (24 mcg total) by mouth 2 (two) times daily with a meal.  60 capsule  3  . methadone (DOLOPHINE) 10 MG tablet Take 50-70 mg by mouth 3 (three) times daily. Takes 50 mg twice daily and at bedtime takes 70 mg.      . oxycodone (ROXICODONE) 30 MG immediate release tablet Take 30 mg by mouth every 6 (six) hours as needed. For pain      . phenazopyridine (PYRIDIUM) 100 MG tablet Take 200 mg by mouth every evening.       . prochlorperazine (COMPAZINE) 10 MG tablet Take 5 mg by mouth 3 (three) times daily.       . silodosin (RAPAFLO) 8 MG CAPS capsule Take 8 mg by mouth as needed.       . warfarin (COUMADIN) 5 MG tablet Take 5 mg by mouth daily.       Marland Kitchen zolpidem (AMBIEN) 5 MG tablet Take 5 mg by mouth at bedtime as needed. Sleep      . hydrocortisone (ANUSOL-HC) 25 MG suppository Place 1 suppository (25 mg total) rectally every 12 (twelve) hours. For 7 days  14 suppository  0  . metoCLOPramide (REGLAN) 10 MG tablet Take 10 mg by mouth 4 (four) times daily as needed. For nausea      . polyethylene glycol-electrolytes (TRILYTE) 420 G solution Take 4,000 mLs by mouth as directed.  4000 mL  0  . tiZANidine (ZANAFLEX) 4 MG tablet Take 4 mg by mouth 2 (two) times daily as needed.        No current facility-administered medications for this visit.    Allergies as of 05/02/2013  . (No Known Allergies)    Family History  Problem Relation Age of Onset  . GI problems Brother   . Colon cancer Neg Hx   . Liver disease Neg Hx   . GI problems      stomach and lung cancer, aunts/uncles  . Heart attack Brother     before age of 57  . Stroke Brother   . Hypertension Mother   . Hypertension Father   . Cancer Maternal Aunt     colon/pancreas/lung/stomach  . Heart disease Maternal  Uncle   . Cancer Maternal Grandfather     lung    History   Social History  . Marital Status: Married    Spouse Name:  N/A    Number of Children: 2  . Years of Education: N/A   Occupational History  .      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 Smoking status: Current Every Day Smoker -- 0.20 packs/day for 35 years    Types: Cigarettes  . Smokeless tobacco: Never Used     Comment: About 2 cigarettes per day.   . Alcohol Use: No     Comment: None since hospitalized with pancreatitis in 05/2011. Prior to that, 1-2 wine coolers few days a week. Denies heavy or daily consumption.  . Drug Use: No  . Sexually Active: None   Other Topics Concern  . None   Social History Narrative  . None    Review of Systems: Negative unless mentioned in HPI  Physical Exam: BP 122/75  Pulse 87  Temp(Src) 97.8 F (36.6 C) (Oral)  Ht 5\' 8"  (1.727 m)  Wt 199 lb 6.4 oz (90.447 kg)  BMI 30.33 kg/m2 General:   Alert and oriented. No distress noted. Pleasant and cooperative.  Head:  Normocephalic and atraumatic. Eyes:  Conjuctiva clear without scleral icterus. Heart:  S1, S2 present without murmurs, rubs, or gallops. Regular rate and rhythm. Abdomen:  +BS, soft, TTP RUQ with only light touch and non-distended. No rebound or guarding. No HSM or masses noted. Msk:  Symmetrical without gross deformities. Normal posture. Extremities:  Without edema. Neurologic:  Alert and  oriented x4;  grossly normal neurologically. Skin:  Intact without significant lesions or rashes. Psych:  Alert and cooperative. Normal mood and affect.

## 2013-05-02 NOTE — Patient Instructions (Addendum)
We have referred you to a Vein and Vascular specialist.   Please have blood work done today. I have also ordered a CT scan.   Start taking Zenpep 2 capsules with meals and 1 with snacks. I have sent this to your pharmacy and provided a sample card and patient support card. This will be helpful or you to save money on prescriptions.

## 2013-05-05 DIAGNOSIS — R1011 Right upper quadrant pain: Secondary | ICD-10-CM | POA: Insufficient documentation

## 2013-05-05 NOTE — Assessment & Plan Note (Signed)
Continue Amitiza BID.  

## 2013-05-05 NOTE — Assessment & Plan Note (Signed)
Several week duration of intermittent RUQ pain noted with associated nausea and vomiting. Weight loss of 9 lbs since Jan. Last EGD in April 2012. Also notable history of chronic pancreatitis. Significantly TTP on exam today. Will order Lipase, HFP, and CT. Question underlying chronic abdominal pain. Consider repeat EGD if necessary. Add Zenpep due to history of pancreatitis.

## 2013-05-06 ENCOUNTER — Other Ambulatory Visit: Payer: Self-pay | Admitting: Gastroenterology

## 2013-05-06 DIAGNOSIS — I83893 Varicose veins of bilateral lower extremities with other complications: Secondary | ICD-10-CM

## 2013-05-06 NOTE — Progress Notes (Signed)
Cc PCP 

## 2013-05-08 ENCOUNTER — Other Ambulatory Visit: Payer: Self-pay | Admitting: *Deleted

## 2013-05-08 DIAGNOSIS — M79609 Pain in unspecified limb: Secondary | ICD-10-CM

## 2013-05-08 DIAGNOSIS — M7989 Other specified soft tissue disorders: Secondary | ICD-10-CM

## 2013-05-08 NOTE — Progress Notes (Signed)
Quick Note:  CBD 11mm, which is unchanged from CT last year.  Discussed findings with patient. RUQ pain has improved since starting pancreatic enzymes. He is aware of results. Please have patient return in 3 months. ______

## 2013-05-13 NOTE — Progress Notes (Signed)
Pt reminder made for patient to have 3 month return ov in October 2014

## 2013-05-31 ENCOUNTER — Encounter: Payer: Self-pay | Admitting: Gastroenterology

## 2013-06-20 ENCOUNTER — Encounter (HOSPITAL_COMMUNITY): Payer: Medicare Other | Attending: Hematology and Oncology

## 2013-06-20 ENCOUNTER — Ambulatory Visit (HOSPITAL_COMMUNITY): Payer: Self-pay

## 2013-06-20 ENCOUNTER — Encounter (HOSPITAL_COMMUNITY): Payer: Self-pay

## 2013-06-20 VITALS — BP 136/88 | HR 104 | Temp 97.7°F | Resp 20 | Wt 196.8 lb

## 2013-06-20 DIAGNOSIS — Z86718 Personal history of other venous thrombosis and embolism: Secondary | ICD-10-CM

## 2013-06-20 DIAGNOSIS — R109 Unspecified abdominal pain: Secondary | ICD-10-CM

## 2013-06-20 DIAGNOSIS — G8929 Other chronic pain: Secondary | ICD-10-CM

## 2013-06-20 MED ORDER — METHADONE HCL 10 MG PO TABS: 50.0000 mg | ORAL_TABLET | Freq: Three times a day (TID) | ORAL | Status: DC

## 2013-06-20 MED ORDER — OXYCODONE HCL 30 MG PO TABS: 30.0000 mg | ORAL_TABLET | Freq: Four times a day (QID) | ORAL | Status: DC | PRN

## 2013-06-20 NOTE — Patient Instructions (Addendum)
[facility] Discharge Instructions  RECOMMENDATIONS MADE BY THE CONSULTANT AND ANY TEST RESULTS WILL BE SENT TO YOUR REFERRING PHYSICIAN.  EXAM FINDINGS BY THE PHYSICIAN TODAY AND SIGNS OR SYMPTOMS TO REPORT TO CLINIC OR PRIMARY PHYSICIAN:   Return in 3 months for follow up.  Get a primary care physician/family doctor.    Your past condition according to the limited records that we received from [facility] does not warrant you being on life long Coumadin. Dr. Beckie Busing will obtain/review your entire record from [facility] before making his final decision. I will be calling you today or tomorrow regarding his medical decision regarding your Coumadin.   For the current time, continue taking your Coumadin.   Dr. Beckie Busing changed your Methadone so that you are taking a fixed dose around the clock. We want to achieve a steady amount of the drug in your system all the time.   Both the oxycodone and methadone have been refilled.   Thank you for choosing [facility] to provide your oncology and hematology care.  To afford each patient quality time with our providers, please arrive at least 15 minutes before your scheduled appointment time.  With your help, our goal is to use those 15 minutes to complete the necessary work-up to ensure our physicians have the information they need to help with your evaluation and healthcare recommendations.    Effective January 1st, 2014, we ask that you re-schedule your appointment with our physicians should you arrive 10 or more minutes late for your appointment.  We strive to give you quality time with our providers, and arriving late affects you and other patients whose appointments are after yours.    Again, thank you for choosing [facility].  Our hope is that these requests will decrease the amount of time that you wait before being seen by our physicians.        _____________________________________________________________  Should you have questions after your visit to [facility], please contact our office at [phone] between the hours of 8:30 a.m. and 5:00 p.m.  Voicemails left after 4:30 p.m. will not be returned until the following business day.  For prescription refill requests, have your pharmacy contact our office with your prescription refill request.

## 2013-06-20 NOTE — Progress Notes (Signed)
Patient History and Physical   Alex Hoffman [id number] [birth year] 52 y.o. 06/20/2013  Referring MD: Self referral.  Chief Complaint:  1.History of  PE 2.Chronic pain   HPI:  Multiple medical problems as listed below.  I have interviewed patient reviewed the limited records from [facility].  Patient was diagnosed with mild bilateral PE in 12/2010 and was noted to have heterozygosity for factor V Leiden mutation. He tells me that the PE was spontaneous without any obvious precipitating factor.  He states that since then he's been on Coumadin and this is confirmed by his records and that he was told you will be on Coumadin for life. I do not have the lab reports to confirm factor V Leiden problem.   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 I also did see a CT angiogram report dated 3/29/201 which showed bilateral pulmonary emboli with relatively mild clots burden with small filling defects in the pulmonary activities bilaterally.  Pulmonary arteries were not enlarged.  According to his records patient has history of pancreatitis with chronic abdominal pain.  In addition mention was made of an chronic cystitis/ and prostatitis.  He is on chronic opioid regimen of the third of which she takes 50-70 mg every 3 times and 30 mg of oxycodone every 6 hours as needed for breakthrough.  He was being cared for by Dr. Cleone Slim prior to his departure. He needs to renew his opioid medications.  He tell me that he  has an up coming appointment with pain clinic in [place] on 07/22/13. He is on 5mg  coumadin/day and last INR was 1 month ago.  PMH: Past Medical History  Diagnosis Date  . Pulmonary embolism 12/2010  . Cystic thyroid nodule     left  . HTN (hypertension)   . Heterozygous factor V Leiden mutation 12/2010  . Pancreatitis 05/2011    EUS Dr Christella Hartigan 06/09/11->  dilated main pancreatic duct in HOP, 2-3 filling defects distal pancreatic duct, peripancreatic fluid resolved  . C. difficile colitis 01/2011?  Marland Kitchen Arthritis   . Clotting disorder   . GERD (gastroesophageal reflux disease)   . Chills   . Fever   . Weight loss   . Hearing loss   . Nasal congestion   . Leg swelling   . Palpitations   . Abdominal distension   . Abdominal pain   . Nausea & vomiting   . Rectal pain   . Difficulty urinating   . Arthritis pain   . Headache(784.0)   . Weakness   . Bruises easily   . Anemia   . Dizziness   . Numbness and tingling     both hands, and both legs and feet  . Joint pain   . Blood in urine     hx of, none current  . Frequent urination at night   . Pneumonia as child  . Abnormal EKG     hx left bundle branch block on ekg  . Sleep apnea     STOPBANG=5  . Heterozygous factor V Leiden mutation   . Prostatitis, chronic   . Pancreatitis   . Pancreatitis 12/2011  . Arthritis     lower back. Dr. Charlynne Cousins in [place]    Past Surgical History  Procedure Laterality Date  . Neck surgery  1999    C5/C6 fusion, limited neck  mobility especially turning to left  . Knee surgery  1980's    arthroscopy, left knee  . Femur fracture surgery  age 32    right leg  . Nerve surgery  1980's    right thumb  . Colonoscopy  01/2011    Dr. Zenon Mayo at [facility]-->normal  . Esophagogastroduodenoscopy  01/2011    Dr. Zenon Mayo at [facility]-->bile reflux in stomach-->patient c/o inadequate conscious sedation (Fentanyl 100mg /Versed 8mg )  . Ercp  07/2011    Dr. Logan Bores [facility]-> pancreatic enterotomy, pancreatic duct stones removed, bile duct not manipulated, no stents  . Cholecystectomy  01/25/2012    Procedure: LAPAROSCOPIC CHOLECYSTECTOMY WITH INTRAOPERATIVE CHOLANGIOGRAM;  Surgeon: Robyne Askew, MD;  Location: [facility];  Service: General;  Laterality: N/A;  . Cystoscopy with hydrodistension and biopsy  01/25/2012    Procedure: CYSTOSCOPY/BIOPSY/HYDRODISTENSION;   Surgeon: Anner Crete, MD;  Location: [facility];  Service: Urology;  Laterality: N/A;  Cystoscopy and Hydrodistension of the Bladder  . 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 , Disp: , Rfl: ;  esomeprazole (NEXIUM) 40 MG capsule, Take 40 mg by mouth 2 (two) times daily. For indigestion, Disp: , Rfl: ;  ibuprofen (ADVIL,MOTRIN) 200 MG tablet, Take 200 mg by mouth every 6 (six) hours as needed. Pain, Disp: , Rfl:  lubiprostone (AMITIZA) 24 MCG capsule, Take 1 capsule (24 mcg total) by mouth 2 (two) times daily with a meal., Disp: 60 capsule, Rfl: 3;  methadone (DOLOPHINE) 10 MG tablet, Take 50-70 mg by mouth 3 (three) times daily. Takes 50 mg 8am, 2pm, and then 70mg  @ bedtime, Disp: , Rfl: ;  metoCLOPramide (REGLAN) 10 MG tablet, Take 10 mg by mouth 4 (four) times daily as needed., Disp: , Rfl:  oxycodone (ROXICODONE) 30 MG immediate release tablet, Take 30 mg by mouth every 6 (six) hours as needed. For pain, Disp: , Rfl: ;  Pancrelipase, Lip-Prot-Amyl, (ZENPEP) 25000 UNITS CPEP, Take 2 capsules by mouth 3 (three) times daily with meals. 1 with snack, Disp: 240 capsule, Rfl: 11;  phenazopyridine (PYRIDIUM) 100 MG tablet, Take 200 mg by mouth daily. , Disp: , Rfl:  prochlorperazine (COMPAZINE) 10 MG tablet, Take 5 mg by mouth 3 (three) times daily. , Disp: , Rfl: ;  silodosin (RAPAFLO) 8 MG CAPS capsule, Take 8 mg by mouth daily with breakfast. , Disp: , Rfl: ;  tiZANidine (ZANAFLEX) 4 MG capsule, Take 4 mg by mouth 3 (three) times daily as needed for muscle spasms., Disp: , Rfl: ;  warfarin (COUMADIN) 5 MG tablet, Take 5 mg by mouth daily. , Disp: , Rfl:  zolpidem (AMBIEN) 5 MG tablet, Take 5 mg by mouth at bedtime as needed. Sleep, Disp: , Rfl: ;  diazepam  (VALIUM) 5 MG tablet, Take 5 mg by mouth 2 (two) times daily as needed (been out of this for 3 weeks (06/20/13)). , Disp: , Rfl:    Social History:   reports that he quit smoking about 5 months ago. His smoking use included Cigarettes. He has a 35 pack-year smoking history. He has never used smokeless tobacco. He reports that he does not drink alcohol or use illicit drugs.  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   Family History: Family History  Problem Relation  Age of Onset  . GI problems Brother   . Colon cancer Neg Hx   . Liver disease Neg Hx   . GI problems      stomach and lung cancer, aunts/uncles  . Heart attack Brother     before age of 14  . Stroke Brother   . Hypertension Mother   . Hypertension Father   . Cancer Maternal Aunt     colon/pancreas/lung/stomach  . Heart disease Maternal Uncle   . Cancer Maternal Grandfather     lung    Review of Systems: 14 point review of system is as in the history above otherwise negative. He has multiple chronic pain complaints.   Physical Exam: Blood pressure 136/88, pulse 104, temperature 97.7 F (36.5 C), temperature source Oral, resp. rate 20, weight 196 lb 12.8 oz (89.268 kg). GENERAL: No distress, .  SKIN:  No rashes or significant lesions , no ecchymosis or petechia or rash. HEAD: Normocephalic, No masses, lesions, tenderness or abnormalities  EYES: Conjunctiva are pink , non-injected and no jaundice. ENT: External ears normal ,lips, buccal mucosa, and tongue normal and mucous membranes are moist . LYMPH: No palpable lymphadenopathy,  In the neck supraclavicular area or axilla. LUNGS: Clear to auscultation , no crackles or wheezes HEART: regular rate & rhythm, no murmurs, no gallops, S1 normal and S2 normal  ABDOMEN:diffuse nonspecific tenderness especially in the suprapubic area. Organomegaly and no hepatosplenomegaly we are difficult to palpate for  Due to guarding by the patient.  MSK: Adjustment of the position  of the examining table resulted in apparent severe pain for the patient EXTREMITIES: No edema, no skin discoloration or tenderness. NEURO: Alert & oriented , no focal motor deficits.     Lab Results: Lab Results  Component Value Date   WBC 9.9 01/17/2012   HGB 12.7* 11/16/2012   HCT 37.3* 11/16/2012   MCV 86.8 01/17/2012   PLT 341 01/17/2012     Chemistry      Component Value Date/Time   NA 136 05/02/2013 1134   NA 137 11/08/2011 1450   K 4.6 05/02/2013 1134   K 3.9 11/08/2011 1450   CL 100 05/02/2013 1134   CL 103 08/29/2011 1618   CO2 30 05/02/2013 1134   CO2 32 08/29/2011 1618   BUN 12 05/02/2013 1134   BUN 8 11/08/2011 1450   CREATININE 0.78 05/02/2013 1134   CREATININE 0.78 11/16/2012 1010      Component Value Date/Time   CALCIUM 9.4 05/02/2013 1134   CALCIUM 9.3 11/08/2011 1450   ALKPHOS 152* 05/02/2013 1134   ALKPHOS 89 11/08/2011 1450   AST 37 05/02/2013 1134   AST 21 11/08/2011 1450   ALT 30 05/02/2013 1134   BILITOT 0.3 05/02/2013 1134   BILITOT 0.6 08/29/2011 1618         Impression: 1. History of mild PE/heterozygosity for factor V Leiden. From the records I have so far I do not see any indication for lifelong anticoagulation especially in the light mild PE.  Heterozygosity for factor V is not particularly associated with the very high risk of thrombosis to warrant lifelong anticoagulation.  Besides looking at use CT angiogram report that his PE was relatively mild.  In this case the standard of care would be 3- 6 months of anticoagulation.  However we have made effort for more records of the patient's care at [facility] especially laboratory results to see if patient has any abnormality there warrants to longer duration of  anticoagulation otherwise  patient does not need to continue Coumadin.  Once we get his records and reviewed it will make definitive recommendation on Coumadin and call him with this.  2. Chronic abdominal pain. Etiology of his pain issues very unclear.  I  think that the idea of seeing  the pain clinic is a very reasonable one.  I do not necessarily agree with the way he's been taking methadone which is 50-70 mg every 8 hours.  Given that this is a longer acting opiate and a fixed dose is appropriate especially considering pharmacokinetic properties of Methadone.  3. Chronic multiple pain issues.  Recommendations: 1. Will renew Methadone and oxycodone 2. I will recommend d/c warfarin once we obtained complete records from [facility]. 3. Patient to obtain a PCP for routine medical care. 4. Follow up surgery and other health care provider as scheduled.     All questions were satisfactorily answered.  I spent 50% of the time was spent counseling the patient face to face. The total time spent in the appointment was 60 minutes.   Sherral Hammers, MD FACP. Hematology/Oncology.     Marland Kitchen

## 2013-06-21 ENCOUNTER — Telehealth (HOSPITAL_COMMUNITY): Payer: Self-pay | Admitting: *Deleted

## 2013-06-21 NOTE — Telephone Encounter (Signed)
Patient notified via voicemail to stop taking Coumadin per Dr. Lenell Antu orders. I asked for patient to please call me back and verify that he did indeed receive the message.

## 2013-07-02 ENCOUNTER — Other Ambulatory Visit (HOSPITAL_COMMUNITY): Payer: Self-pay | Admitting: Hematology and Oncology

## 2013-07-02 ENCOUNTER — Telehealth (HOSPITAL_COMMUNITY): Payer: Self-pay | Admitting: *Deleted

## 2013-07-02 DIAGNOSIS — G8929 Other chronic pain: Secondary | ICD-10-CM

## 2013-07-02 NOTE — Telephone Encounter (Signed)
Patient called requesting that [facility] call him re: scheduling of pain clinic referral. He wants to change from [place] to [place] or [place].

## 2013-07-08 ENCOUNTER — Telehealth (HOSPITAL_COMMUNITY): Payer: Self-pay | Admitting: Oncology

## 2013-07-08 ENCOUNTER — Encounter (HOSPITAL_COMMUNITY): Payer: Self-pay | Attending: Hematology and Oncology

## 2013-07-08 ENCOUNTER — Encounter (HOSPITAL_COMMUNITY): Payer: Self-pay

## 2013-07-08 VITALS — BP 142/83 | HR 73 | Temp 98.2°F | Resp 16 | Wt 201.3 lb

## 2013-07-08 DIAGNOSIS — Z86718 Personal history of other venous thrombosis and embolism: Secondary | ICD-10-CM

## 2013-07-08 DIAGNOSIS — G8929 Other chronic pain: Secondary | ICD-10-CM

## 2013-07-08 DIAGNOSIS — D6859 Other primary thrombophilia: Secondary | ICD-10-CM

## 2013-07-08 DIAGNOSIS — R109 Unspecified abdominal pain: Secondary | ICD-10-CM

## 2013-07-08 MED ORDER — OXYCODONE HCL 30 MG PO TABS: 30.0000 mg | ORAL_TABLET | Freq: Four times a day (QID) | ORAL | Status: DC | PRN

## 2013-07-08 NOTE — Telephone Encounter (Signed)
Attempted to make referral to [facility] - message left on "[facility]" voicemail for new patient referral requesting call back to Cancer Center.  Referral pending at the moment.

## 2013-07-08 NOTE — Progress Notes (Addendum)
[facility] [phone]   [phone]  OFFICE PROGRESS NOTE  Lynett Fish, MD [address] [place] [address]  1.History of PE  2.Chronic pain  INTERVAL HISTORY:   Alex Hoffman 52 y.o. male returns to the clinic today for unscheduled follow up with complaint of running out of his oxycodone and having severe paina as a result. We had also asked patient to stop coumadin which he says that he has stopped but was told by "the oncologist at [facility] " to continue it for life. Referral has been done to the pain clinic but his  appointment is in the works. Otherwise he denies any new problem. He states that he was on 120 tabs of oxycodone and was instead given 30 tabs at the time I saw him last. He is otherwise taking oxycodone and Methadone as  Instructed.   ALLERGIES:  has No Known Allergies.  MEDICATIONS: Reviewed.  REVIEW OF SYSTEMS: As above otherwise negative.  PHYSICAL EXAMINATION:  Blood pressure 142/83, pulse 73, temperature 98.2 F (36.8 C), temperature source Oral, resp. rate 16, weight 201 lb 4.8 oz (91.309 kg). GENERAL: No acute distress. NEURO: Alert & oriented    ASSESSMENT:  1. History of mild PE/heterozygosity for factor V Leiden 2. Chronic abdominal pain.  3. Chronic multiple pain issues.  No new change as regards these diagnosis at this time.   PLAN:  1.  I ordered a refill for 30 mg (#30 tabs) oxycodone, hopefully this will last him until he establishes care with a pain clinic. 120 tabs was confirmed from his pharmacy. Patient instructed to use Oxycodone as when necessary only.  He is also on methadone. 2. I still do not see any indication for lifelong anticoagulation and I have placed a call to the Oncologist at [facility] to discuss this case with her.  I'm awaiting call back. 3. Return to clinic as scheduled.   All questions were satisfactorily answered. Patient knows to call if  any concern arises.  I spent more  than 50 % counseling the patient face to face. The total time spent in the appointment was 30 minutes.   Sherral Hammers, MD FACP. Hematology/Oncology.    Addendum: I have requested for his records from [facility] extensively on more than 6 occassions. I have also spoken to the staff at [facility] including a [place] physician Dr Tawnya Crook.  According to a consult report dated 02/03/2011, patient had cervical spine surgery 1999  But  no report of a DVT was mentioned at that time.  Also in the same consult report, it was mentioned that patient had the C.diff enterocolitis around the time of the hospitalization .It was also stated that the plan will be to anticoagulate him for a month and then check d-dimer.  Possibility of lifelong anticoagulation was raised by the physician and was evaluating him.  On his admission on 07/12/2011 of,  his  discharge summary which stated that " the patient has history of pulmonary embolism and is undergoing treatment.  Diagnosis was made less than 6 months ago.  He is receiving ongoing treatment.  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 Isabel Caprice.  Bilateral lower extremity Doppler  US Date the 06/06/2012 shows no evidence of DVT in the lower extremity veins. Also a left upper extremity  Doppler dated 03/16/2011 shows no evidence of DVT left upper extremity. Hyper coagulability testing done 02/04/11  was negative for anticardiolipin antibody but  Patient was identified as  (heterozygote) for the Facot V leiden mutation [id number]. His follow up visit note of 12/04/2012 does state history of PE/factor V Leiden deficiency diagnosed 12/2010 and does not mention specifically any other  event.   In the light of the facts above and  that follow up  US negative for evidence of DVT, and that the ideal/exact duration of anticoagulation is  unknown in most cases, ending at > 2 years of anticoagulation for his clot  Which is relatively low risk  is sufficient.  I want to check a d-dimer and this will strengthen the recommendation to stop anticoagulation at this time if it is not elevated.  Patient is  aware that that risk of thrombosis persists.  However at this time I doubt that the benefit of a lifelong anticoagulation outweighs the associated risk.

## 2013-07-08 NOTE — Patient Instructions (Signed)
[facility] Discharge Instructions  RECOMMENDATIONS MADE BY THE CONSULTANT AND ANY TEST RESULTS WILL BE SENT TO YOUR REFERRING PHYSICIAN.  EXAM FINDINGS BY THE PHYSICIAN TODAY AND SIGNS OR SYMPTOMS TO REPORT TO CLINIC OR PRIMARY PHYSICIAN: Exam findings as discussed by Dr. Sharia Reeve.  SPECIAL INSTRUCTIONS/FOLLOW-UP: 1.  You were given a prescription for Oxycodone #30 to help you transition to the pain clinic.  We have contacted [facility] as another resource for you to get in with, and we are awaiting a call back from them. 2.  Please keep your appointment in November as scheduled.  Thank you for choosing [facility] to provide your oncology and hematology care.  To afford each patient quality time with our providers, please arrive at least 15 minutes before your scheduled appointment time.  With your help, our goal is to use those 15 minutes to complete the necessary work-up to ensure our physicians have the information they need to help with your evaluation and healthcare recommendations.    Effective January 1st, 2014, we ask that you re-schedule your appointment with our physicians should you arrive 10 or more minutes late for your appointment.  We strive to give you quality time with our providers, and arriving late affects you and other patients whose appointments are after yours.    Again, thank you for choosing [facility].  Our hope is that these requests will decrease the amount of time that you wait before being seen by our physicians.       _____________________________________________________________  Should you have questions after your visit to [facility], please contact our office at [phone] between the hours of 8:30 a.m. and 5:00 p.m.  Voicemails left after 4:30 p.m. will not be returned until the following business day.  For prescription refill requests, have your pharmacy contact our office with your  prescription refill request.

## 2013-07-11 NOTE — Addendum Note (Signed)
Addended by: Sherral Hammers on: 07/11/2013 12:31 PM   Modules accepted: Orders

## 2013-07-19 ENCOUNTER — Encounter: Payer: Self-pay | Admitting: Vascular Surgery

## 2013-07-19 ENCOUNTER — Encounter (HOSPITAL_COMMUNITY): Payer: Self-pay

## 2013-07-19 ENCOUNTER — Encounter (HOSPITAL_BASED_OUTPATIENT_CLINIC_OR_DEPARTMENT_OTHER): Payer: Self-pay

## 2013-07-19 DIAGNOSIS — R109 Unspecified abdominal pain: Secondary | ICD-10-CM

## 2013-07-19 DIAGNOSIS — G8929 Other chronic pain: Secondary | ICD-10-CM

## 2013-07-19 DIAGNOSIS — D6851 Activated protein C resistance: Secondary | ICD-10-CM

## 2013-07-19 DIAGNOSIS — D6859 Other primary thrombophilia: Secondary | ICD-10-CM

## 2013-07-19 HISTORY — DX: Activated protein C resistance: D68.51

## 2013-07-19 MED ORDER — OXYCODONE HCL 30 MG PO TABS: 30.0000 mg | ORAL_TABLET | ORAL | Status: DC | PRN

## 2013-07-19 MED ORDER — RIVAROXABAN 15 MG PO TABS: 15.0000 mg | ORAL_TABLET | Freq: Two times a day (BID) | ORAL | Status: DC

## 2013-07-19 MED ORDER — METHADONE HCL 10 MG PO TABS: 50.0000 mg | ORAL_TABLET | Freq: Three times a day (TID) | ORAL | Status: DC

## 2013-07-19 NOTE — Progress Notes (Signed)
Call received from patients pharmacy stating he could not get xarelto filled due to cost. He told them he  has prescription coverage pending. I have called [facility] and left message for him to call me.

## 2013-07-19 NOTE — Patient Instructions (Addendum)
.  [facility] Discharge Instructions  RECOMMENDATIONS MADE BY THE CONSULTANT AND ANY TEST RESULTS WILL BE SENT TO YOUR REFERRING PHYSICIAN.  EXAM FINDINGS BY THE PHYSICIAN TODAY AND SIGNS OR SYMPTOMS TO REPORT TO CLINIC OR PRIMARY PHYSICIAN: Exam and findings as discussed by Dr. Zigmund Daniel.  MEDICATIONS PRESCRIBED:  Xarelto 15 mg twice a day for 21 days then Xarelto 20 mg daily  Pain med prescriptions given  INSTRUCTIONS/FOLLOW-UP: As scheduled in 3 weeks  Thank you for choosing [facility] to provide your oncology and hematology care.  To afford each patient quality time with our providers, please arrive at least 15 minutes before your scheduled appointment time.  With your help, our goal is to use those 15 minutes to complete the necessary work-up to ensure our physicians have the information they need to help with your evaluation and healthcare recommendations.    Effective January 1st, 2014, we ask that you re-schedule your appointment with our physicians should you arrive 10 or more minutes late for your appointment.  We strive to give you quality time with our providers, and arriving late affects you and other patients whose appointments are after yours.    Again, thank you for choosing [facility].  Our hope is that these requests will decrease the amount of time that you wait before being seen by our physicians.       _____________________________________________________________  Should you have questions after your visit to [facility], please contact our office at [phone] between the hours of 8:30 a.m. and 5:00 p.m.  Voicemails left after 4:30 p.m. will not be returned until the following business day.  For prescription refill requests, have your pharmacy contact our office with your prescription refill request.

## 2013-07-19 NOTE — Progress Notes (Signed)
[facility] OFFICE PROGRESS NOTE  Lynett Fish, MD [address] [place] [address]  DIAGNOSIS: Chronic abdominal pain - Plan: D-dimer, quantitative, Antithrombin III, Protein C activity, Protein C, total, Protein S activity, Protein S, total, Lupus anticoagulant panel, Beta-2-glycoprotein i abs, IgG/M/A, Homocysteine, serum, Factor 5 leiden, Prothrombin gene mutation, Cardiolipin antibodies, IgG, IgM, IgA, CBC with Differential, Comprehensive metabolic panel  Hypercoagulable state - Plan: D-dimer, quantitative, Antithrombin III, Protein C activity, Protein C, total, Protein S activity, Protein S, total, Lupus anticoagulant panel, Beta-2-glycoprotein i abs, IgG/M/A, Homocysteine, serum, Factor 5 leiden, Prothrombin gene mutation, Cardiolipin antibodies, IgG, IgM, IgA, CBC with Differential, Comprehensive metabolic panel  Chief Complaint  Patient presents with  . Abdominal Pain    CURRENT THERAPY: Methadone 50 mg every 8 hours, oxycodone 30 mg every 4-6 hours as needed.  INTERVAL HISTORY: Alex Hoffman 52 y.o. male returns for followup of chronic abdominal pain with bilateral lower extremity pain. 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 He also suffers with intermittent nausea. His abdominal pain is worse after eating. This is despite using ease omeprazole along with pancreatic enzymes. He has suffered bilateral lower extremity deep venous thromboses in the past. He is currently not on any anticoagulants. Bowel movements are regular with Dulcolax. He denies a cough, wheezing, PND, orthopnea, palpitations, headache, or any worsening lower jimmy swelling recently. Probably a workup will be done today to cement the diagnosis.   MEDICAL HISTORY: Past Medical History  Diagnosis Date  . Pulmonary embolism 12/2010  . Cystic thyroid nodule     left  . HTN (hypertension)   . Heterozygous factor V Leiden mutation 12/2010   . Pancreatitis 05/2011    EUS Dr Christella Hartigan 06/09/11-> dilated main pancreatic duct in HOP, 2-3 filling defects distal pancreatic duct, peripancreatic fluid resolved  . C. difficile colitis 01/2011?  Marland Kitchen Arthritis   . Clotting disorder   . GERD (gastroesophageal reflux disease)   . Chills   . Fever   . Weight loss   . Hearing loss   . Nasal congestion   . Leg swelling   . Palpitations   . Abdominal distension   . Abdominal pain   . Nausea & vomiting   . Rectal pain   . Difficulty urinating   . Arthritis pain   . Headache(784.0)   . Weakness   . Bruises easily   . Anemia   . Dizziness   . Numbness and tingling     both hands, and both legs and feet  . Joint pain   . Blood in urine     hx of, none current  . Frequent urination at night   . Pneumonia as child  . Abnormal EKG     hx left bundle branch block on ekg  . Sleep apnea     STOPBANG=5  . Heterozygous factor V Leiden mutation   . Prostatitis, chronic   . Pancreatitis   . Pancreatitis 12/2011  . Arthritis     lower back. Dr. Charlynne Cousins in [place]    INTERIM HISTORY: has GERD (gastroesophageal reflux disease); Chronic abdominal pain; Dysphagia, unspecified; Rectal bleeding; Constipation; Pancreatitis; Abnormal LFTs; Hematuria, gross; Diarrhea; Hypokalemia; Cholecystitis; RUQ pain; and Hypercoagulable state on his problem list.    ALLERGIES:  has No Known Allergies.  MEDICATIONS: has a current medication list which includes the following prescription(s): amlodipine, esomeprazole, lubiprostone, methadone, metoclopramide, oxycodone, pancrelipase (lip-prot-amyl), prochlorperazine, silodosin, zolpidem, diazepam, ibuprofen, phenazopyridine, tizanidine,  and warfarin.  SURGICAL HISTORY:  Past Surgical History  Procedure Laterality Date  . Neck surgery  1999    C5/C6 fusion, limited neck mobility especially turning to left  . Knee surgery  1980's    arthroscopy, left knee  . Femur fracture surgery  age 69    right leg  . Nerve  surgery  1980's    right thumb  . Colonoscopy  01/2011    Dr. Zenon Mayo at [facility]-->normal  . Esophagogastroduodenoscopy  01/2011    Dr. Zenon Mayo at [facility]-->bile reflux in stomach-->patient c/o inadequate conscious sedation (Fentanyl 100mg /Versed 8mg )  . Ercp  07/2011    Dr. Logan Bores [facility]-> pancreatic enterotomy, pancreatic duct stones removed, bile duct not manipulated, no stents  . Cholecystectomy  01/25/2012    Procedure: LAPAROSCOPIC CHOLECYSTECTOMY WITH INTRAOPERATIVE CHOLANGIOGRAM;  Surgeon: Robyne Askew, MD;  Location: [facility];  Service: General;  Laterality: N/A;  . Cystoscopy with hydrodistension and biopsy  01/25/2012    Procedure: CYSTOSCOPY/BIOPSY/HYDRODISTENSION;  Surgeon: Anner Crete, MD;  Location: [facility];  Service: Urology;  Laterality: N/A;  Cystoscopy and Hydrodistension of the Bladder  . 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  PHYSICAL EXAMINATION: ECOG PERFORMANCE STATUS: 3 - Symptomatic, >50% confined to bed  There were no vitals taken for this visit.  GENERAL:alert, no distress and comfortable SKIN: skin color, texture, turgor are normal, no rashes or significant lesions EYES: normal, Conjunctiva are pink and non-injected, sclera clear OROPHARYNX:no exudate, no erythema and lips, buccal mucosa, and tongue normal  NECK: supple, thyroid normal size, non-tender, without nodularity CHEST: Increased AP diameter. LYMPH:  no palpable lymphadenopathy in the cervical, axillary or inguinal LUNGS: clear to auscultation and percussion with normal breathing effort HEART: regular rate & rhythm and no murmurs and no lower extremity edema ABDOMEN:abdomen soft, non-tender and normal bowel sounds. No rebound tenderness. Positive CVA tenderness mostly toward the midline. Musculoskeletal:no cyanosis of  digits and no clubbing. Diffuse lower any swelling to mild palpation. Her cost is noted that are not reddened.  NEURO: alert & oriented x 3 with fluent speech, no focal motor/sensory deficits   LABORATORY DATA: No visits with results within 30 Day(s) from this visit. Latest known visit with results is:  Office Visit on 05/02/2013  Component Date Value Range Status  . Sodium 05/02/2013 136  135 - 145 mEq/L Final  . Potassium 05/02/2013 4.6  3.5 - 5.3 mEq/L Final  . Chloride 05/02/2013 100  96 - 112 mEq/L Final  . CO2 05/02/2013 30  19 - 32 mEq/L Final  . Glucose, Bld 05/02/2013 86  70 - 99 mg/dL Final  . BUN 09/81/1914 12  6 - 23 mg/dL Final  . Creat 78/29/5621 0.78  0.50 - 1.35 mg/dL Final  . Calcium 30/86/5784 9.4  8.4 - 10.5 mg/dL Final  . Total Bilirubin 05/02/2013 0.3  0.3 - 1.2 mg/dL Final  . Bilirubin, Direct 05/02/2013 0.1  0.0 - 0.3 mg/dL Final  . Indirect Bilirubin 05/02/2013 0.2  0.0 - 0.9 mg/dL Final  . Alkaline Phosphatase 05/02/2013 152* 39 - 117 U/L Final  . AST 05/02/2013 37  0 - 37 U/L Final  . ALT 05/02/2013 30  0 - 53 U/L Final  . Total Protein 05/02/2013 7.3  6.0 - 8.3 g/dL Final  . Albumin 69/62/9528  3.8  3.5 - 5.2 g/dL Final  . Lipase 16/08/9603 12  11 - 59 U/L Final     Urinalysis    Component Value Date/Time   COLORURINE YELLOW 01/11/2012 1537   APPEARANCEUR CLEAR 01/11/2012 1537   LABSPEC 1.010 01/11/2012 1537   PHURINE 6.0 01/11/2012 1537   GLUCOSEU NEGATIVE 01/11/2012 1537   HGBUR NEGATIVE 01/11/2012 1537   BILIRUBINUR NEGATIVE 01/11/2012 1537   KETONESUR NEGATIVE 01/11/2012 1537   PROTEINUR NEGATIVE 01/11/2012 1537   UROBILINOGEN 0.2 01/11/2012 1537   NITRITE NEGATIVE 01/11/2012 1537   LEUKOCYTESUR NEGATIVE 01/11/2012 1537    RADIOGRAPHIC STUDIES: No results found.  ASSESSMENT: #1. 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 #2. Vascular insufficiency resulting in syndrome of chronic pancreatitis. #3. 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   PLAN: #1. Will assume the patient's analgesic strategy. #2. Methadone 15 mg every 8 hours, oxycodone 30 mg every 4-6 hours as needed. Continue Dulcolax. #3. Anticoagulate with the results of 15 mg twice a day for 21 days with repeat visit at that time.   All questions were answered. The patient knows to call the clinic with any problems, questions or concerns. We can certainly see the patient much sooner if necessary.  The patient and plan discussed with Alla German A and he is in agreement with the aforementioned.  I spent 40 minutes counseling the patient face to face. The total time spent in the appointment was 30 minutes.    Maurilio Lovely, MD 07/19/2013 11:00 AM

## 2013-07-22 ENCOUNTER — Encounter (HOSPITAL_BASED_OUTPATIENT_CLINIC_OR_DEPARTMENT_OTHER): Payer: Self-pay

## 2013-07-22 ENCOUNTER — Ambulatory Visit (INDEPENDENT_AMBULATORY_CARE_PROVIDER_SITE_OTHER): Payer: Medicare Other | Admitting: Vascular Surgery

## 2013-07-22 ENCOUNTER — Encounter (INDEPENDENT_AMBULATORY_CARE_PROVIDER_SITE_OTHER): Payer: Medicare Other | Admitting: Vascular Surgery

## 2013-07-22 ENCOUNTER — Encounter: Payer: Self-pay | Admitting: Vascular Surgery

## 2013-07-22 VITALS — BP 130/80 | HR 76 | Resp 16 | Ht 72.0 in | Wt 193.0 lb

## 2013-07-22 DIAGNOSIS — M79609 Pain in unspecified limb: Secondary | ICD-10-CM

## 2013-07-22 DIAGNOSIS — M7989 Other specified soft tissue disorders: Secondary | ICD-10-CM

## 2013-07-22 DIAGNOSIS — R109 Unspecified abdominal pain: Secondary | ICD-10-CM

## 2013-07-22 DIAGNOSIS — D6859 Other primary thrombophilia: Secondary | ICD-10-CM

## 2013-07-22 DIAGNOSIS — Z86718 Personal history of other venous thrombosis and embolism: Secondary | ICD-10-CM

## 2013-07-22 DIAGNOSIS — G8929 Other chronic pain: Secondary | ICD-10-CM

## 2013-07-22 LAB — COMPREHENSIVE METABOLIC PANEL
ALT: 18 U/L (ref 0–53)
AST: 23 U/L (ref 0–37)
Alkaline Phosphatase: 116 U/L (ref 39–117)
BUN: 8 mg/dL (ref 6–23)
Chloride: 100 mEq/L (ref 96–112)
GFR calc Af Amer: >90 mL/min (ref >90)
Glucose, Bld: 95 mg/dL (ref 70–99)
Potassium: 3.7 mEq/L (ref 3.5–5.1)
Sodium: 137 mEq/L (ref 135–145)
Total Bilirubin: 0.4 mg/dL (ref 0.3–1.2)
Total Protein: 7 g/dL (ref 6.0–8.3)

## 2013-07-22 LAB — CBC WITH DIFFERENTIAL/PLATELET
Basophils Relative: 0 % (ref 0–1)
Eosinophils Absolute: 0.2 10*3/uL (ref 0.0–0.7)
Hemoglobin: 12.3 g/dL — ABNORMAL LOW (ref 13.0–17.0)
Lymphs Abs: 3.4 10*3/uL (ref 0.7–4.0)
MCH: 28.7 pg (ref 26.0–34.0)
Monocytes Absolute: 0.4 10*3/uL (ref 0.1–1.0)
Monocytes Relative: 5 % (ref 3–12)
Neutro Abs: 3.5 10*3/uL (ref 1.7–7.7)
Neutrophils Relative %: 47 % (ref 43–77)
RBC: 4.28 MIL/uL (ref 4.22–5.81)

## 2013-07-22 NOTE — Progress Notes (Signed)
Subjective:     Patient ID: Alex Hoffman, male   DOB: [birth year], 52 y.o.   MRN: [id number]  HPI this 52 year old male is evaluated for pain and swelling in both lower extremities. He has a history of DVT in the left leg as well as pulmonary embolus. He is on chronic Coumadin. He has been followed by Dr. Margo Common . He apparently has been diagnosed with a factor V Leiden deformity. He complains of aching throbbing burning discomfort in both legs all day. He has tried wearing long light elastic compression stockings and short-leg stockings neither of which work well. He does have swelling as the day progresses. He has no history of bleeding stasis ulcers or other complications such as superficial thrombophlebitis.  Past Medical History  Diagnosis Date  . Pulmonary embolism 12/2010  . Cystic thyroid nodule     left  . HTN (hypertension)   . Heterozygous factor V Leiden mutation 12/2010  . Pancreatitis 05/2011    EUS Dr Christella Hartigan 06/09/11-> dilated main pancreatic duct in HOP, 2-3 filling defects distal pancreatic duct, peripancreatic fluid resolved  . C. difficile colitis 01/2011?  Marland Kitchen Arthritis   . Clotting disorder   . GERD (gastroesophageal reflux disease)   . Chills   . Fever   . Weight loss   . Hearing loss   . Nasal congestion   . Leg swelling   . Palpitations   . Abdominal distension   . Abdominal pain   . Nausea & vomiting   . Rectal pain   . Difficulty urinating   . Arthritis pain   . Headache(784.0)   . Weakness   . Bruises easily   . Anemia   . Dizziness   . Numbness and tingling     both hands, and both legs and feet  . Joint pain   . Blood in urine     hx of, none current  . Frequent urination at night   . Pneumonia as child  . Abnormal EKG     hx left bundle branch block on ekg  . Sleep apnea     STOPBANG=5  . Heterozygous factor V Leiden mutation   . Prostatitis, chronic   . Pancreatitis   . Pancreatitis 12/2011  . Arthritis     lower back. Dr. Charlynne Cousins in  [place]    History  Substance Use Topics  . Smoking status: Former Smoker -- 1.00 packs/day for 35 years    Types: Cigarettes    Quit date: 12/21/2012  . Smokeless tobacco: Never Used     Comment: About 2 cigarettes per day.   . Alcohol Use: No     Comment: No alcohol since January 27, 2011    Family History  Problem Relation Age of Onset  . GI problems Brother   . Colon cancer Neg Hx   . Liver disease Neg Hx   . GI problems      stomach and lung cancer, aunts/uncles  . Heart attack Brother     before age of 70  . Stroke Brother   . Hypertension Mother   . Hypertension Father   . Cancer Maternal Aunt     colon/pancreas/lung/stomach  . Heart disease Maternal Uncle   . Cancer Maternal Grandfather     lung    No Known Allergies  Current outpatient prescriptions:amLODipine (NORVASC) 10 MG tablet, Take 10 mg by mouth every morning. , Disp: , Rfl: ;  diazepam (VALIUM) 5 MG tablet, Take 5 mg by  mouth 2 (two) times daily as needed (been out of this for 3 weeks (06/20/13)). , Disp: , Rfl: ;  esomeprazole (NEXIUM) 40 MG capsule, Take 40 mg by mouth 2 (two) times daily. For indigestion, Disp: , Rfl:  ibuprofen (ADVIL,MOTRIN) 200 MG tablet, Take 200 mg by mouth every 6 (six) hours as needed. Pain, Disp: , Rfl: ;  lubiprostone (AMITIZA) 24 MCG capsule, Take 1 capsule (24 mcg total) by mouth 2 (two) times daily with a meal., Disp: 60 capsule, Rfl: 3;  methadone (DOLOPHINE) 10 MG tablet, Take 5 tablets (50 mg total) by mouth every 8 (eight) hours., Disp: 450 tablet, Rfl: 0 methadone (DOLOPHINE) 10 MG tablet, Take 5 tablets (50 mg total) by mouth every 8 (eight) hours., Disp: 450 tablet, Rfl: 0;  metoCLOPramide (REGLAN) 10 MG tablet, Take 10 mg by mouth 4 (four) times daily as needed., Disp: , Rfl: ;  oxycodone (ROXICODONE) 30 MG immediate release tablet, Take 1 tablet (30 mg total) by mouth every 6 (six) hours as needed. For pain, Disp: 30 tablet, Rfl: 0 oxyCODONE (ROXICODONE) 30 MG immediate  release tablet, Take 1 tablet (30 mg total) by mouth every 4 (four) hours as needed for pain., Disp: 120 tablet, Rfl: 0;  Pancrelipase, Lip-Prot-Amyl, (ZENPEP) 25000 UNITS CPEP, Take 2 capsules by mouth 3 (three) times daily with meals. 1 with snack, Disp: 240 capsule, Rfl: 11;  phenazopyridine (PYRIDIUM) 100 MG tablet, Take 200 mg by mouth daily. , Disp: , Rfl:  prochlorperazine (COMPAZINE) 10 MG tablet, Take 5 mg by mouth 3 (three) times daily. , Disp: , Rfl: ;  Rivaroxaban (XARELTO) 15 MG TABS tablet, Take 1 tablet (15 mg total) by mouth 2 (two) times daily with a meal., Disp: 42 tablet, Rfl: 0;  silodosin (RAPAFLO) 8 MG CAPS capsule, Take 8 mg by mouth daily with breakfast. , Disp: , Rfl:  tiZANidine (ZANAFLEX) 4 MG capsule, Take 4 mg by mouth 3 (three) times daily as needed for muscle spasms., Disp: , Rfl: ;  warfarin (COUMADIN) 5 MG tablet, Take 5 mg by mouth daily. , Disp: , Rfl: ;  zolpidem (AMBIEN) 5 MG tablet, Take 5 mg by mouth at bedtime as needed. Sleep, Disp: , Rfl:   BP 130/80  Pulse 76  Resp 16  Ht 6' (1.829 m)  Wt 193 lb (87.544 kg)  BMI 26.17 kg/m2  Body mass index is 26.17 kg/(m^2).          Review of Systems complains of leg pain with walking, leg pain lying flat, history of DVT, swelling in legs, weakness in arms and legs, numbness in arms and legs, difficulty with speaking, blurred vision, dizziness, and urinary discomfort, occasional chills. All other systems negative complete review of systems    Objective:   Physical Exam BP 130/80  Pulse 76  Resp 16  Ht 6' (1.829 m)  Wt 193 lb (87.544 kg)  BMI 26.17 kg/m2  Gen.-alert and oriented x3 in no apparent distress HEENT normal for age Lungs no rhonchi or wheezing Cardiovascular regular rhythm no murmurs carotid pulses 3+ palpable no bruits audible Abdomen soft nontender no palpable masses Musculoskeletal free of  major deformities Skin clear -no rashes Neurologic normal Lower extremities 3+ femoral and  dorsalis pedis pulses palpable bilaterally with 1+ edema bilaterally. No varicosities are noted. No hyperpigmentation is noted. No ulceration is noted. He does have some reticular veins in both legs extending down the medial malleolar area and onto the sole of the foot.  Today I  ordered bilateral venous duplex exam which I reviewed and interpreted. There is no reflux in the superficial system either great or small saphenous. There is no DVT. There is some deep reflux in the right leg in the popliteal vein. Otherwise the study is unremarkable.      Assessment:     Bilateral leg discomfort with distal edema-complete etiology unknown History of clotting disorder-factor V Leiden-on chronic Coumadin    Plan:     #1 elevated foot of bed at night #2 were long leg elastic compression stockings which should be applied first thing in a.m. #3 no further recommendations

## 2013-07-22 NOTE — Progress Notes (Signed)
Labs drawn today for dimer,antithrombin III,protein C total and activity,protein S activity and total,lupus,beta 2 glycrotein,homocysteine, factor 5,prothrombin gene mutation,cardiolipin.cbc/diff,cmp

## 2013-07-23 LAB — CARDIOLIPIN ANTIBODIES, IGG, IGM, IGA
Anticardiolipin IgA: 5 APL U/mL — ABNORMAL LOW (ref <22)
Anticardiolipin IgM: 1 MPL U/mL — ABNORMAL LOW (ref <11)

## 2013-07-23 LAB — HOMOCYSTEINE: Homocysteine: 10.2 umol/L (ref 4.0–15.4)

## 2013-07-23 LAB — LUPUS ANTICOAGULANT PANEL
DRVVT: 45.3 secs — ABNORMAL HIGH (ref <42.9)
PTT Lupus Anticoagulant: 31.6 secs (ref 28.0–43.0)

## 2013-07-23 LAB — FACTOR 5 LEIDEN

## 2013-07-23 LAB — BETA-2-GLYCOPROTEIN I ABS, IGG/M/A: Beta-2 Glyco I IgG: 3 G Units (ref <20)

## 2013-07-24 LAB — PROTEIN S, TOTAL: Protein S Ag, Total: 126 % (ref 60–150)

## 2013-07-30 ENCOUNTER — Encounter: Payer: Self-pay | Admitting: Internal Medicine

## 2013-08-06 ENCOUNTER — Other Ambulatory Visit: Payer: Self-pay

## 2013-08-06 ENCOUNTER — Other Ambulatory Visit: Payer: Self-pay | Admitting: Gastroenterology

## 2013-08-06 DIAGNOSIS — R945 Abnormal results of liver function studies: Secondary | ICD-10-CM

## 2013-08-06 DIAGNOSIS — R7989 Other specified abnormal findings of blood chemistry: Secondary | ICD-10-CM | POA: Diagnosis not present

## 2013-08-07 LAB — HEPATIC FUNCTION PANEL
ALT: 17 U/L (ref 0–53)
AST: 22 U/L (ref 0–37)
Albumin: 3.9 g/dL (ref 3.5–5.2)
Alkaline Phosphatase: 107 U/L (ref 39–117)
Indirect Bilirubin: 0.6 mg/dL (ref 0.0–0.9)
Total Bilirubin: 0.7 mg/dL (ref 0.3–1.2)
Total Protein: 6.5 g/dL (ref 6.0–8.3)

## 2013-08-09 ENCOUNTER — Encounter: Payer: Self-pay | Admitting: Internal Medicine

## 2013-08-09 ENCOUNTER — Encounter (HOSPITAL_COMMUNITY): Payer: Medicare Other | Attending: Hematology and Oncology

## 2013-08-09 ENCOUNTER — Encounter (HOSPITAL_COMMUNITY): Payer: Self-pay

## 2013-08-09 ENCOUNTER — Ambulatory Visit (INDEPENDENT_AMBULATORY_CARE_PROVIDER_SITE_OTHER): Payer: Medicare Other | Admitting: Internal Medicine

## 2013-08-09 VITALS — BP 144/78 | HR 78 | Temp 98.0°F | Resp 16 | Wt 200.4 lb

## 2013-08-09 VITALS — BP 126/74 | HR 77 | Temp 98.0°F | Ht 68.0 in | Wt 200.4 lb

## 2013-08-09 DIAGNOSIS — D6859 Other primary thrombophilia: Secondary | ICD-10-CM

## 2013-08-09 DIAGNOSIS — K8689 Other specified diseases of pancreas: Secondary | ICD-10-CM | POA: Diagnosis not present

## 2013-08-09 DIAGNOSIS — K859 Acute pancreatitis without necrosis or infection, unspecified: Secondary | ICD-10-CM

## 2013-08-09 DIAGNOSIS — K219 Gastro-esophageal reflux disease without esophagitis: Secondary | ICD-10-CM | POA: Diagnosis not present

## 2013-08-09 DIAGNOSIS — K5900 Constipation, unspecified: Secondary | ICD-10-CM | POA: Diagnosis not present

## 2013-08-09 DIAGNOSIS — R109 Unspecified abdominal pain: Secondary | ICD-10-CM

## 2013-08-09 MED ORDER — ESOMEPRAZOLE MAGNESIUM 40 MG PO CPDR: 40.0000 mg | DELAYED_RELEASE_CAPSULE | Freq: Two times a day (BID) | ORAL | Status: DC

## 2013-08-09 MED ORDER — DIAZEPAM 5 MG PO TABS: 5.0000 mg | ORAL_TABLET | Freq: Four times a day (QID) | ORAL | Status: DC | PRN

## 2013-08-09 MED ORDER — OXYCODONE HCL 30 MG PO TABS: 30.0000 mg | ORAL_TABLET | ORAL | Status: DC | PRN

## 2013-08-09 MED ORDER — XARELTO 20 MG PO TABS: 20.0000 mg | ORAL_TABLET | Freq: Every day | ORAL | Status: DC

## 2013-08-09 MED ORDER — ZOLPIDEM TARTRATE 5 MG PO TABS: 5.0000 mg | ORAL_TABLET | Freq: Every evening | ORAL | Status: DC | PRN

## 2013-08-09 MED ORDER — METHADONE HCL 5 MG PO TABS
15.0000 mg | ORAL_TABLET | Freq: Three times a day (TID) | ORAL | Status: DC
Start: 2013-08-09 — End: 2013-09-05

## 2013-08-09 MED ORDER — METHADONE HCL 10 MG PO TABS: 50.0000 mg | ORAL_TABLET | Freq: Three times a day (TID) | ORAL | Status: DC

## 2013-08-09 NOTE — Progress Notes (Signed)
Primary Care Physician:  Eula Listen, MD Primary Gastroenterologist:  Dr. Jena Gauss  Pre-Procedure History & Physical: HPI:  Alex Hoffman is a 52 y.o. male here for followup of chronic pancreatitis, GERD and constipation. He is doing very well from GI standpoint; has suprapubic pain in the setting of known urinary bladder stones. He sees the urologist in the near future. We started him on pancreatic enzymes and since last visit. This is giving him more energy. He doesn't have any diarrhea. Reflux symptoms well controlled. Prior CT negative status post cholecystectomy. LFTs normal 2 days ago. States he has some muscle twitches every now and then a low-dose metoclopramide. He's no longer on Coumadin. He takes Biochemist, clinical.  Past Medical History  Diagnosis Date  . Pulmonary embolism 12/2010  . Cystic thyroid nodule     left  . HTN (hypertension)   . Heterozygous factor V Leiden mutation 12/2010  . Pancreatitis 05/2011    EUS Dr Christella Hartigan 06/09/11-> dilated main pancreatic duct in HOP, 2-3 filling defects distal pancreatic duct, peripancreatic fluid resolved  . C. difficile colitis 01/2011?  Marland Kitchen Arthritis   . Clotting disorder   . GERD (gastroesophageal reflux disease)   . Chills   . Fever   . Weight loss   . Hearing loss   . Nasal congestion   . Leg swelling   . Palpitations   . Abdominal distension   . Abdominal pain   . Nausea & vomiting   . Rectal pain   . Difficulty urinating   . Arthritis pain   . Headache(784.0)   . Weakness   . Bruises easily   . Anemia   . Dizziness   . Numbness and tingling     both hands, and both legs and feet  . Joint pain   . Blood in urine     hx of, none current  . Frequent urination at night   . Pneumonia as child  . Abnormal EKG     hx left bundle branch block on ekg  . Sleep apnea     STOPBANG=5  . Heterozygous factor V Leiden mutation   . Prostatitis, chronic   . Pancreatitis   . Pancreatitis 12/2011  . Arthritis     lower back. Dr. Charlynne Cousins in  [place]    Past Surgical History  Procedure Laterality Date  . Neck surgery  1999    C5/C6 fusion, limited neck mobility especially turning to left  . Knee surgery  1980's    arthroscopy, left knee  . Femur fracture surgery  age 69    right leg  . Nerve surgery  1980's    right thumb  . Colonoscopy  01/2011    [facility]. Zenon Mayo at [facility]-->normal  . Esophagogastroduodenoscopy  01/2011    Dr. Zenon Mayo at [facility]-->bile reflux in stomach-->patient c/o inadequate conscious sedation (Fentanyl 100mg /Versed 8mg )  . Ercp  07/2011    Dr. Logan Bores [facility]-> pancreatic enterotomy, pancreatic duct stones removed, bile duct not manipulated, no stents  . Cholecystectomy  01/25/2012    Procedure: LAPAROSCOPIC CHOLECYSTECTOMY WITH INTRAOPERATIVE CHOLANGIOGRAM;  Surgeon: Robyne Askew, MD;  Location: [facility];  Service: General;  Laterality: N/A;  . Cystoscopy with hydrodistension and biopsy  01/25/2012    Procedure: CYSTOSCOPY/BIOPSY/HYDRODISTENSION;  Surgeon: Anner Crete, MD;  Location: [facility];  Service: Urology;  Laterality: N/A;  Cystoscopy and Hydrodistension of the Bladder  . 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 Authorizing Provider  amLODipine (NORVASC) 10 MG tablet Take 10 mg by mouth every morning.    Yes Historical Provider, MD  diazepam (VALIUM) 5 MG tablet Take 5 mg by mouth 2 (two) times daily as needed (been out of this for 3 weeks (06/20/13)).    Yes Historical Provider, MD  esomeprazole (NEXIUM) 40 MG capsule Take 40 mg by mouth 2 (two) times daily. For indigestion   Yes Historical Provider, MD  ibuprofen (ADVIL,MOTRIN) 200 MG tablet Take 200 mg by mouth every 6 (six) hours as needed. Pain   Yes Historical Provider, MD  lubiprostone (AMITIZA) 24 MCG capsule Take 1 capsule (24 mcg total) by mouth 2 (two) times  daily with a meal. 12/27/12  Yes Nira Retort, NP  methadone (DOLOPHINE) 10 MG tablet Take 5 tablets (50 mg total) by mouth every 8 (eight) hours. 06/20/13  Yes Sherral Hammers, MD  methadone (DOLOPHINE) 10 MG tablet Take 5 tablets (50 mg total) by mouth every 8 (eight) hours. 07/19/13  Yes Alla German, MD  metoCLOPramide (REGLAN) 10 MG tablet Take 10 mg by mouth 4 (four) times daily as needed.   Yes Historical Provider, MD  oxycodone (ROXICODONE) 30 MG immediate release tablet Take 1 tablet (30 mg total) by mouth every 6 (six) hours as needed. For pain 07/08/13  Yes Sherral Hammers, MD  oxyCODONE (ROXICODONE) 30 MG immediate release tablet Take 1 tablet (30 mg total) by mouth every 4 (four) hours as needed for pain. 07/19/13  Yes Alla German, MD  Pancrelipase, Lip-Prot-Amyl, (ZENPEP) 25000 UNITS CPEP Take 2 capsules by mouth 3 (three) times daily with meals. 1 with snack 05/02/13  Yes Nira Retort, NP  phenazopyridine (PYRIDIUM) 100 MG tablet Take 200 mg by mouth daily.    Yes Historical Provider, MD  prochlorperazine (COMPAZINE) 10 MG tablet Take 5 mg by mouth 3 (three) times daily.    Yes Historical Provider, MD  Rivaroxaban (XARELTO) 15 MG TABS tablet Take 1 tablet (15 mg total) by mouth 2 (two) times daily with a meal. 07/19/13  Yes Alla German, MD  silodosin (RAPAFLO) 8 MG CAPS capsule Take 8 mg by mouth daily with breakfast.    Yes Historical Provider, MD  tiZANidine (ZANAFLEX) 4 MG capsule Take 4 mg by mouth 3 (three) times daily as needed for muscle spasms.   Yes Historical Provider, MD  warfarin (COUMADIN) 5 MG tablet Take 5 mg by mouth daily.    Yes Historical Provider, MD  zolpidem (AMBIEN) 5 MG tablet Take 5 mg by mouth at bedtime as needed. Sleep   Yes Historical Provider, MD    Allergies as of 08/09/2013  . (No Known Allergies)    Family History  Problem Relation Age of Onset  . GI problems Brother   . Colon cancer Neg Hx   . Liver disease Neg Hx   . GI problems       stomach and lung cancer, aunts/uncles  . Heart attack Brother     before age of 78  . Stroke Brother   . Hypertension Mother   . Hypertension Father   . Cancer Maternal Aunt     colon/pancreas/lung/stomach  . Heart disease Maternal Uncle   . Cancer Maternal Grandfather     lung    History   Social History  . Marital Status: Married  Spouse Name: N/A    Number of Children: 2  . Years of Education: N/A   Occupational History  .      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 Smoking status: Former Smoker -- 1.00 packs/day for 35 years    Types: Cigarettes    Quit date: 12/21/2012  . Smokeless tobacco: Never Used     Comment: About 2 cigarettes per day.   . Alcohol Use: No     Comment: No alcohol since January 27, 2011  . Drug Use: No  . Sexual Activity: Not on file   Other Topics Concern  . Not on file   Social History Narrative  . No narrative on file    Review of Systems: See HPI, otherwise negative ROS  Physical Exam: BP 126/74  Pulse 77  Temp(Src) 98 F (36.7 C) (Oral)  Ht 5\' 8"  (1.727 m)  Wt 200 lb 6.4 oz (90.901 kg)  BMI 30.48 kg/m2 General:   Alert,  Well-developed, well-nourished, pleasant and cooperative in NAD. No obvious target dyskinesia movement Skin:  Intact without significant lesions or rashes. Eyes:  Sclera clear, no icterus.   Conjunctiva pink. Ears:  Normal auditory acuity. Nose:  No deformity, discharge,  or lesions. Mouth:  No deformity or lesions. Neck:  Supple; no masses or thyromegaly. No significant cervical adenopathy. Lungs:  Clear throughout to auscultation.   No wheezes, crackles, or rhonchi. No acute distress. Heart:  Regular rate and rhythm; no murmurs, clicks, rubs,  or gallops. Abdomen: Non-distended, normal bowel sounds.  Soft with some suprapubic tenderness to palpation. Pulses:  Normal pulses noted.   Impression:   Pleasant 52 year old gentleman  with chronic pancreatitis chronic diabetic enzyme supplementation and has suppression therapy. Doing fairly well from that standpoint. GERD symptoms well controlled GERD chronic constipation managed with hematochezia. Questionable muscle twitches on low-dose Reglan. The suprapubic pain likely urological in origin given his prior GI workup   Recommendations:  Stop Reglan and never take it again. Continue twice a day Nexium. Will call in a one year prescription. Continue pancreatic enzyme supplementation. Continue and Amitiza  Strongly urged urological evaluation. Office visit here in 6 months and when necessary.

## 2013-08-09 NOTE — Progress Notes (Signed)
[facility] OFFICE PROGRESS NOTE  Alex Doe, MD No address on file  DIAGNOSIS: Pancreatitis - Plan: Lipase, Lipase  Hypercoagulable state - Plan: D-dimer, quantitative  Chief Complaint  Patient presents with  . Thromophilia  . Abdominal Pain     CURRENT THERAPY: Xarelto, methadone, oxycodone, pancreatic enzymes.  INTERVAL HISTORY: Alex Hoffman 52 y.o. 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 Alex Hoffman's been tolerating Xarelto 50 mg twice a day well with no episodes of epistaxis, melena, hematochezia, hematuria, or hemoptysis. Pain is controlled with current analgesic regimen. Alex Hoffman was getting jittery with metoclopramide and for that reason the drug was stopped by his gastroenterologist. Alex Hoffman denies any chest pain, PND, orthopnea, palpitations, skin rash, but with significant lower extremity pain with swelling and exquisite tenderness particularly around the heels. Alex Hoffman denies any skin breakdown. Alex Hoffman also denies any nausea, vomiting, diarrhea, constipation, headache, or seizures.   MEDICAL HISTORY: Past Medical History  Diagnosis Date  . Pulmonary embolism 12/2010  . Cystic thyroid nodule     left  . HTN (hypertension)   . Heterozygous factor V Leiden mutation 12/2010  . Pancreatitis 05/2011    EUS Alex [facility] 06/09/11-> dilated main pancreatic duct in HOP, 2-3 filling defects distal pancreatic duct, peripancreatic fluid resolved  . C. difficile colitis 01/2011?  Marland Kitchen Arthritis   . Clotting disorder   . GERD (gastroesophageal reflux disease)   . Chills   . Fever   . Weight loss   . Hearing loss   . Nasal congestion   . Leg swelling   . Palpitations   . Abdominal distension   . Abdominal pain   . Nausea & vomiting   . Rectal pain   . Difficulty urinating   . Arthritis pain   . Headache(784.0)   . Weakness   . Bruises easily   . Anemia   . Dizziness   . Numbness and tingling     both hands,  and both legs and feet  . Joint pain   . Blood in urine     hx of, none current  . Frequent urination at night   . Pneumonia as child  . Abnormal EKG     hx left bundle branch block on ekg  . Sleep apnea     STOPBANG=5  . Heterozygous factor V Leiden mutation   . Prostatitis, chronic   . Pancreatitis   . Pancreatitis 12/2011  . Arthritis     lower back. Alex Hoffman in [place]    INTERIM HISTORY: has GERD (gastroesophageal reflux disease); Chronic abdominal pain; Dysphagia, unspecified; Rectal bleeding; Constipation; Pancreatitis; Abnormal LFTs; Hematuria, gross; Diarrhea; Hypokalemia; Cholecystitis; RUQ pain; Hypercoagulable state; and Pain in limb on his problem list.    ALLERGIES:  is allergic to reglan.  MEDICATIONS: has a current medication list which includes the following prescription(s): amlodipine, esomeprazole, ibuprofen, lubiprostone, methadone, oxycodone, pancrelipase (lip-prot-amyl), phenazopyridine, prochlorperazine, rivaroxaban, silodosin, tizanidine, diazepam, methadone, and zolpidem.  SURGICAL HISTORY:  Past Surgical History  Procedure Laterality Date  . Neck surgery  1999    C5/C6 fusion, limited neck mobility especially turning to left  . Knee surgery  1980's    arthroscopy, left knee  . Femur fracture surgery  age 54    right leg  . Nerve surgery  1980's    right thumb  . Colonoscopy  01/2011    [facility] at [facility]-->normal  . Esophagogastroduodenoscopy  01/2011  Alex [facility] at [facility]-->bile reflux in stomach-->patient c/o inadequate conscious sedation (Fentanyl 100mg /Versed 8mg )  . Ercp  07/2011    Alex Hoffman [facility]-> pancreatic enterotomy, pancreatic duct stones removed, bile duct not manipulated, no stents  . Cholecystectomy  01/25/2012    Procedure: LAPAROSCOPIC CHOLECYSTECTOMY WITH INTRAOPERATIVE CHOLANGIOGRAM;  Surgeon: Alex Askew, MD;  Location: [facility];  Service: General;  Laterality: N/A;  . Cystoscopy with hydrodistension and biopsy   01/25/2012    Procedure: CYSTOSCOPY/BIOPSY/HYDRODISTENSION;  Surgeon: Alex Crete, MD;  Location: [facility];  Service: Urology;  Laterality: N/A;  Cystoscopy and Hydrodistension of the Bladder  . 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 There is no history of Colon cancer or Liver disease.  SOCIAL HISTORY:  reports that Alex Hoffman quit smoking about 7 months ago. His smoking use included Cigarettes. Alex Hoffman has a 35 pack-year smoking history. Alex Hoffman has never used smokeless tobacco. Alex Hoffman reports that Alex Hoffman does not drink alcohol or use illicit drugs.  REVIEW OF SYSTEMS:  Other than that discussed above is noncontributory.  PHYSICAL EXAMINATION: ECOG PERFORMANCE STATUS: 1 - Symptomatic but completely ambulatory  Blood pressure 144/78, pulse 78, temperature 98 F (36.7 C), resp. rate 16, weight 200 lb 6.4 oz (90.901 kg).  GENERAL:alert, no distress and comfortable SKIN: skin color, texture, turgor are normal, no rashes or significant lesions EYES: PERLA; Conjunctiva are pink and non-injected, sclera clear OROPHARYNX:no exudate, no erythema on lips, buccal mucosa, or tongue. NECK: supple, thyroid normal size, non-tender, without nodularity. No masses CHEST: Increased AP diameter with no breast masses. No gynecomastia. LYMPH:  no palpable lymphadenopathy in the cervical, axillary or inguinal LUNGS: clear to auscultation and percussion with normal breathing effort HEART: regular rate & rhythm and no murmurs and no lower extremity edema ABDOMEN:abdomen soft, non-tender and normal bowel sounds MUSCULOSKELETAL:no cyanosis of digits and no  clubbing. Range of motion normal. Mottledl lower extremity appearance with superficial varicosities and exquisite tenderness to palpation. NEURO: alert & oriented x 3 with fluent speech, no focal motor/sensory deficits   LABORATORY DATA: Lab Results  Component Value Date   WBC 7.5 07/22/2013   HGB 12.3* 07/22/2013   HCT 36.3* 07/22/2013   MCV 84.8 07/22/2013   PLT 296 07/22/2013      PATHOLOGY: None  Urinalysis    Component Value Date/Time   COLORURINE YELLOW 01/11/2012 1537   APPEARANCEUR CLEAR 01/11/2012 1537   LABSPEC 1.010 01/11/2012 1537   PHURINE 6.0 01/11/2012 1537   GLUCOSEU NEGATIVE 01/11/2012 1537   HGBUR NEGATIVE 01/11/2012 1537   BILIRUBINUR NEGATIVE 01/11/2012 1537   KETONESUR NEGATIVE 01/11/2012 1537   PROTEINUR NEGATIVE 01/11/2012 1537   UROBILINOGEN 0.2 01/11/2012 1537   NITRITE NEGATIVE 01/11/2012 1537   LEUKOCYTESUR NEGATIVE 01/11/2012 1537    RADIOGRAPHIC STUDIES: No results found.  ASSESSMENT: #1. Abdominal pain from previous pancreatitis, controlled. #2. Thrombophilia.   PLAN: #1 Xarelto 20 mg daily after completing 21 days of 15 mg twice a day. #2. Methadone 50 mg every 8 hours along with oxycodone 30 mg of therapy 4 hours as needed for breakthrough pain. #3. Valium 5 mg as needed, Ambien 5 mg  at bedtime. #4Vickii Chafe prescription through [facility] filled.   All questions were answered. The patient knows to call the clinic with any problems, questions or concerns. We can certainly see the patient much sooner if necessary.   I spent 30 minutes counseling the patient face to face. The total time spent in the appointment was 25 minutes.    Maurilio Lovely, MD 08/09/2013 11:22 AM

## 2013-08-09 NOTE — Patient Instructions (Signed)
Stop Reglan/ metoclopramide and never take again  Continue Nexium 40 mg twice daily and pancreatic enzymes  See urologist as recommended  Continue Amitiza  Office visit in 6 months

## 2013-08-09 NOTE — Patient Instructions (Addendum)
[facility] Discharge Instructions  RECOMMENDATIONS MADE BY THE CONSULTANT AND ANY TEST RESULTS WILL BE SENT TO YOUR REFERRING PHYSICIAN.  EXAM FINDINGS BY THE PHYSICIAN TODAY AND SIGNS OR SYMPTOMS TO REPORT TO CLINIC OR PRIMARY PHYSICIAN: Exam and findings as discussed by Dr. Zigmund Daniel.  MEDICATIONS PRESCRIBED:  Refills for : Valium 5 mg Oxycodone 20 mg Methadone 5 mg Zolpedium 5mg    INSTRUCTIONS/FOLLOW-UP:  Follow-up in 4 weeks with labs and MD visit.    Thank you for choosing [facility] to provide your oncology and hematology care.  To afford each patient quality time with our providers, please arrive at least 15 minutes before your scheduled appointment time.  With your help, our goal is to use those 15 minutes to complete the necessary work-up to ensure our physicians have the information they need to help with your evaluation and healthcare recommendations.    Effective January 1st, 2014, we ask that you re-schedule your appointment with our physicians should you arrive 10 or more minutes late for your appointment.  We strive to give you quality time with our providers, and arriving late affects you and other patients whose appointments are after yours.    Again, thank you for choosing [facility].  Our hope is that these requests will decrease the amount of time that you wait before being seen by our physicians.       _____________________________________________________________  Should you have questions after your visit to [facility], please contact our office at [phone] between the hours of 8:30 a.m. and 5:00 p.m.  Voicemails left after 4:30 p.m. will not be returned until the following business day.  For prescription refill requests, have your pharmacy contact our office with your prescription refill request.

## 2013-08-21 NOTE — Progress Notes (Signed)
Quick Note:  LFTs normal.  Hx of fatty liver, previously mild elevation of alk phos.  Would monitor for now, repeat in 6 months. ______

## 2013-08-22 ENCOUNTER — Other Ambulatory Visit: Payer: Self-pay | Admitting: Gastroenterology

## 2013-08-22 DIAGNOSIS — R945 Abnormal results of liver function studies: Secondary | ICD-10-CM

## 2013-09-05 ENCOUNTER — Encounter (HOSPITAL_COMMUNITY): Payer: Self-pay

## 2013-09-05 ENCOUNTER — Encounter (HOSPITAL_COMMUNITY): Payer: Medicare Other | Attending: Hematology and Oncology

## 2013-09-05 ENCOUNTER — Ambulatory Visit (HOSPITAL_COMMUNITY): Payer: Medicare Other

## 2013-09-05 ENCOUNTER — Other Ambulatory Visit: Payer: Self-pay

## 2013-09-05 ENCOUNTER — Encounter (HOSPITAL_BASED_OUTPATIENT_CLINIC_OR_DEPARTMENT_OTHER): Payer: Medicare Other

## 2013-09-05 VITALS — BP 136/84 | HR 82 | Temp 98.0°F | Resp 20 | Wt 201.0 lb

## 2013-09-05 DIAGNOSIS — K859 Acute pancreatitis without necrosis or infection, unspecified: Secondary | ICD-10-CM | POA: Diagnosis not present

## 2013-09-05 DIAGNOSIS — R109 Unspecified abdominal pain: Secondary | ICD-10-CM | POA: Diagnosis not present

## 2013-09-05 DIAGNOSIS — D6859 Other primary thrombophilia: Secondary | ICD-10-CM

## 2013-09-05 DIAGNOSIS — G8929 Other chronic pain: Secondary | ICD-10-CM | POA: Diagnosis not present

## 2013-09-05 LAB — CBC WITH DIFFERENTIAL/PLATELET
Basophils Absolute: 0 10*3/uL (ref 0.0–0.1)
Eosinophils Absolute: 0.3 10*3/uL (ref 0.0–0.7)
Lymphocytes Relative: 43 % (ref 12–46)
Lymphs Abs: 3.5 10*3/uL (ref 0.7–4.0)
MCHC: 34 g/dL (ref 30.0–36.0)
Neutrophils Relative %: 46 % (ref 43–77)
Platelets: 280 10*3/uL (ref 150–400)
RBC: 4.5 MIL/uL (ref 4.22–5.81)
WBC: 8.2 10*3/uL (ref 4.0–10.5)

## 2013-09-05 LAB — D-DIMER, QUANTITATIVE: D-Dimer, Quant: <0.27 ug/mL-FEU (ref 0.00–0.48)

## 2013-09-05 LAB — LIPASE, BLOOD: Lipase: 25 U/L (ref 11–59)

## 2013-09-05 MED ORDER — TIZANIDINE HCL 4 MG PO CAPS: 4.0000 mg | ORAL_CAPSULE | Freq: Three times a day (TID) | ORAL | Status: DC

## 2013-09-05 MED ORDER — RIVAROXABAN 20 MG PO TABS: 20.0000 mg | ORAL_TABLET | Freq: Every day | ORAL | Status: DC

## 2013-09-05 MED ORDER — OXYCODONE HCL 30 MG PO TABS: 30.0000 mg | ORAL_TABLET | ORAL | Status: DC | PRN

## 2013-09-05 MED ORDER — METHADONE HCL 10 MG PO TABS: 50.0000 mg | ORAL_TABLET | Freq: Three times a day (TID) | ORAL | Status: DC

## 2013-09-05 MED ORDER — CYCLOBENZAPRINE HCL 10 MG PO TABS: 10.0000 mg | ORAL_TABLET | Freq: Three times a day (TID) | ORAL | Status: DC | PRN

## 2013-09-05 NOTE — Progress Notes (Signed)
Labs drawn today for cbc/diff,lipase,dimer

## 2013-09-05 NOTE — Patient Instructions (Signed)
[facility] Discharge Instructions  RECOMMENDATIONS MADE BY THE CONSULTANT AND ANY TEST RESULTS WILL BE SENT TO YOUR REFERRING PHYSICIAN.  Follow up in 3 months for appointment with MD.   Thank you for choosing [facility] to provide your oncology and hematology care.  To afford each patient quality time with our providers, please arrive at least 15 minutes before your scheduled appointment time.  With your help, our goal is to use those 15 minutes to complete the necessary work-up to ensure our physicians have the information they need to help with your evaluation and healthcare recommendations.    Effective January 1st, 2014, we ask that you re-schedule your appointment with our physicians should you arrive 10 or more minutes late for your appointment.  We strive to give you quality time with our providers, and arriving late affects you and other patients whose appointments are after yours.    Again, thank you for choosing [facility].  Our hope is that these requests will decrease the amount of time that you wait before being seen by our physicians.       _____________________________________________________________  Should you have questions after your visit to [facility], please contact our office at [phone] between the hours of 8:30 a.m. and 5:00 p.m.  Voicemails left after 4:30 p.m. will not be returned until the following business day.  For prescription refill requests, have your pharmacy contact our office with your prescription refill request.

## 2013-09-05 NOTE — Progress Notes (Signed)
[facility]  OFFICE PROGRESS NOTE  Alex Doe, MD No address on file  DIAGNOSIS: Chronic abdominal pain - Plan: CANCELED: Protein electrophoresis, urine, CANCELED: Von Willebrand multimeric, CANCELED: Reticulocytes  Hypercoagulable state - Plan: D-dimer, quantitative  Pancreatitis - Plan: CBC with Differential, Lipase  Chief Complaint  Patient presents with  . DVT  . pulmonary embolism    CURRENT THERAPY: Methadone and oxycodone plus pancreatic enzymes plus Nexium.  INTERVAL HISTORY: Alex Hoffman 52 y.o. 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  Still having muscle spasm in association with lower extremity pain. Pain level is about 5/10. He continues on Dulcolax to maintain bowel movements. He denies any nausea, vomiting, cough, wheezing, melena, hematochezia, hematuria, epistaxis, or hemoptysis.   MEDICAL HISTORY: Past Medical History  Diagnosis Date  . Pulmonary embolism 12/2010  . Cystic thyroid nodule     left  . HTN (hypertension)   . Heterozygous factor V Leiden mutation 12/2010  . Pancreatitis 05/2011    EUS Dr Christella Hartigan 06/09/11-> dilated main pancreatic duct in HOP, 2-3 filling defects distal pancreatic duct, peripancreatic fluid resolved  . C. difficile colitis 01/2011?  Marland Kitchen Arthritis   . Clotting disorder   . GERD (gastroesophageal reflux disease)   . Chills   . Fever   . Weight loss   . Hearing loss   . Nasal congestion   . Leg swelling   . Palpitations   . Abdominal distension   . Abdominal pain   . Nausea & vomiting   . Rectal pain   . Difficulty urinating   . Arthritis pain   . Headache(784.0)   . Weakness   . Bruises easily   . Anemia   . Dizziness   . Numbness and tingling     both hands, and both legs and feet  . Joint pain   . Blood in urine     hx of, none current   . Frequent urination at night   . Pneumonia as child  . Abnormal EKG     hx left bundle branch block on ekg  . Sleep apnea     STOPBANG=5  . Heterozygous factor V Leiden mutation   . Prostatitis, chronic   . Pancreatitis   . Pancreatitis 12/2011  . Arthritis     lower back. Dr. Charlynne Cousins in [place]    INTERIM HISTORY: has GERD (gastroesophageal reflux disease); Chronic abdominal pain; Dysphagia, unspecified; Rectal bleeding; Constipation; Pancreatitis; Abnormal LFTs; Hematuria, gross; Diarrhea; Hypokalemia; Cholecystitis; RUQ pain; Hypercoagulable state; and Pain in limb on his problem list.    ALLERGIES:  is allergic to reglan.  MEDICATIONS: has a current medication list which includes the following prescription(s): amlodipine, diazepam, diazepam, esomeprazole, ibuprofen, lubiprostone, methadone, methadone, methadone, oxycodone, oxycodone, pancrelipase (lip-prot-amyl), phenazopyridine, prochlorperazine, rivaroxaban, silodosin, tizanidine, xarelto, zolpidem, and zolpidem.  SURGICAL HISTORY:  Past Surgical History  Procedure Laterality Date  . Neck surgery  1999    C5/C6 fusion, limited neck mobility especially turning to left  . Knee surgery  1980's    arthroscopy, left knee  . Femur fracture surgery  age 34    right leg  . Nerve surgery  1980's    right thumb  . Colonoscopy  01/2011    Dr. Zenon Mayo at [facility]-->normal  . Esophagogastroduodenoscopy  01/2011    Dr. Zenon Mayo at [facility]-->bile reflux in stomach-->patient  c/o inadequate conscious sedation (Fentanyl 100mg /Versed 8mg )  . Ercp  07/2011    Dr. Logan Bores [facility]-> pancreatic enterotomy, pancreatic duct stones removed, bile duct not manipulated, no stents  . Cholecystectomy  01/25/2012    Procedure: LAPAROSCOPIC CHOLECYSTECTOMY WITH INTRAOPERATIVE CHOLANGIOGRAM;  Surgeon: Robyne Askew, MD;  Location: [facility];  Service: General;  Laterality: N/A;  . Cystoscopy with hydrodistension and biopsy  01/25/2012    Procedure:  CYSTOSCOPY/BIOPSY/HYDRODISTENSION;  Surgeon: Anner Crete, MD;  Location: [facility];  Service: Urology;  Laterality: N/A;  Cystoscopy and Hydrodistension of the Bladder  . 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 There is no history of Colon cancer or Liver disease.  SOCIAL HISTORY:  reports that he quit smoking about 8 months ago. His smoking use included Cigarettes. He has a 35 pack-year smoking history. He has never used smokeless tobacco. He reports that he does not drink alcohol or use illicit drugs.  REVIEW OF SYSTEMS:  Other than that discussed above is noncontributory.  PHYSICAL EXAMINATION: ECOG PERFORMANCE STATUS: 1 - Symptomatic but completely ambulatory  There were no vitals taken for this visit.  GENERAL:alert, no distress and comfortable  SKIN: skin color, texture, turgor are normal, no rashes or significant lesions  EYES: PERLA; Conjunctiva are pink and non-injected, sclera clear  OROPHARYNX:no exudate, no erythema on lips, buccal mucosa, or tongue.  NECK: supple, thyroid normal size, non-tender, without nodularity. No masses  CHEST: Increased AP diameter with no breast masses. No gynecomastia.  LYMPH: no palpable lymphadenopathy in the cervical, axillary or inguinal  LUNGS: clear to auscultation and percussion with normal breathing effort  HEART: regular rate & rhythm and no murmurs and no lower extremity edema  ABDOMEN:abdomen soft, non-tender and normal bowel sounds  MUSCULOSKELETAL:no cyanosis of digits and no clubbing. Range of motion normal. Mottledl lower extremity appearance with superficial  varicosities and exquisite tenderness to palpation.  NEURO: alert & oriented x 3 with fluent speech, no focal motor/sensory deficits    LABORATORY DATA: Infusion on 09/05/2013  Component Date Value Range Status  . D-Dimer, Quant 09/05/2013 <0.27  0.00 - 0.48 ug/mL-FEU Final   Comment:                                 AT THE INHOUSE ESTABLISHED CUTOFF                          VALUE OF 0.48 ug/mL FEU,                          THIS ASSAY HAS BEEN DOCUMENTED                          IN THE LITERATURE TO HAVE                          A SENSITIVITY AND NEGATIVE  PREDICTIVE VALUE OF AT LEAST                          98 TO 99%.  THE TEST RESULT                          SHOULD BE CORRELATED WITH                          AN ASSESSMENT OF THE CLINICAL                          PROBABILITY OF DVT / VTE.  . WBC 09/05/2013 8.2  4.0 - 10.5 K/uL Final  . RBC 09/05/2013 4.50  4.22 - 5.81 MIL/uL Final  . Hemoglobin 09/05/2013 13.1  13.0 - 17.0 g/dL Final  . HCT 16/08/9603 38.5* 39.0 - 52.0 % Final  . MCV 09/05/2013 85.6  78.0 - 100.0 fL Final  . MCH 09/05/2013 29.1  26.0 - 34.0 pg Final  . MCHC 09/05/2013 34.0  30.0 - 36.0 g/dL Final  . RDW 54/07/8118 12.7  11.5 - 15.5 % Final  . Platelets 09/05/2013 280  150 - 400 K/uL Final  . Neutrophils Relative % 09/05/2013 46  43 - 77 % Final  . Neutro Abs 09/05/2013 3.8  1.7 - 7.7 K/uL Final  . Lymphocytes Relative 09/05/2013 43  12 - 46 % Final  . Lymphs Abs 09/05/2013 3.5  0.7 - 4.0 K/uL Final  . Monocytes Relative 09/05/2013 7  3 - 12 % Final  . Monocytes Absolute 09/05/2013 0.6  0.1 - 1.0 K/uL Final  . Eosinophils Relative 09/05/2013 4  0 - 5 % Final  . Eosinophils Absolute 09/05/2013 0.3  0.0 - 0.7 K/uL Final  . Basophils Relative 09/05/2013 0  0 - 1 % Final  . Basophils Absolute 09/05/2013 0.0  0.0 - 0.1 K/uL Final  . Lipase 09/05/2013 25  11 - 59 U/L Final    PATHOLOGY: none new.  Urinalysis    Component Value Date/Time    COLORURINE YELLOW 01/11/2012 1537   APPEARANCEUR CLEAR 01/11/2012 1537   LABSPEC 1.010 01/11/2012 1537   PHURINE 6.0 01/11/2012 1537   GLUCOSEU NEGATIVE 01/11/2012 1537   HGBUR NEGATIVE 01/11/2012 1537   BILIRUBINUR NEGATIVE 01/11/2012 1537   KETONESUR NEGATIVE 01/11/2012 1537   PROTEINUR NEGATIVE 01/11/2012 1537   UROBILINOGEN 0.2 01/11/2012 1537   NITRITE NEGATIVE 01/11/2012 1537   LEUKOCYTESUR NEGATIVE 01/11/2012 1537    RADIOGRAPHIC STUDIES: No results found.  ASSESSMENT: 1. Abdominal pain from previous pancreatitis, co #5.ntro Flexeril 10 mg 3 times a day.Fleclled.  #2. Thrombophilia     PLAN:  1 Xarelto 20 mg daily after completing 21 days of 15 mg twice a day.  #2. Methadone 50 mg every 8 hours along with oxycodone 30 mg every 4 hours as needed for breakthrough pain.  #3. Valium 5 mg as needed, Ambien 5 mg at bedtime.  #4Vickii Chafe prescription through [facility] filled last visit. #5.Zanaflex 4mg  TID.     All questions were answered. The patient knows to call the clinic with any problems, questions or concerns. We can certainly see the patient much sooner if necessary.   I spent 25 minutes counseling the patient face to face. The total time spent in the appointment was 30 minutes.    Maurilio Lovely, MD 09/05/2013 12:58  PM   

## 2013-09-16 ENCOUNTER — Ambulatory Visit (HOSPITAL_COMMUNITY): Payer: Self-pay

## 2013-10-03 ENCOUNTER — Encounter (HOSPITAL_COMMUNITY): Payer: Self-pay

## 2013-10-03 ENCOUNTER — Encounter (HOSPITAL_COMMUNITY): Payer: Medicare Other | Attending: Hematology and Oncology

## 2013-10-03 ENCOUNTER — Encounter (HOSPITAL_BASED_OUTPATIENT_CLINIC_OR_DEPARTMENT_OTHER): Payer: Medicare Other

## 2013-10-03 VITALS — BP 130/73 | HR 75 | Temp 97.0°F | Resp 16 | Wt 207.7 lb

## 2013-10-03 DIAGNOSIS — D6859 Other primary thrombophilia: Secondary | ICD-10-CM | POA: Diagnosis not present

## 2013-10-03 DIAGNOSIS — Z7901 Long term (current) use of anticoagulants: Secondary | ICD-10-CM | POA: Diagnosis not present

## 2013-10-03 DIAGNOSIS — G8929 Other chronic pain: Secondary | ICD-10-CM | POA: Diagnosis not present

## 2013-10-03 DIAGNOSIS — R109 Unspecified abdominal pain: Secondary | ICD-10-CM

## 2013-10-03 LAB — D-DIMER, QUANTITATIVE: D-Dimer, Quant: <0.27 ug/mL-FEU (ref 0.00–0.48)

## 2013-10-03 MED ORDER — OXYCODONE HCL 30 MG PO TABS: 30.0000 mg | ORAL_TABLET | ORAL | Status: DC | PRN

## 2013-10-03 MED ORDER — METHADONE HCL 10 MG PO TABS: 50.0000 mg | ORAL_TABLET | Freq: Three times a day (TID) | ORAL | Status: DC

## 2013-10-03 NOTE — Patient Instructions (Signed)
[facility] Discharge Instructions  RECOMMENDATIONS MADE BY THE CONSULTANT AND ANY TEST RESULTS WILL BE SENT TO YOUR REFERRING PHYSICIAN.  EXAM FINDINGS BY THE PHYSICIAN TODAY AND SIGNS OR SYMPTOMS TO REPORT TO CLINIC OR PRIMARY PHYSICIAN: Exam and findings as discussed by Dr. Zigmund Daniel.  MEDICATIONS RE-PRESCRIBED:  1.  Methadone 2.  Oxycodone  INSTRUCTIONS/FOLLOW-UP: 1.  Please return in 4 weeks as scheduled to see the MD. 2.  Use your TED hose!!  Thank you for choosing [facility] to provide your oncology and hematology care.  To afford each patient quality time with our providers, please arrive at least 15 minutes before your scheduled appointment time.  With your help, our goal is to use those 15 minutes to complete the necessary work-up to ensure our physicians have the information they need to help with your evaluation and healthcare recommendations.    Effective January 1st, 2014, we ask that you re-schedule your appointment with our physicians should you arrive 10 or more minutes late for your appointment.  We strive to give you quality time with our providers, and arriving late affects you and other patients whose appointments are after yours.    Again, thank you for choosing [facility].  Our hope is that these requests will decrease the amount of time that you wait before being seen by our physicians.       _____________________________________________________________  Should you have questions after your visit to [facility], please contact our office at [phone] between the hours of 8:30 a.m. and 5:00 p.m.  Voicemails left after 4:30 p.m. will not be returned until the following business day.  For prescription refill requests, have your pharmacy contact our office with your prescription refill request.

## 2013-10-03 NOTE — Progress Notes (Signed)
Labs drawn today for dimer 

## 2013-10-03 NOTE — Progress Notes (Signed)
[facility]  OFFICE PROGRESS NOTE  Alex Doe, MD No address on file  DIAGNOSIS: Chronic abdominal pain  Hypercoagulable state  Chief Complaint  Patient presents with  . Pain  . thrombophilia    CURRENT THERAPY: 52 y.o. 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 Chronic abdominal pain and lower extremity pain to an intensity of 4-5/10 with worsening to 8 prior to sleep. He does try to elevate his legs when sitting. He denies any chest pain, PND, orthopnea, palpitations, and intends to see a dentist tomorrow for checkup. Appetite been good with no nausea or vomiting. He continues taking pancreatic enzymes along with Nexium and oral opioids. Lower 70 0 chronically swollen with occasional purplish discoloration.  MEDICAL HISTORY: Past Medical History  Diagnosis Date  . Pulmonary embolism 12/2010  . Cystic thyroid nodule     left  . HTN (hypertension)   . Heterozygous factor V Leiden mutation 12/2010  . Pancreatitis 05/2011    EUS Dr Christella Hartigan 06/09/11-> dilated main pancreatic duct in HOP, 2-3 filling defects distal pancreatic duct, peripancreatic fluid resolved  . C. difficile colitis 01/2011?  Marland Kitchen Arthritis   . Clotting disorder   . GERD (gastroesophageal reflux disease)   . Chills   . Fever   . Weight loss   . Hearing loss   . Nasal congestion   . Leg swelling   . Palpitations   . Abdominal distension   . Abdominal pain   . Nausea & vomiting   . Rectal pain   . Difficulty urinating   . Arthritis  pain   . Headache(784.0)   . Weakness   . Bruises easily   . Anemia   . Dizziness   . Numbness and tingling     both hands, and both legs and feet  . Joint pain   . Blood in urine     hx of, none current  . Frequent urination at night   . Pneumonia as child  . Abnormal EKG     hx left bundle branch block on ekg  . Sleep apnea     STOPBANG=5  . Heterozygous factor V Leiden mutation   . Prostatitis, chronic   . Pancreatitis   . Pancreatitis 12/2011  . Arthritis     lower back. Dr. Charlynne Cousins in [place]    INTERIM HISTORY: has GERD (gastroesophageal reflux disease); Chronic abdominal pain; Dysphagia, unspecified; Rectal bleeding; Constipation; Pancreatitis; Abnormal LFTs; Hematuria, gross; Diarrhea; Hypokalemia; Cholecystitis; RUQ pain; Hypercoagulable state; and Pain in limb on his problem list.    ALLERGIES:  is allergic to reglan.  MEDICATIONS: has a current medication list which includes the following prescription(s): amlodipine, cyclobenzaprine, diazepam, diazepam, esomeprazole, ibuprofen, lubiprostone, methadone, oxycodone, oxycodone, pancrelipase (lip-prot-amyl), phenazopyridine, prochlorperazine, rivaroxaban, silodosin, tizanidine, tizanidine, and zolpidem.  SURGICAL HISTORY:  Past Surgical History  Procedure Laterality Date  . Neck surgery  1999    C5/C6 fusion, limited neck mobility especially turning to left  . Knee surgery  1980's    arthroscopy, left knee  . Femur fracture surgery  age 37  right leg  . Nerve surgery  1980's    right thumb  . Colonoscopy  01/2011    Dr. Zenon Mayo at [facility]-->normal  . Esophagogastroduodenoscopy  01/2011    Dr. Zenon Mayo at [facility]-->bile reflux in stomach-->patient c/o inadequate conscious sedation (Fentanyl 100mg /Versed 8mg )  . Ercp  07/2011    Dr. Logan Bores [facility]-> pancreatic enterotomy, pancreatic duct stones removed, bile duct not manipulated, no stents  . Cholecystectomy  01/25/2012    Procedure: LAPAROSCOPIC CHOLECYSTECTOMY  WITH INTRAOPERATIVE CHOLANGIOGRAM;  Surgeon: Robyne Askew, MD;  Location: [facility];  Service: General;  Laterality: N/A;  . Cystoscopy with hydrodistension and biopsy  01/25/2012    Procedure: CYSTOSCOPY/BIOPSY/HYDRODISTENSION;  Surgeon: Anner Crete, MD;  Location: [facility];  Service: Urology;  Laterality: N/A;  Cystoscopy and Hydrodistension of the Bladder  . 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 There is no history of Colon cancer or Liver disease.  SOCIAL HISTORY:  reports that he quit smoking about 9 months ago. His smoking use included Cigarettes. He has a 35 pack-year smoking history. He has never used smokeless tobacco. He reports that he does not drink alcohol or use illicit drugs.  REVIEW OF SYSTEMS:  Other than that discussed above is noncontributory.  PHYSICAL EXAMINATION: ECOG PERFORMANCE STATUS: 1 - Symptomatic but completely ambulatory  Blood pressure 130/73, pulse 75, temperature 97 F (36.1 C), temperature source Oral, resp. rate 16, weight 207 lb 11.2 oz (94.212 kg).  GENERAL:alert, no distress and comfortable SKIN: skin color, texture, turgor are normal, no rashes or significant lesions EYES: PERLA; Conjunctiva are pink and non-injected, sclera clear OROPHARYNX:no exudate, no erythema on lips, buccal mucosa, or tongue. NECK: supple, thyroid normal size, non-tender, without nodularity. No masses CHEST: Increased AP diameter with no gynecomastia. LYMPH:  no palpable lymphadenopathy in the cervical, axillary or inguinal LUNGS: clear to auscultation and percussion with normal breathing effort HEART:  regular rate & rhythm and no murmurs. ABDOMEN:abdomen soft, non-tender and normal bowel sounds. No distention with guarding present. No rebound tenderness. MUSCULOSKELETAL:no cyanosis of digits and no clubbing. Range of motion normal. Bilateral lower extremity mottling with negative Homans sign. Diffuse tenderness of both lower 70s. NEURO: alert & oriented x 3 with fluent speech, no focal motor/sensory deficits   LABORATORY DATA: Infusion on 09/05/2013  Component Date Value Range Status  . D-Dimer, Quant 09/05/2013 <0.27  0.00 - 0.48 ug/mL-FEU Final   Comment:                                 AT THE INHOUSE ESTABLISHED CUTOFF                          VALUE OF 0.48 ug/mL FEU,                          THIS ASSAY HAS BEEN DOCUMENTED  IN THE LITERATURE TO HAVE                          A SENSITIVITY AND NEGATIVE                          PREDICTIVE VALUE OF AT LEAST                          98 TO 99%.  THE TEST RESULT                          SHOULD BE CORRELATED WITH                          AN ASSESSMENT OF THE CLINICAL                          PROBABILITY OF DVT / VTE.  . WBC 09/05/2013 8.2  4.0 - 10.5 K/uL Final  . RBC 09/05/2013 4.50  4.22 - 5.81 MIL/uL Final  . Hemoglobin 09/05/2013 13.1  13.0 - 17.0 g/dL Final  . HCT 09/81/1914 38.5* 39.0 - 52.0 % Final  . MCV 09/05/2013 85.6  78.0 - 100.0 fL Final  . MCH 09/05/2013 29.1  26.0 - 34.0 pg Final  . MCHC 09/05/2013 34.0  30.0 - 36.0 g/dL Final  . RDW 78/29/5621 12.7  11.5 - 15.5 % Final  . Platelets 09/05/2013 280  150 - 400 K/uL Final  . Neutrophils Relative % 09/05/2013 46  43 - 77 % Final  . Neutro Abs 09/05/2013 3.8  1.7 - 7.7 K/uL Final  . Lymphocytes Relative 09/05/2013 43  12 - 46 % Final  . Lymphs Abs 09/05/2013 3.5  0.7 - 4.0 K/uL Final  . Monocytes Relative 09/05/2013 7  3 - 12 % Final  . Monocytes Absolute 09/05/2013 0.6  0.1 - 1.0 K/uL Final  . Eosinophils Relative 09/05/2013 4  0 - 5 % Final  .  Eosinophils Absolute 09/05/2013 0.3  0.0 - 0.7 K/uL Final  . Basophils Relative 09/05/2013 0  0 - 1 % Final  . Basophils Absolute 09/05/2013 0.0  0.0 - 0.1 K/uL Final  . Lipase 09/05/2013 25  11 - 59 U/L Final    PATHOLOGY: No new pathology.  Urinalysis    Component Value Date/Time   COLORURINE YELLOW 01/11/2012 1537   APPEARANCEUR CLEAR 01/11/2012 1537   LABSPEC 1.010 01/11/2012 1537   PHURINE 6.0 01/11/2012 1537   GLUCOSEU NEGATIVE 01/11/2012 1537   HGBUR NEGATIVE 01/11/2012 1537   BILIRUBINUR NEGATIVE 01/11/2012 1537   KETONESUR NEGATIVE 01/11/2012 1537   PROTEINUR NEGATIVE 01/11/2012 1537   UROBILINOGEN 0.2 01/11/2012 1537   NITRITE NEGATIVE 01/11/2012 1537   LEUKOCYTESUR NEGATIVE 01/11/2012 1537    RADIOGRAPHIC STUDIES: No results found.  ASSESSMENT:  #1. Chronic abdominal pain from previous pancreatitis. #2. Hypercoagulable state on long-term Xarelto. #3. 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 #4. Chronic obstructive pulmonary disease.   PLAN:  #1. Continue methadone and oxycodone. #2. Stop Xarelto tonight for dental appointment tomorrow and resume treatment tomorrow night. #3. Referral to a vascular surgeon for possible intervention #4. Continue Nexium and pancreatic enzymes. #5. Followup in 4 weeks with d-dimer   All questions were answered. The patient knows to call the clinic with any  problems, questions or concerns. We can certainly see the patient much sooner if necessary.   I spent 25 minutes counseling the patient face to face. The total time spent in the appointment was 30 minutes.    Maurilio Lovely, MD 10/03/2013 11:45 AM

## 2013-10-08 ENCOUNTER — Telehealth: Payer: Self-pay | Admitting: Internal Medicine

## 2013-10-08 NOTE — Telephone Encounter (Signed)
Pt called to make OV and is aware of OV on 12/31 with AS. He asked when he was due to have his labs done. I told him that I would check with nurse and have her call him back either today or tomorrow since it was late in the day. Patient agreed. [phone]

## 2013-10-10 NOTE — Telephone Encounter (Signed)
Called pt- he is not due for blood work until April 2015. He said he still has some pain in his side and if AS felt like he needed to have some more blood work done prior to his ov, he would have it done or he can wait and let her decide at the ov. I told him that if she wanted any blood work prior to ov that I would call him back and let him know.

## 2013-10-14 NOTE — Telephone Encounter (Signed)
Go ahead and check HFP. I can review this at his OV.

## 2013-10-16 ENCOUNTER — Other Ambulatory Visit: Payer: Self-pay | Admitting: Gastroenterology

## 2013-10-16 ENCOUNTER — Other Ambulatory Visit: Payer: Self-pay

## 2013-10-16 DIAGNOSIS — R1011 Right upper quadrant pain: Secondary | ICD-10-CM

## 2013-10-16 DIAGNOSIS — R945 Abnormal results of liver function studies: Secondary | ICD-10-CM

## 2013-10-16 NOTE — Telephone Encounter (Signed)
Lab order done and faxed to the lab. Pt is aware.

## 2013-10-30 ENCOUNTER — Telehealth: Payer: Self-pay | Admitting: Gastroenterology

## 2013-10-30 ENCOUNTER — Ambulatory Visit: Payer: Medicare Other | Admitting: Gastroenterology

## 2013-10-30 ENCOUNTER — Encounter: Payer: Self-pay | Admitting: Gastroenterology

## 2013-10-30 NOTE — Telephone Encounter (Signed)
Pt was a no show

## 2013-10-30 NOTE — Telephone Encounter (Signed)
Mailed letter °

## 2013-11-04 ENCOUNTER — Encounter (HOSPITAL_COMMUNITY): Payer: Medicare Other | Attending: Hematology and Oncology

## 2013-11-04 ENCOUNTER — Encounter (HOSPITAL_COMMUNITY): Payer: Self-pay

## 2013-11-04 ENCOUNTER — Encounter (HOSPITAL_COMMUNITY): Payer: Medicare Other

## 2013-11-04 VITALS — BP 163/93 | HR 76 | Temp 97.7°F | Resp 20 | Wt 206.4 lb

## 2013-11-04 DIAGNOSIS — G8929 Other chronic pain: Secondary | ICD-10-CM | POA: Diagnosis not present

## 2013-11-04 DIAGNOSIS — Z7901 Long term (current) use of anticoagulants: Secondary | ICD-10-CM

## 2013-11-04 DIAGNOSIS — R109 Unspecified abdominal pain: Secondary | ICD-10-CM | POA: Diagnosis not present

## 2013-11-04 DIAGNOSIS — D6859 Other primary thrombophilia: Secondary | ICD-10-CM | POA: Diagnosis not present

## 2013-11-04 LAB — D-DIMER, QUANTITATIVE (NOT AT ARMC)

## 2013-11-04 MED ORDER — METHADONE HCL 10 MG PO TABS: 50.0000 mg | ORAL_TABLET | Freq: Three times a day (TID) | ORAL | Status: DC

## 2013-11-04 MED ORDER — OXYCODONE HCL 30 MG PO TABS: 30.0000 mg | ORAL_TABLET | ORAL | Status: DC | PRN

## 2013-11-04 NOTE — Progress Notes (Signed)
[facility]  OFFICE PROGRESS NOTE  Alex Doe, MD No address on file  DIAGNOSIS: Chronic abdominal pain  Hypercoagulable state - Plan: D-dimer, quantitative, D-dimer, quantitative, D-dimer, quantitative  Chief Complaint  Patient presents with  . Pain  . 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 Abdominal pain control without nausea or vomiting. He does get constipated but continues to use Dulcolax with stool softeners. He denies any fever, night sweats, worsening lower extremity swelling or redness, chest pain, PND, orthopnea, palpitations, headache, epistaxis, melena, or hematochezia.   MEDICAL HISTORY: Past Medical History  Diagnosis Date  . Pulmonary embolism 12/2010  . Cystic thyroid nodule     left  . HTN (hypertension)   . Heterozygous factor V Leiden mutation 12/2010  . Pancreatitis 05/2011    EUS Alex Hoffman 06/09/11-> dilated main pancreatic duct in HOP, 2-3 filling defects distal pancreatic duct, peripancreatic fluid resolved  . C. difficile colitis 01/2011?  Marland Kitchen Arthritis   . Clotting disorder   . GERD (gastroesophageal reflux disease)   . Chills   . Fever   . Weight loss   . Hearing loss   . Nasal congestion   . Leg swelling   . Palpitations   . Abdominal distension   . Abdominal pain   . Nausea & vomiting   . Rectal pain   . Difficulty urinating   . Arthritis pain   . Headache(784.0)   . Weakness   . Bruises easily   . Anemia   . Dizziness   . Numbness and tingling     both hands, and both legs and feet  . Joint pain   . Blood in urine     hx of, none current  .  Frequent urination at night   . Pneumonia as child  . Abnormal EKG     hx left bundle branch block on ekg  . Sleep apnea     STOPBANG=5  . Heterozygous factor V Leiden mutation   . Prostatitis, chronic   . Pancreatitis   . Pancreatitis 12/2011  . Arthritis     lower back. Alex Hoffman in [place]    INTERIM HISTORY: has GERD (gastroesophageal reflux disease); Chronic abdominal pain; Dysphagia, unspecified; Rectal bleeding; Constipation; Pancreatitis; Abnormal LFTs; Hematuria, gross; Diarrhea; Hypokalemia; Cholecystitis; RUQ pain; Hypercoagulable state; and Pain in limb on his problem list.    ALLERGIES:  is allergic to reglan.  MEDICATIONS: has a current medication list which includes the following prescription(s): amlodipine, diazepam, esomeprazole, lubiprostone, methadone, oxycodone, pancrelipase (lip-prot-amyl), prochlorperazine, rivaroxaban, silodosin, tizanidine, zolpidem, cyclobenzaprine, and phenazopyridine.  SURGICAL HISTORY:  Past Surgical History  Procedure Laterality Date  . Neck surgery  1999    C5/C6 fusion, limited neck mobility especially turning to left  . Knee surgery  1980's    arthroscopy, left knee  . Femur fracture surgery  age 71    right leg  . Nerve surgery  1980's    right thumb  . Colonoscopy  01/2011    Alex Hoffman at [facility]-->normal  . Esophagogastroduodenoscopy  01/2011    Alex Hoffman at [facility]-->bile reflux in stomach-->patient c/o inadequate conscious sedation (Fentanyl 100mg /Versed 8mg )  .  Ercp  07/2011    Alex Hoffman [facility]-> pancreatic enterotomy, pancreatic duct stones removed, bile duct not manipulated, no stents  . Cholecystectomy  01/25/2012    Procedure: LAPAROSCOPIC CHOLECYSTECTOMY WITH INTRAOPERATIVE CHOLANGIOGRAM;  Surgeon: Alex AskewPaul S Toth III, MD;  Location: [facility];  Service: General;  Laterality: N/A;  . Cystoscopy with hydrodistension and biopsy  01/25/2012    Procedure: CYSTOSCOPY/BIOPSY/HYDRODISTENSION;  Surgeon: Alex CreteJohn J Wrenn, MD;   Location: [facility];  Service: Urology;  Laterality: N/A;  Cystoscopy and Hydrodistension of the Bladder  . Colonoscopy with propofol  11/22/2012    ZOX:WRUEAVWRMR:friable anal rectum/fissure not identified although he certainly could have a chronic anal fissure     FAMILY HISTORY: family history includes Cancer in his maternal aunt and maternal grandfather; GI problems in his brother and another family member; Heart attack in his brother; Heart disease in his maternal uncle; Hypertension in his father and mother; Stroke in his brother. There is no history of Colon cancer or Liver disease.  SOCIAL HISTORY:  reports that he quit smoking about 10 months ago. His smoking use included Cigarettes. He has a 35 pack-year smoking history. He has never used smokeless tobacco. He reports that he does not drink alcohol or use illicit drugs.  REVIEW OF SYSTEMS:  Other than that discussed above is noncontributory.  PHYSICAL EXAMINATION: ECOG PERFORMANCE STATUS: 1 - Symptomatic but completely ambulatory  Blood pressure 163/93, pulse 76, temperature 97.7 F (36.5 C), temperature source Oral, resp. rate 20, weight 206 lb 6.4 oz (93.622 kg).  GENERAL:alert, no distress and comfortable SKIN: skin color, texture, turgor are normal, no rashes or significant lesions EYES: PERLA; Conjunctiva are pink and non-injected, sclera clear OROPHARYNX:no exudate, no erythema on lips, buccal mucosa, or tongue. NECK: supple, thyroid normal size, non-tender, without nodularity. No masses CHEST: Increased AP diameter with no breast masses. LYMPH:  no palpable lymphadenopathy in the cervical, axillary or inguinal LUNGS: clear to auscultation and percussion with normal breathing effort HEART: regular rate & rhythm and no murmurs. P2 is tolerable at the apex. ABDOMEN:abdomen soft, non-tender and normal bowel sounds MUSCULOSKELETAL:no cyanosis of digits and no clubbing. Range of motion normal. . Chronic lower 70 varicosities with tenderness to  mild palpation. NEURO: alert & oriented x 3 with fluent speech, no focal motor/sensory deficits   LABORATORY DATA: No visits with results within 30 Day(s) from this visit. Latest known visit with results is:  Infusion on 10/03/2013  Component Date Value Range Status  . D-Dimer, Quant 10/03/2013 <0.27  0.00 - 0.48 ug/mL-FEU Final   Comment:                                 AT THE INHOUSE ESTABLISHED CUTOFF                          VALUE OF 0.48 ug/mL FEU,                          THIS ASSAY HAS BEEN DOCUMENTED                          IN THE LITERATURE TO HAVE                          A SENSITIVITY AND NEGATIVE  PREDICTIVE VALUE OF AT LEAST                          98 TO 99%.  THE TEST RESULT                          SHOULD BE CORRELATED WITH                          AN ASSESSMENT OF THE CLINICAL                          PROBABILITY OF DVT / VTE.    PATHOLOGY: No new pathology.  Urinalysis    Component Value Date/Time   COLORURINE YELLOW 01/11/2012 1537   APPEARANCEUR CLEAR 01/11/2012 1537   LABSPEC 1.010 01/11/2012 1537   PHURINE 6.0 01/11/2012 1537   GLUCOSEU NEGATIVE 01/11/2012 1537   HGBUR NEGATIVE 01/11/2012 1537   BILIRUBINUR NEGATIVE 01/11/2012 1537   KETONESUR NEGATIVE 01/11/2012 1537   PROTEINUR NEGATIVE 01/11/2012 1537   UROBILINOGEN 0.2 01/11/2012 1537   NITRITE NEGATIVE 01/11/2012 1537   LEUKOCYTESUR NEGATIVE 01/11/2012 1537    RADIOGRAPHIC STUDIES: No results found.  ASSESSMENT:  #1. Chronic abdominal pain from previous pancreatitis.  #2. Hypercoagulable state on long-term Xarelto.  #3. 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  #4. Chronic obstructive pulmonary disease    PLAN:  #1. Continue methadone and oxycodone. #2. Continue Nexium and pancreatic enzymes. #3. Followup in 4 weeks with d-dimer. #4. DMV placard form filled out for handicapped parking.   All questions were answered. The patient knows  to call the clinic with any problems, questions or concerns. We can certainly see the patient much sooner if necessary.   I spent 25 minutes counseling the patient face to face. The total time spent in the appointment was 30 minutes.    Maurilio Lovely, MD 11/04/2013 12:03 PM

## 2013-11-04 NOTE — Progress Notes (Signed)
Labs drawn today for dimer 

## 2013-11-04 NOTE — Patient Instructions (Signed)
.  [facility] Discharge Instructions  RECOMMENDATIONS MADE BY THE CONSULTANT AND ANY TEST RESULTS WILL BE SENT TO YOUR REFERRING PHYSICIAN.  EXAM FINDINGS BY THE PHYSICIAN TODAY AND SIGNS OR SYMPTOMS TO REPORT TO CLINIC OR PRIMARY PHYSICIAN: Exam and findings as discussed by Dr. Zigmund DanielFormanek.  MEDICATIONS PRESCRIBED:  Continue same meds  INSTRUCTIONS/FOLLOW-UP: 1 month  Thank you for choosing [facility] to provide your oncology and hematology care.  To afford each patient quality time with our providers, please arrive at least 15 minutes before your scheduled appointment time.  With your help, our goal is to use those 15 minutes to complete the necessary work-up to ensure our physicians have the information they need to help with your evaluation and healthcare recommendations.    Effective January 1st, 2014, we ask that you re-schedule your appointment with our physicians should you arrive 10 or more minutes late for your appointment.  We strive to give you quality time with our providers, and arriving late affects you and other patients whose appointments are after yours.    Again, thank you for choosing [facility].  Our hope is that these requests will decrease the amount of time that you wait before being seen by our physicians.       _____________________________________________________________  Should you have questions after your visit to [facility], please contact our office at [phone] between the hours of 8:30 a.m. and 5:00 p.m.  Voicemails left after 4:30 p.m. will not be returned until the following business day.  For prescription refill requests, have your pharmacy contact our office with your prescription refill request.

## 2013-11-28 ENCOUNTER — Ambulatory Visit: Payer: Medicare Other | Admitting: Gastroenterology

## 2013-11-28 ENCOUNTER — Encounter: Payer: Self-pay | Admitting: Gastroenterology

## 2013-11-28 ENCOUNTER — Telehealth: Payer: Self-pay | Admitting: Gastroenterology

## 2013-11-28 NOTE — Telephone Encounter (Signed)
Pt was a no show and letter was mailed °

## 2013-12-02 ENCOUNTER — Encounter (HOSPITAL_BASED_OUTPATIENT_CLINIC_OR_DEPARTMENT_OTHER): Payer: Medicare Other

## 2013-12-02 ENCOUNTER — Encounter (HOSPITAL_COMMUNITY): Payer: Medicare Other | Attending: Hematology and Oncology

## 2013-12-02 ENCOUNTER — Encounter (HOSPITAL_COMMUNITY): Payer: Self-pay

## 2013-12-02 VITALS — BP 130/73 | HR 63 | Temp 97.2°F | Resp 20 | Wt 206.6 lb

## 2013-12-02 DIAGNOSIS — R109 Unspecified abdominal pain: Secondary | ICD-10-CM

## 2013-12-02 DIAGNOSIS — K859 Acute pancreatitis without necrosis or infection, unspecified: Secondary | ICD-10-CM | POA: Diagnosis not present

## 2013-12-02 DIAGNOSIS — R300 Dysuria: Secondary | ICD-10-CM

## 2013-12-02 DIAGNOSIS — G8929 Other chronic pain: Secondary | ICD-10-CM | POA: Diagnosis not present

## 2013-12-02 DIAGNOSIS — D6859 Other primary thrombophilia: Secondary | ICD-10-CM

## 2013-12-02 LAB — URINALYSIS, ROUTINE W REFLEX MICROSCOPIC
Bilirubin Urine: NEGATIVE
GLUCOSE, UA: NEGATIVE mg/dL
HGB URINE DIPSTICK: NEGATIVE
Ketones, ur: NEGATIVE mg/dL
Leukocytes, UA: NEGATIVE
Nitrite: NEGATIVE
PH: 5.5 (ref 5.0–8.0)
Protein, ur: NEGATIVE mg/dL
Urobilinogen, UA: 0.2 mg/dL (ref 0.0–1.0)

## 2013-12-02 LAB — D-DIMER, QUANTITATIVE: D-Dimer, Quant: 0.28 ug/mL-FEU (ref 0.00–0.48)

## 2013-12-02 MED ORDER — PHENAZOPYRIDINE HCL 200 MG PO TABS: 200.0000 mg | ORAL_TABLET | Freq: Three times a day (TID) | ORAL | Status: DC | PRN

## 2013-12-02 MED ORDER — METHADONE HCL 10 MG PO TABS: 50.0000 mg | ORAL_TABLET | Freq: Three times a day (TID) | ORAL | Status: DC

## 2013-12-02 MED ORDER — OXYCODONE HCL 30 MG PO TABS: 30.0000 mg | ORAL_TABLET | ORAL | Status: DC | PRN

## 2013-12-02 NOTE — Progress Notes (Signed)
[facility]  OFFICE PROGRESS NOTE  Alex Doe, MD No address on file  DIAGNOSIS: Hypercoagulable state  Chronic abdominal pain  Pancreatitis  Dysuria - Plan: Urine culture, Urinalysis, Routine w reflex microscopic, Urine culture, Urinalysis, Routine w reflex microscopic  Chief Complaint  Patient presents with  . Thrombophilic syndrome  . Chronic abdominal pain    CURRENT THERAPY: Oral opioids, pancreatic enzymes, proton pump inhibitors, and Xarelto.  INTERVAL HISTORY: Alex Hoffman 53 y.o. 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 He has occasional activities over the holidays and has some increasing pain. Is also had urinary hesitancy with frequency and dysuria. He denies any worsening lower extremity swelling or redness, chest pain, PND, orthopnea, palpitations, epistaxis, melena, or hematochezia. Urine is darker in color. He denies any back pain, fever, night sweats, cough, or wheezing.  MEDICAL HISTORY: Past Medical History  Diagnosis Date  . Pulmonary embolism 12/2010  . Cystic thyroid nodule     left  . HTN (hypertension)   . Heterozygous factor V Leiden mutation 12/2010  . Pancreatitis 05/2011    EUS Dr Christella Hartigan 06/09/11-> dilated main pancreatic duct in HOP, 2-3 filling defects distal pancreatic duct, peripancreatic fluid resolved  . C. difficile colitis 01/2011?  Marland Kitchen Arthritis   . Clotting disorder   . GERD (gastroesophageal reflux disease)   . Chills   . Fever   . Weight loss   . Hearing loss   . Nasal congestion   . Leg swelling   . Palpitations   . Abdominal distension   . Abdominal pain   . Nausea & vomiting   . Rectal pain   . Difficulty urinating   . Arthritis pain   . Headache(784.0)   . Weakness   . Bruises easily   . Anemia   . Dizziness     . Numbness and tingling     both hands, and both legs and feet  . Joint pain   . Blood in urine     hx of, none current  . Frequent urination at night   . Pneumonia as child  . Abnormal EKG     hx left bundle branch block on ekg  . Sleep apnea     STOPBANG=5  . Heterozygous factor V Leiden mutation   . Prostatitis, chronic   . Pancreatitis   . Pancreatitis 12/2011  . Arthritis     lower back. Dr. Charlynne Cousins in [place]  . GERD (gastroesophageal reflux disease)     INTERIM HISTORY: has GERD (gastroesophageal reflux disease); Chronic abdominal pain; Dysphagia, unspecified; Rectal bleeding; Constipation; Pancreatitis; Abnormal LFTs; Hematuria, gross; Diarrhea; Hypokalemia; Cholecystitis; RUQ pain; Hypercoagulable state; and Pain in limb on his problem list.    ALLERGIES:  is allergic to reglan.  MEDICATIONS: has a current medication list which includes the following prescription(s): amlodipine, diazepam, esomeprazole, lubiprostone, methadone, oxycodone, pancrelipase (lip-prot-amyl), phenazopyridine, prochlorperazine, rivaroxaban, silodosin, tizanidine, zolpidem, cyclobenzaprine, and phenazopyridine.  SURGICAL HISTORY:  Past Surgical History  Procedure Laterality Date  . Neck surgery  1999    C5/C6 fusion, limited neck mobility especially turning to left  . Knee surgery  1980's    arthroscopy, left knee  . Femur fracture surgery  age 1    right leg  . Nerve surgery  1980's    right thumb  . Colonoscopy  01/2011    Dr. Zenon Mayo at [facility]-->normal  . Esophagogastroduodenoscopy  01/2011    Dr. Zenon Mayo at [facility]-->bile reflux in stomach-->patient c/o inadequate conscious sedation (Fentanyl 100mg /Versed 8mg )  . Ercp  07/2011    Dr. Logan Bores [facility]-> pancreatic enterotomy, pancreatic duct stones removed, bile duct not manipulated, no stents  . Cholecystectomy  01/25/2012    Procedure: LAPAROSCOPIC CHOLECYSTECTOMY WITH INTRAOPERATIVE CHOLANGIOGRAM;  Surgeon: Robyne Askew, MD;   Location: [facility];  Service: General;  Laterality: N/A;  . Cystoscopy with hydrodistension and biopsy  01/25/2012    Procedure: CYSTOSCOPY/BIOPSY/HYDRODISTENSION;  Surgeon: Anner Crete, MD;  Location: [facility];  Service: Urology;  Laterality: N/A;  Cystoscopy and Hydrodistension of the Bladder  . Colonoscopy with propofol  11/22/2012    WUJ:WJXBJYN anal rectum/fissure not identified although he certainly could have a chronic anal fissure     FAMILY HISTORY: family history includes Cancer in his maternal aunt and maternal grandfather; GI problems in his brother and another family member; Heart attack in his brother; Heart disease in his maternal uncle; Hypertension in his father and mother; Stroke in his brother. There is no history of Colon cancer or Liver disease.  SOCIAL HISTORY:  reports that he quit smoking about a year ago. His smoking use included Cigarettes. He has a 35 pack-year smoking history. He has never used smokeless tobacco. He reports that he does not drink alcohol or use illicit drugs.  REVIEW OF SYSTEMS:  Other than that discussed above is noncontributory.  PHYSICAL EXAMINATION: ECOG PERFORMANCE STATUS: 1 - Symptomatic but completely ambulatory  Blood pressure 130/73, pulse 63, temperature 97.2 F (36.2 C), temperature source Oral, resp. rate 20, weight 206 lb 9.6 oz (93.713 kg).  GENERAL:alert, no distress and comfortable SKIN: skin color, texture, turgor are normal, no rashes or significant lesions EYES: PERLA; Conjunctiva are pink and non-injected, sclera clear OROPHARYNX:no exudate, no erythema on lips, buccal mucosa, or tongue. NECK: supple, thyroid normal size, non-tender, without nodularity. No masses CHEST: Increased AP diameter with no breast masses. LYMPH:  no palpable lymphadenopathy in the cervical, axillary or inguinal LUNGS: clear to auscultation and percussion with normal breathing effort HEART: regular rate & rhythm and no murmurs. P2 is tolerable at the  apex. ABDOMEN:abdomen soft, non-tender and normal bowel sounds. No distention or rebound tenderness. MUSCULOSKELETAL:no cyanosis of digits and no clubbing. Range of motion normal. Bilateral lower extremity stasis changes with tenderness to mild palpation. Homans sign is negative. NEURO: alert & oriented x 3 with fluent speech, no focal motor/sensory deficits   LABORATORY DATA: Infusion on 12/02/2013  Component Date Value Range Status  . D-Dimer, Quant 12/02/2013 0.28  0.00 - 0.48 ug/mL-FEU Final   Comment:                                 AT THE INHOUSE ESTABLISHED CUTOFF                          VALUE OF 0.48 ug/mL FEU,                          THIS ASSAY HAS BEEN DOCUMENTED                          IN THE LITERATURE TO HAVE  A SENSITIVITY AND NEGATIVE                          PREDICTIVE VALUE OF AT LEAST                          98 TO 99%.  THE TEST RESULT                          SHOULD BE CORRELATED WITH                          AN ASSESSMENT OF THE CLINICAL                          PROBABILITY OF DVT / VTE.  Office Visit on 12/02/2013  Component Date Value Range Status  . Color, Urine 12/02/2013 YELLOW  YELLOW Final  . APPearance 12/02/2013 CLEAR  CLEAR Final  . Specific Gravity, Urine 12/02/2013 >1.030* 1.005 - 1.030 Final  . pH 12/02/2013 5.5  5.0 - 8.0 Final  . Glucose, UA 12/02/2013 NEGATIVE  NEGATIVE mg/dL Final  . Hgb urine dipstick 12/02/2013 NEGATIVE  NEGATIVE Final  . Bilirubin Urine 12/02/2013 NEGATIVE  NEGATIVE Final  . Ketones, ur 12/02/2013 NEGATIVE  NEGATIVE mg/dL Final  . Protein, ur 16/08/9603 NEGATIVE  NEGATIVE mg/dL Final  . Urobilinogen, UA 12/02/2013 0.2  0.0 - 1.0 mg/dL Final  . Nitrite 54/07/8118 NEGATIVE  NEGATIVE Final  . Leukocytes, UA 12/02/2013 NEGATIVE  NEGATIVE Final   MICROSCOPIC NOT DONE ON URINES WITH NEGATIVE PROTEIN, BLOOD, LEUKOCYTES, NITRITE, OR GLUCOSE <1000 mg/dL.  Office Visit on 11/04/2013  Component Date Value  Range Status  . D-Dimer, Quant 11/04/2013 <0.27  0.00 - 0.48 ug/mL-FEU Final   Comment:                                 AT THE INHOUSE ESTABLISHED CUTOFF                          VALUE OF 0.48 ug/mL FEU,                          THIS ASSAY HAS BEEN DOCUMENTED                          IN THE LITERATURE TO HAVE                          A SENSITIVITY AND NEGATIVE                          PREDICTIVE VALUE OF AT LEAST                          98 TO 99%.  THE TEST RESULT                          SHOULD BE CORRELATED WITH                          AN ASSESSMENT  OF THE CLINICAL                          PROBABILITY OF DVT / VTE.    PATHOLOGY: No new pathology.  Urinalysis    Component Value Date/Time   COLORURINE YELLOW 12/02/2013 1151   APPEARANCEUR CLEAR 12/02/2013 1151   LABSPEC >1.030* 12/02/2013 1151   PHURINE 5.5 12/02/2013 1151   GLUCOSEU NEGATIVE 12/02/2013 1151   HGBUR NEGATIVE 12/02/2013 1151   BILIRUBINUR NEGATIVE 12/02/2013 1151   KETONESUR NEGATIVE 12/02/2013 1151   PROTEINUR NEGATIVE 12/02/2013 1151   UROBILINOGEN 0.2 12/02/2013 1151   NITRITE NEGATIVE 12/02/2013 1151   LEUKOCYTESUR NEGATIVE 12/02/2013 1151    RADIOGRAPHIC STUDIES: No results found.  ASSESSMENT:  #1.Chronic abdominal pain from previous pancreatitis.  #2. Hypercoagulable state on long-term Xarelto.  #3. 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  #4. Chronic obstructive pulmonary disease #5. Dysuria and hesitancy, rule out urinary tract infection.   PLAN:  #1. Urine culture and sensitivity. #2. Pyridium 200 mg 3 times a day. #3. Antibiotics if urine cultures positive. #4. Continue methadone, oxycodone,, Xarelto with Dulcolax at bedtime. Continue pancreatic enzymes and Nexium. #5. Followup in 4 weeks.   All questions were answered. The patient knows to call the clinic with any problems, questions or concerns. We can certainly see the patient much sooner if necessary.   I spent 25  minutes counseling the patient face to face. The total time spent in the appointment was 30 minutes.    Maurilio LovelyFormanek, Jayden Rudge A, MD 12/02/2013 1:07 PM

## 2013-12-02 NOTE — Patient Instructions (Signed)
[facility] Discharge Instructions  RECOMMENDATIONS MADE BY THE CONSULTANT AND ANY TEST RESULTS WILL BE SENT TO YOUR REFERRING PHYSICIAN.  EXAM FINDINGS BY THE PHYSICIAN TODAY AND SIGNS OR SYMPTOMS TO REPORT TO CLINIC OR PRIMARY PHYSICIAN: Exam and findings as discussed by Dr. Zigmund DanielFormanek.  Will check urinalysis with culture today and if we need to put you on an antibiotic we will let you know.    MEDICATIONS PRESCRIBED:  Refills for oxycodone and methadone Pyridium - take as directed.  INSTRUCTIONS/FOLLOW-UP: Follow-up in 1 month.  Thank you for choosing [facility] to provide your oncology and hematology care.  To afford each patient quality time with our providers, please arrive at least 15 minutes before your scheduled appointment time.  With your help, our goal is to use those 15 minutes to complete the necessary work-up to ensure our physicians have the information they need to help with your evaluation and healthcare recommendations.    Effective January 1st, 2014, we ask that you re-schedule your appointment with our physicians should you arrive 10 or more minutes late for your appointment.  We strive to give you quality time with our providers, and arriving late affects you and other patients whose appointments are after yours.    Again, thank you for choosing [facility].  Our hope is that these requests will decrease the amount of time that you wait before being seen by our physicians.       _____________________________________________________________  Should you have questions after your visit to [facility], please contact our office at [phone] between the hours of 8:30 a.m. and 5:00 p.m.  Voicemails left after 4:30 p.m. will not be returned until the following business day.  For prescription refill requests, have your pharmacy contact our office with your prescription refill request.

## 2013-12-02 NOTE — Progress Notes (Signed)
Labs drawn today for dimer 

## 2013-12-03 LAB — URINE CULTURE
COLONY COUNT: NO GROWTH
Culture: NO GROWTH

## 2013-12-05 ENCOUNTER — Other Ambulatory Visit (HOSPITAL_COMMUNITY): Payer: Self-pay | Admitting: Oncology

## 2013-12-05 DIAGNOSIS — G4700 Insomnia, unspecified: Secondary | ICD-10-CM

## 2013-12-05 MED ORDER — ZOLPIDEM TARTRATE 5 MG PO TABS: 5.0000 mg | ORAL_TABLET | Freq: Every evening | ORAL | Status: DC | PRN

## 2013-12-10 NOTE — Progress Notes (Signed)
REVIEWED.  

## 2013-12-18 ENCOUNTER — Ambulatory Visit: Payer: Medicare Other | Admitting: Gastroenterology

## 2013-12-18 ENCOUNTER — Encounter: Payer: Self-pay | Admitting: Gastroenterology

## 2013-12-18 ENCOUNTER — Other Ambulatory Visit: Payer: Self-pay

## 2013-12-18 ENCOUNTER — Ambulatory Visit (INDEPENDENT_AMBULATORY_CARE_PROVIDER_SITE_OTHER): Payer: Medicare Other | Admitting: Gastroenterology

## 2013-12-18 VITALS — BP 154/83 | HR 79 | Temp 97.6°F | Ht 72.0 in | Wt 207.0 lb

## 2013-12-18 DIAGNOSIS — R109 Unspecified abdominal pain: Secondary | ICD-10-CM

## 2013-12-18 DIAGNOSIS — K5900 Constipation, unspecified: Secondary | ICD-10-CM

## 2013-12-18 DIAGNOSIS — K219 Gastro-esophageal reflux disease without esophagitis: Secondary | ICD-10-CM

## 2013-12-18 DIAGNOSIS — K859 Acute pancreatitis without necrosis or infection, unspecified: Secondary | ICD-10-CM

## 2013-12-18 DIAGNOSIS — R7989 Other specified abnormal findings of blood chemistry: Secondary | ICD-10-CM

## 2013-12-18 DIAGNOSIS — R1011 Right upper quadrant pain: Secondary | ICD-10-CM

## 2013-12-18 DIAGNOSIS — R945 Abnormal results of liver function studies: Secondary | ICD-10-CM

## 2013-12-18 DIAGNOSIS — G8929 Other chronic pain: Secondary | ICD-10-CM

## 2013-12-18 DIAGNOSIS — K625 Hemorrhage of anus and rectum: Secondary | ICD-10-CM

## 2013-12-18 MED ORDER — PANCRELIPASE (LIP-PROT-AMYL) 40000-136000 UNITS PO CPEP: ORAL_CAPSULE | ORAL | Status: DC

## 2013-12-18 MED ORDER — LIDOCAINE-HYDROCORTISONE ACE 3-0.5 % RE KIT
1.0000 | PACK | Freq: Two times a day (BID) | RECTAL | Status: DC
Start: 2013-12-18 — End: 2015-01-12

## 2013-12-18 NOTE — Assessment & Plan Note (Addendum)
53 year old gentleman with chronic lower, pain dating back to 2012. History of pancreatitis with pancreatic duct stone previously requiring ERCP. Chronic urological pain with prior procedures as well. Chronic low back pain and proctalgia with history of friable anal rectum last year but patient noncompliant with regimen at that time. Chronic pain being managed by his oncologist at this time. History of abnormal alkaline phosphatase, mild anemia. Discussed at length with patient. He continues to have periodic abdominal pain associated with nausea and by history scription debilitates him for about 3 days. Pain is predominantly in the right upper quadrant/right midabdomen when it occurs. With his next attack we'll check LFTs and lipase about 6-8 hours after the onset of symptoms. At this time go ahead and get another baseline LFTs and lipase as well as followup mild anemia with a CBC. He inquires about celiac disease, will check serologies to be thoroughly. We will increase his Zenpep to 40,000, 3 with meals and 2 with snacks. If he does not see a significant improvement in his upper abdominal discomfort, nausea, heartburn I would suggest an upper endoscopy for reevaluation. At this time he would like to wait and see how he does. He will let me know if he changes his mind. Otherwise we'll see him back in 3 months. He was urged to follow up with his urologist regarding ongoing issues there. He will continue his Amitiza and Nexium as before. Trial of AnaMantle HC for 2 weeks.

## 2013-12-18 NOTE — Patient Instructions (Signed)
1. Please increase Zenpep to 40,000 units, take three with meals and two with snacks. Samples and RX sent to your pharmacy. 2. Start Anamantle for your rectal pain. Twice per day for two weeks. 3. Please have your lab work done now. 4. Please have LFTs, lipase done 6-8 hours after next bout of abdominal pain. 5. Office visist in 3 months with Dr. Jena Gaussourk. 6. Call if you want to pursue an upper endoscopy as discussed. It will need to be done within 30 days of an office visit or we will have to see you again in the office prior to scheduling.

## 2013-12-18 NOTE — Progress Notes (Signed)
Primary Care Physician: [name], MD  Primary Gastroenterologist:  Roetta Sessions, MD   Chief Complaint  Patient presents with  . Follow-up    HPI: Alex Hoffman is a 53 y.o. male here for followup of chronic pancreatitis, GERD, fatty liver and mildly elevated alkaline phosphatase and constipation. He was last seen back in October 2014, at that time was doing reasonably well. He called back in December having some problems with recurrent abdominal pain, unfortunately he no showed for that appointment was made.  He had another attack of right upper quadrant pain about 4 weeks ago. Pain generally comes sporadically. He may go a couple months at a time without any right upper quadrant pain. When it occurs it lasts for several days. He basically lays in the bed the entire time. He becomes nauseated and has to consume clear liquids only. Remind him of his prior pancreatic attacks but doesn't get as severe. He has constant lower abdominal discomfort. Continues to have pain with urinating. States he was supposed to have another surgical procedure by Dr. Annabell Howells but he has not been back because of a pending bill. Continues to have rectal pain when he has a bowel movement. One year ago found to have friable anal rectum and prescribed Canasa suppositories. Patient states he does not do very well with suppositories and prefer a cream. He was noncompliant with the treatment. Nauseated a lot but well controlled on compazine 1/2 tablet TID. Without it would be have vomiting. BM every couple of days. Sometimes stools are hard. Rectal pain. But BM best than in the past. Takes Amitiza twice daily. Ran out of Zenpep for about one month. Complains of some breakthrough heartburn on Nexium twice a day. Denies any odynophagia. Has had problems swallowing his entire life. Last EGD at time of EUS in 2012 by Dr. Christella Hartigan, normal.  Current Outpatient Prescriptions  Medication Sig Dispense Refill  . amLODipine (NORVASC)  10 MG tablet Take 10 mg by mouth every morning.       . cyclobenzaprine (FLEXERIL) 10 MG tablet Take 1 tablet (10 mg total) by mouth 3 (three) times daily as needed for muscle spasms.  100 tablet  3  . diazepam (VALIUM) 5 MG tablet Take 1 tablet (5 mg total) by mouth every 6 (six) hours as needed for anxiety.  30 tablet  2  . esomeprazole (NEXIUM) 40 MG capsule Take 1 capsule (40 mg total) by mouth 2 (two) times daily. For indigestion  60 capsule  11  . lubiprostone (AMITIZA) 24 MCG capsule Take 1 capsule (24 mcg total) by mouth 2 (two) times daily with a meal.  60 capsule  3  . methadone (DOLOPHINE) 10 MG tablet Take 5 tablets (50 mg total) by mouth every 8 (eight) hours.  450 tablet  0  . oxycodone (ROXICODONE) 30 MG immediate release tablet Take 1 tablet (30 mg total) by mouth every 4 (four) hours as needed for pain.  100 tablet  0  . Pancrelipase, Lip-Prot-Amyl, (ZENPEP) 25000 UNITS CPEP Take 2 capsules by mouth 3 (three) times daily with meals. 1 with snack  240 capsule  11  . phenazopyridine (PYRIDIUM) 100 MG tablet Take 200 mg by mouth daily.       . phenazopyridine (PYRIDIUM) 200 MG tablet Take 1 tablet (200 mg total) by mouth 3 (three) times daily as needed for pain.  30 tablet  2  . prochlorperazine (COMPAZINE) 10 MG tablet Take 5 mg by mouth 3 (three) times daily.       Marland Kitchen  Rivaroxaban (XARELTO) 20 MG TABS tablet Take 1 tablet (20 mg total) by mouth daily with supper.  30 tablet  6  . silodosin (RAPAFLO) 8 MG CAPS capsule Take 8 mg by mouth daily with breakfast.       . tiZANidine (ZANAFLEX) 4 MG capsule Take 1 capsule (4 mg total) by mouth 3 (three) times daily.  100 capsule  3  . zolpidem (AMBIEN) 5 MG tablet Take 1 tablet (5 mg total) by mouth at bedtime as needed for sleep.  30 tablet  3   No current facility-administered medications for this visit.    Allergies as of 12/18/2013 - Review Complete 12/18/2013  Allergen Reaction Noted  . Reglan [metoclopramide]  08/09/2013     ROS:  General: Negative for anorexia, weight loss, fever, chills, fatigue, weakness. ENT: Negative for hoarseness, nasal congestion. See hpi. CV: Negative for chest pain, angina, palpitations, dyspnea on exertion, peripheral edema.  Respiratory: Negative for dyspnea at rest, dyspnea on exertion, cough, sputum, wheezing.  GI: See history of present illness. GU:  chronic for dysuria, urinary frequency, nocturnal urination.  Endo: Negative for unusual weight change.    Physical Examination:   BP 154/83  Pulse 79  Temp(Src) 97.6 F (36.4 C) (Oral)  Ht 6' (1.829 m)  Wt 207 lb (93.895 kg)  BMI 28.07 kg/m2  General: Well-nourished, well-developed in no acute distress.  Eyes: No icterus. Mouth: Oropharyngeal mucosa moist and pink , no lesions erythema or exudate. Lungs: Clear to auscultation bilaterally.  Heart: Regular rate and rhythm, no murmurs rubs or gallops.  Abdomen: Bowel sounds are normal, diffuse abdominal tenderness to even light palpation, nondistended, no hepatosplenomegaly or masses, no abdominal bruits or hernia , no rebound or guarding.   Extremities: No lower extremity edema. No clubbing or deformities. Neuro: Alert and oriented x 4   Skin: Warm and dry, no jaundice.   Psych: Alert and cooperative, normal mood and affect.  Labs:  Lab Results  Component Value Date   WBC 8.2 09/05/2013   HGB 13.1 09/05/2013   HCT 38.5* 09/05/2013   MCV 85.6 09/05/2013   PLT 280 09/05/2013   Lab Results  Component Value Date   ALT 17 08/06/2013   AST 22 08/06/2013   ALKPHOS 107 08/06/2013   BILITOT 0.7 08/06/2013   Lab Results  Component Value Date   CREATININE 0.85 07/22/2013   BUN 8 07/22/2013   NA 137 07/22/2013   K 3.7 07/22/2013   CL 100 07/22/2013   CO2 29 07/22/2013    Imaging Studies: No results found.

## 2013-12-19 NOTE — Progress Notes (Signed)
cc'd to pcp 

## 2013-12-30 ENCOUNTER — Encounter (HOSPITAL_COMMUNITY): Payer: Self-pay

## 2013-12-30 ENCOUNTER — Encounter (HOSPITAL_BASED_OUTPATIENT_CLINIC_OR_DEPARTMENT_OTHER): Payer: Medicare Other

## 2013-12-30 ENCOUNTER — Encounter (HOSPITAL_COMMUNITY): Payer: Medicare Other | Attending: Hematology and Oncology

## 2013-12-30 VITALS — BP 166/77 | HR 69 | Temp 97.3°F | Resp 20 | Wt 210.0 lb

## 2013-12-30 DIAGNOSIS — R319 Hematuria, unspecified: Secondary | ICD-10-CM | POA: Insufficient documentation

## 2013-12-30 DIAGNOSIS — K861 Other chronic pancreatitis: Secondary | ICD-10-CM

## 2013-12-30 DIAGNOSIS — D6859 Other primary thrombophilia: Secondary | ICD-10-CM | POA: Diagnosis not present

## 2013-12-30 DIAGNOSIS — G8929 Other chronic pain: Secondary | ICD-10-CM | POA: Insufficient documentation

## 2013-12-30 DIAGNOSIS — K859 Acute pancreatitis without necrosis or infection, unspecified: Secondary | ICD-10-CM

## 2013-12-30 DIAGNOSIS — R109 Unspecified abdominal pain: Secondary | ICD-10-CM | POA: Diagnosis not present

## 2013-12-30 DIAGNOSIS — J449 Chronic obstructive pulmonary disease, unspecified: Secondary | ICD-10-CM | POA: Diagnosis not present

## 2013-12-30 DIAGNOSIS — J4489 Other specified chronic obstructive pulmonary disease: Secondary | ICD-10-CM | POA: Insufficient documentation

## 2013-12-30 LAB — D-DIMER, QUANTITATIVE: D-Dimer, Quant: 0.36 ug/mL-FEU (ref 0.00–0.48)

## 2013-12-30 MED ORDER — OXYCODONE HCL 30 MG PO TABS: 30.0000 mg | ORAL_TABLET | ORAL | Status: DC | PRN

## 2013-12-30 MED ORDER — METHADONE HCL 10 MG PO TABS: 50.0000 mg | ORAL_TABLET | Freq: Three times a day (TID) | ORAL | Status: DC

## 2013-12-30 MED ORDER — RIVAROXABAN 15 MG PO TABS: 20.0000 mg | ORAL_TABLET | Freq: Every day | ORAL | Status: DC

## 2013-12-30 MED ORDER — RIVAROXABAN 20 MG PO TABS: 20.0000 mg | ORAL_TABLET | Freq: Every day | ORAL | Status: DC

## 2013-12-30 NOTE — Progress Notes (Signed)
Alex Hoffman presented for labwork. Labs per MD order drawn via Peripheral Line 23 gauge needle inserted in right AC.  Good blood return present. Procedure without incident.  Needle removed intact. Patient tolerated procedure well.   

## 2013-12-30 NOTE — Progress Notes (Signed)
[facility]  OFFICE PROGRESS NOTE  Alex DoeALABANZA,THOMAS, MD No address on file  DIAGNOSIS: Hypercoagulable state - Plan: CBC with Differential, D-dimer, quantitative  Chronic abdominal pain  Pancreatitis  Chief Complaint  Patient presents with  . Thrombophilia    Xarelto  . Chronic pain syndrome  . Fatigue    CURRENT THERAPY: Xarelto, methadone, oxycodone, oral pancreatic enzymes, proton pump inhibitor.  INTERVAL HISTORY: Alex PernaDavid L Hoffman 53 y.o. 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  He was denied coverage for his anticoagulant 4 days ago and since then has developed increasing lower 70 swelling with chest discomfort. Pain is in [place] as well. He denies any fever, night sweats, palpitations, PND, orthopnea, skin rash, headache, or seizures.  MEDICAL HISTORY: Past Medical History  Diagnosis Date  . Pulmonary embolism 12/2010  . Cystic thyroid nodule     left  . HTN (hypertension)   . Heterozygous factor V Leiden mutation 12/2010  . Pancreatitis 05/2011    [facility] Dr Alex HartiganJacobs 06/09/11-> dilated main pancreatic duct in HOP, 2-3 filling defects distal pancreatic duct, peripancreatic fluid resolved  . C. difficile colitis 01/2011?  Marland Kitchen. Arthritis   . Clotting disorder   . GERD (gastroesophageal reflux disease)   . Chills   . Fever   . Weight loss   . Hearing loss   . Nasal congestion   . Leg swelling   . Palpitations   . Abdominal distension   . Abdominal pain   . Nausea & vomiting   . Rectal pain   . Difficulty urinating   . Arthritis pain   . Headache(784.0)   . Weakness   . Bruises easily   . Anemia   . Dizziness   . Numbness and tingling     both hands, and both legs and feet  . Joint pain   . Blood in urine     hx of, none current  . Frequent urination at night   . Pneumonia as child  .  Abnormal EKG     hx left bundle branch block on ekg  . Sleep apnea     STOPBANG=5  . Heterozygous factor V Leiden mutation   . Prostatitis, chronic   . Pancreatitis   . Pancreatitis 12/2011  . Arthritis     lower back. Dr. [place]  . GERD (gastroesophageal reflux disease)     INTERIM HISTORY: has GERD (gastroesophageal reflux disease); Chronic abdominal pain; Dysphagia, unspecified; Rectal bleeding; Constipation; Pancreatitis; Abnormal LFTs; Hematuria, gross; Diarrhea; Hypokalemia; Cholecystitis; RUQ pain; Hypercoagulable state; and Pain in limb on his problem list.    ALLERGIES:  is allergic to reglan.  MEDICATIONS: has a current medication list which includes the following prescription(s): amlodipine, diazepam, esomeprazole, lubiprostone, methadone, oxycodone, pancrelipase (lip-prot-amyl), phenazopyridine, phenazopyridine, prochlorperazine, rivaroxaban, silodosin, tizanidine, zolpidem, cyclobenzaprine, lidocaine-hydrocortisone ace, and rivaroxaban.  SURGICAL HISTORY:  Past Surgical History  Procedure Laterality Date  . Neck surgery  1999    C5/C6 fusion, limited neck mobility especially turning to left  . Knee surgery  1980's    arthroscopy, left knee  . Femur fracture surgery  age 89    right leg  . Nerve surgery  1980's    right thumb  . Colonoscopy  01/2011    Dr. Zenon MayoBrian Hoffman at [facility]-->normal  . Esophagogastroduodenoscopy  01/2011    Dr. Arlys JohnBrian  Hoffman at [facility]-->bile reflux in stomach-->patient c/o inadequate conscious sedation (Fentanyl 100mg /Versed 8mg )  . Ercp  07/2011    Dr. Logan Bores [facility]-> pancreatic enterotomy, pancreatic duct stones removed, bile duct not manipulated, no stents  . Cholecystectomy  01/25/2012    Procedure: LAPAROSCOPIC CHOLECYSTECTOMY WITH INTRAOPERATIVE CHOLANGIOGRAM;  Surgeon: Alex Askew, MD;  Location: [facility];  Service: General;  Laterality: N/A;  . Cystoscopy with hydrodistension and biopsy  01/25/2012    Procedure:  CYSTOSCOPY/BIOPSY/HYDRODISTENSION;  Surgeon: Alex Crete, MD;  Location: [facility];  Service: Urology;  Laterality: N/A;  Cystoscopy and Hydrodistension of the Bladder  . 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 There is no history of Colon cancer or Liver disease.  SOCIAL HISTORY:  reports that he quit smoking about a year ago. His smoking use included Cigarettes. He has a 35 pack-year smoking history. He has never used smokeless tobacco. He reports that he does not drink alcohol or use illicit drugs.  REVIEW OF SYSTEMS:  Other than that discussed above is noncontributory.  PHYSICAL EXAMINATION: ECOG PERFORMANCE STATUS: 1 - Symptomatic but completely ambulatory  Blood pressure 166/77, pulse 69, temperature 97.3 F (36.3 C), temperature source Oral, resp. rate 20, weight 210 lb (95.255 kg).  GENERAL:alert, no distress and comfortable SKIN: skin color, texture, turgor are normal, no rashes or significant lesions EYES: PERLA; Conjunctiva are pink and non-injected, sclera clear OROPHARYNX:no exudate, no erythema on lips, buccal mucosa, or tongue. NECK: supple, thyroid normal size, non-tender, without nodularity. No masses CHEST: Increased AP diameter with no breast masses. LYMPH:  no palpable lymphadenopathy in the cervical, axillary or inguinal LUNGS: clear to auscultation and percussion with normal breathing effort HEART: regular rate & rhythm and no murmurs. ABDOMEN:abdomen soft, non-tender and normal bowel sounds. Soft with no organomegaly, ascites, or CVA tenderness. P2 is tolerable at the apex. MUSCULOSKELETAL:no  cyanosis of digits and no clubbing. Range of motion normal.. Bilateral lower extremity varicosities with tenderness over the thigh and femur bilaterally with negative Homans sign.  NEURO: alert & oriented x 3 with fluent speech, no focal motor/sensory deficits   LABORATORY DATA: Infusion on 12/30/2013  Component Date Value Ref Range Status  . D-Dimer, Quant 12/30/2013 0.36  0.00 - 0.48 ug/mL-FEU Final   Comment:                                 AT THE INHOUSE ESTABLISHED CUTOFF                          VALUE OF 0.48 ug/mL FEU,                          THIS ASSAY HAS BEEN DOCUMENTED                          IN THE LITERATURE TO HAVE  A SENSITIVITY AND NEGATIVE                          PREDICTIVE VALUE OF AT LEAST                          98 TO 99%.  THE TEST RESULT                          SHOULD BE CORRELATED WITH                          AN ASSESSMENT OF THE CLINICAL                          PROBABILITY OF DVT / VTE.  Infusion on 12/02/2013  Component Date Value Ref Range Status  . D-Dimer, Quant 12/02/2013 0.28  0.00 - 0.48 ug/mL-FEU Final   Comment:                                 AT THE INHOUSE ESTABLISHED CUTOFF                          VALUE OF 0.48 ug/mL FEU,                          THIS ASSAY HAS BEEN DOCUMENTED                          IN THE LITERATURE TO HAVE                          A SENSITIVITY AND NEGATIVE                          PREDICTIVE VALUE OF AT LEAST                          98 TO 99%.  THE TEST RESULT                          SHOULD BE CORRELATED WITH                          AN ASSESSMENT OF THE CLINICAL                          PROBABILITY OF DVT / VTE.  Office Visit on 12/02/2013  Component Date Value Ref Range Status  . Specimen Description 12/02/2013 URINE, CLEAN CATCH   Final  . Special Requests 12/02/2013 NONE   Final  . Culture  Setup Time 12/02/2013    Final                   Value:12/03/2013 00:46                          Performed at [facility]  . Colony Count 12/02/2013    Final  Value:NO GROWTH                         Performed at [facility]  . Culture 12/02/2013    Final                   Value:NO GROWTH                         Performed at [facility]  . Report Status 12/02/2013 12/03/2013 FINAL   Final  . Color, Urine 12/02/2013 YELLOW  YELLOW Final  . APPearance 12/02/2013 CLEAR  CLEAR Final  . Specific Gravity, Urine 12/02/2013 >1.030* 1.005 - 1.030 Final  . pH 12/02/2013 5.5  5.0 - 8.0 Final  . Glucose, UA 12/02/2013 NEGATIVE  NEGATIVE mg/dL Final  . Hgb urine dipstick 12/02/2013 NEGATIVE  NEGATIVE Final  . Bilirubin Urine 12/02/2013 NEGATIVE  NEGATIVE Final  . Ketones, ur 12/02/2013 NEGATIVE  NEGATIVE mg/dL Final  . Protein, ur 40/98/1191 NEGATIVE  NEGATIVE mg/dL Final  . Urobilinogen, UA 12/02/2013 0.2  0.0 - 1.0 mg/dL Final  . Nitrite 47/82/9562 NEGATIVE  NEGATIVE Final  . Leukocytes, UA 12/02/2013 NEGATIVE  NEGATIVE Final   MICROSCOPIC NOT DONE ON URINES WITH NEGATIVE PROTEIN, BLOOD, LEUKOCYTES, NITRITE, OR GLUCOSE <1000 mg/dL.    PATHOLOGY: No new pathology.  Urinalysis    Component Value Date/Time   COLORURINE YELLOW 12/02/2013 1151   APPEARANCEUR CLEAR 12/02/2013 1151   LABSPEC >1.030* 12/02/2013 1151   PHURINE 5.5 12/02/2013 1151   GLUCOSEU NEGATIVE 12/02/2013 1151   HGBUR NEGATIVE 12/02/2013 1151   BILIRUBINUR NEGATIVE 12/02/2013 1151   KETONESUR NEGATIVE 12/02/2013 1151   PROTEINUR NEGATIVE 12/02/2013 1151   UROBILINOGEN 0.2 12/02/2013 1151   NITRITE NEGATIVE 12/02/2013 1151   LEUKOCYTESUR NEGATIVE 12/02/2013 1151    RADIOGRAPHIC STUDIES: No results found.  ASSESSMENT:  #1. Acute insurance failure. #2. Worsening lower extremity pain with postphlebitic syndrome secondary to discontinuation of anticoagulant. #3. Factor V Leiden mutation, critical to remain on lifelong Xarelto. #4. Chronic obstructive pulmonary disease, no longer smoking. #5.  Chronic abdominal pain from previous pancreatitis.   PLAN:  #1. Continue methadone 50 mg every 8 hours along with oxycodone 30 mg up to every 4 hours as needed for pain. #2. Letter to [employer] pharmacy services to reinstitute Xarelto therapy which will need to be free started at a loading dose for 3 weeks at 15 mg twice a day followed by 20 mg daily for ever. #3. Followup in 4 weeks with CBC and d-dimer.   All questions were answered. The patient knows to call the clinic with any problems, questions or concerns. We can certainly see the patient much sooner if necessary.   I spent 25 minutes counseling the patient face to face. The total time spent in the appointment was 30 minutes.    Maurilio Lovely, MD 12/30/2013 12:55 PM

## 2013-12-30 NOTE — Patient Instructions (Signed)
[facility] Discharge Instructions  RECOMMENDATIONS MADE BY THE CONSULTANT AND ANY TEST RESULTS WILL BE SENT TO YOUR REFERRING PHYSICIAN.  EXAM FINDINGS BY THE PHYSICIAN TODAY AND SIGNS OR SYMPTOMS TO REPORT TO CLINIC OR PRIMARY PHYSICIAN: Exam and findings as discussed by Dr. Zigmund DanielFormanek.  MEDICATIONS PRESCRIBED:   Xarelto loading dose (20mg ) and Xarelto 15 mg; methadone and oxycodone  INSTRUCTIONS/FOLLOW-UP: Return in one month for blood work and follow-up visit.  Thank you for choosing [facility] to provide your oncology and hematology care.  To afford each patient quality time with our providers, please arrive at least 15 minutes before your scheduled appointment time.  With your help, our goal is to use those 15 minutes to complete the necessary work-up to ensure our physicians have the information they need to help with your evaluation and healthcare recommendations.    Effective January 1st, 2014, we ask that you re-schedule your appointment with our physicians should you arrive 10 or more minutes late for your appointment.  We strive to give you quality time with our providers, and arriving late affects you and other patients whose appointments are after yours.    Again, thank you for choosing [facility].  Our hope is that these requests will decrease the amount of time that you wait before being seen by our physicians.       _____________________________________________________________  Should you have questions after your visit to [facility], please contact our office at [phone] between the hours of 8:30 a.m. and 5:00 p.m.  Voicemails left after 4:30 p.m. will not be returned until the following business day.  For prescription refill requests, have your pharmacy contact our office with your prescription refill request.

## 2014-01-13 DIAGNOSIS — K219 Gastro-esophageal reflux disease without esophagitis: Secondary | ICD-10-CM | POA: Diagnosis not present

## 2014-01-13 DIAGNOSIS — K5900 Constipation, unspecified: Secondary | ICD-10-CM | POA: Diagnosis not present

## 2014-01-13 DIAGNOSIS — R1011 Right upper quadrant pain: Secondary | ICD-10-CM | POA: Diagnosis not present

## 2014-01-13 DIAGNOSIS — R7989 Other specified abnormal findings of blood chemistry: Secondary | ICD-10-CM | POA: Diagnosis not present

## 2014-01-13 DIAGNOSIS — K859 Acute pancreatitis without necrosis or infection, unspecified: Secondary | ICD-10-CM | POA: Diagnosis not present

## 2014-01-13 DIAGNOSIS — K625 Hemorrhage of anus and rectum: Secondary | ICD-10-CM | POA: Diagnosis not present

## 2014-01-13 DIAGNOSIS — R109 Unspecified abdominal pain: Secondary | ICD-10-CM | POA: Diagnosis not present

## 2014-01-13 DIAGNOSIS — G8929 Other chronic pain: Secondary | ICD-10-CM | POA: Diagnosis not present

## 2014-01-13 LAB — CBC WITH DIFFERENTIAL/PLATELET
BASOS ABS: 0 10*3/uL (ref 0.0–0.1)
Basophils Relative: 0 % (ref 0–1)
EOS PCT: 2 % (ref 0–5)
Eosinophils Absolute: 0.3 10*3/uL (ref 0.0–0.7)
HCT: 35.4 % — ABNORMAL LOW (ref 39.0–52.0)
HEMOGLOBIN: 12.3 g/dL — AB (ref 13.0–17.0)
Lymphocytes Relative: 22 % (ref 12–46)
Lymphs Abs: 2.8 10*3/uL (ref 0.7–4.0)
MCH: 27.9 pg (ref 26.0–34.0)
MCHC: 34.7 g/dL (ref 30.0–36.0)
MCV: 80.3 fL (ref 78.0–100.0)
Monocytes Absolute: 0.9 10*3/uL (ref 0.1–1.0)
Monocytes Relative: 7 % (ref 3–12)
Neutro Abs: 8.8 10*3/uL — ABNORMAL HIGH (ref 1.7–7.7)
Neutrophils Relative %: 69 % (ref 43–77)
Platelets: 341 10*3/uL (ref 150–400)
RBC: 4.41 MIL/uL (ref 4.22–5.81)
RDW: 13.5 % (ref 11.5–15.5)
WBC: 12.8 10*3/uL — ABNORMAL HIGH (ref 4.0–10.5)

## 2014-01-13 LAB — HEPATIC FUNCTION PANEL
ALK PHOS: 105 U/L (ref 39–117)
ALT: 25 U/L (ref 0–53)
AST: 22 U/L (ref 0–37)
Albumin: 3.9 g/dL (ref 3.5–5.2)
BILIRUBIN DIRECT: 0.2 mg/dL (ref 0.0–0.3)
BILIRUBIN INDIRECT: 0.8 mg/dL (ref 0.2–1.2)
TOTAL PROTEIN: 6.8 g/dL (ref 6.0–8.3)
Total Bilirubin: 1 mg/dL (ref 0.2–1.2)

## 2014-01-13 LAB — LIPASE: Lipase: <10 U/L (ref 0–75)

## 2014-01-14 LAB — TISSUE TRANSGLUTAMINASE, IGA: Tissue Transglutaminase Ab, IgA: 4.5 U/mL (ref <20)

## 2014-01-14 LAB — IGA: IgA: 293 mg/dL (ref 68–379)

## 2014-01-16 ENCOUNTER — Telehealth: Payer: Self-pay | Admitting: General Practice

## 2014-01-16 NOTE — Progress Notes (Signed)
Quick Note:  Labs ok. Mild anemia, stable, likely anemia chronic disease. Mild elevation in wbc.  How is he doing? See separate telephone note from 01/16/14. ______

## 2014-01-16 NOTE — Telephone Encounter (Signed)
Patient is noncompliant.  Ordered labs day of OV 12/18/13. Did not have labs done until 01/13/14.   See assessment and plan for 12/18/13: patient declined EGD, wanted to try change in regimen before deciding.  Raynelle FanningJulie, please find out what patient's concerns are: abdominal pain, refractory gerd, etc.  Did he follow plan as outlined day of OV?

## 2014-01-16 NOTE — Telephone Encounter (Signed)
Patient wanted results from his lab work and he also wanted to know if he still needed to have a upper Endoscopy done.  I told him I would follow-up with Verlon AuLeslie concerning the upper Endoscopy, however I explained his lab results were not ready and our policy is 5-7 business days to communicate results.  Leslie-please advise on upper Endoscopy?

## 2014-01-17 ENCOUNTER — Encounter: Payer: Self-pay | Admitting: Gastroenterology

## 2014-01-17 NOTE — Telephone Encounter (Signed)
Per my chart:  Dear Leanna BattlesLeslie S. Lewis, PA-C   I would like to go ahead with the procedure, I believe it's called an endoscopy. I do want to make sure that I'm fully asleep while this procedure is being done. Dr. Kendell Baneourke has done it before at the [facility] [place] on a One-Day Surgery Out-Patient basis. Thank You and all of your team for all of the help and understanding you provide.   Sincerely, DEvans [phone]

## 2014-01-20 NOTE — Telephone Encounter (Signed)
Patient will need an ov prior to scheduling his EGD.

## 2014-01-20 NOTE — Telephone Encounter (Signed)
Pt has an appointment on April 21 @ 1130 with LSL and he is aware. I will also send out a letter to remind him.

## 2014-01-21 ENCOUNTER — Other Ambulatory Visit: Payer: Self-pay

## 2014-01-21 DIAGNOSIS — R945 Abnormal results of liver function studies: Secondary | ICD-10-CM

## 2014-01-21 DIAGNOSIS — R7989 Other specified abnormal findings of blood chemistry: Secondary | ICD-10-CM

## 2014-01-21 NOTE — Telephone Encounter (Signed)
OK to offer urgent OV spot next week.

## 2014-01-27 NOTE — Telephone Encounter (Signed)
Pt is aware of OV on 4/1 at 3 with LSL and to cancel OV for 4/21 at 1130 with LSL

## 2014-01-27 NOTE — Telephone Encounter (Signed)
Darl PikesSusan will you see if he wants to come in this week.

## 2014-01-28 ENCOUNTER — Other Ambulatory Visit (HOSPITAL_COMMUNITY): Payer: Self-pay | Admitting: Oncology

## 2014-01-28 ENCOUNTER — Encounter (HOSPITAL_BASED_OUTPATIENT_CLINIC_OR_DEPARTMENT_OTHER): Payer: Medicare Other

## 2014-01-28 ENCOUNTER — Encounter (HOSPITAL_COMMUNITY): Payer: Self-pay

## 2014-01-28 ENCOUNTER — Encounter (HOSPITAL_COMMUNITY): Payer: Self-pay | Admitting: Oncology

## 2014-01-28 ENCOUNTER — Telehealth (HOSPITAL_COMMUNITY): Payer: Self-pay

## 2014-01-28 ENCOUNTER — Encounter (HOSPITAL_COMMUNITY): Payer: Medicare Other

## 2014-01-28 VITALS — BP 158/76 | HR 75 | Temp 97.9°F | Resp 16 | Wt 208.0 lb

## 2014-01-28 DIAGNOSIS — G8929 Other chronic pain: Secondary | ICD-10-CM | POA: Diagnosis not present

## 2014-01-28 DIAGNOSIS — I10 Essential (primary) hypertension: Secondary | ICD-10-CM

## 2014-01-28 DIAGNOSIS — R319 Hematuria, unspecified: Secondary | ICD-10-CM

## 2014-01-28 DIAGNOSIS — D6859 Other primary thrombophilia: Secondary | ICD-10-CM

## 2014-01-28 DIAGNOSIS — N400 Enlarged prostate without lower urinary tract symptoms: Secondary | ICD-10-CM

## 2014-01-28 DIAGNOSIS — R109 Unspecified abdominal pain: Secondary | ICD-10-CM | POA: Diagnosis not present

## 2014-01-28 DIAGNOSIS — J449 Chronic obstructive pulmonary disease, unspecified: Secondary | ICD-10-CM

## 2014-01-28 HISTORY — DX: Benign prostatic hyperplasia without lower urinary tract symptoms: N40.0

## 2014-01-28 HISTORY — DX: Essential (primary) hypertension: I10

## 2014-01-28 LAB — URINALYSIS, ROUTINE W REFLEX MICROSCOPIC
BILIRUBIN URINE: NEGATIVE
Glucose, UA: NEGATIVE mg/dL
Hgb urine dipstick: NEGATIVE
Ketones, ur: NEGATIVE mg/dL
Leukocytes, UA: NEGATIVE
Nitrite: NEGATIVE
PH: 5.5 (ref 5.0–8.0)
Protein, ur: NEGATIVE mg/dL
UROBILINOGEN UA: 0.2 mg/dL (ref 0.0–1.0)

## 2014-01-28 LAB — CBC WITH DIFFERENTIAL/PLATELET
BASOS ABS: 0 10*3/uL (ref 0.0–0.1)
Basophils Relative: 1 % (ref 0–1)
Eosinophils Absolute: 0.2 10*3/uL (ref 0.0–0.7)
Eosinophils Relative: 3 % (ref 0–5)
HEMATOCRIT: 38.4 % — AB (ref 39.0–52.0)
Hemoglobin: 13 g/dL (ref 13.0–17.0)
LYMPHS PCT: 48 % — AB (ref 12–46)
Lymphs Abs: 3.7 10*3/uL (ref 0.7–4.0)
MCH: 28.7 pg (ref 26.0–34.0)
MCHC: 33.9 g/dL (ref 30.0–36.0)
MCV: 84.8 fL (ref 78.0–100.0)
MONO ABS: 0.5 10*3/uL (ref 0.1–1.0)
MONOS PCT: 7 % (ref 3–12)
Neutro Abs: 3.1 10*3/uL (ref 1.7–7.7)
Neutrophils Relative %: 41 % — ABNORMAL LOW (ref 43–77)
Platelets: 376 10*3/uL (ref 150–400)
RBC: 4.53 MIL/uL (ref 4.22–5.81)
RDW: 12.9 % (ref 11.5–15.5)
WBC: 7.6 10*3/uL (ref 4.0–10.5)

## 2014-01-28 LAB — D-DIMER, QUANTITATIVE: D-Dimer, Quant: 0.29 ug/mL-FEU (ref 0.00–0.48)

## 2014-01-28 LAB — LIPASE, BLOOD: LIPASE: 15 U/L (ref 11–59)

## 2014-01-28 MED ORDER — TIZANIDINE HCL 4 MG PO CAPS: ORAL_CAPSULE | ORAL | Status: DC

## 2014-01-28 MED ORDER — SILODOSIN 8 MG PO CAPS: 8.0000 mg | ORAL_CAPSULE | Freq: Every day | ORAL | Status: DC

## 2014-01-28 MED ORDER — METHADONE HCL 10 MG PO TABS: 50.0000 mg | ORAL_TABLET | Freq: Three times a day (TID) | ORAL | Status: DC

## 2014-01-28 MED ORDER — RIVAROXABAN 20 MG PO TABS: 20.0000 mg | ORAL_TABLET | Freq: Every day | ORAL | Status: DC

## 2014-01-28 MED ORDER — AMLODIPINE BESYLATE 10 MG PO TABS: 10.0000 mg | ORAL_TABLET | ORAL | Status: DC

## 2014-01-28 MED ORDER — OXYCODONE HCL 30 MG PO TABS: 30.0000 mg | ORAL_TABLET | ORAL | Status: DC | PRN

## 2014-01-28 NOTE — Patient Instructions (Addendum)
[facility] Discharge Instructions  RECOMMENDATIONS MADE BY THE CONSULTANT AND ANY TEST RESULTS WILL BE SENT TO YOUR REFERRING PHYSICIAN.  EXAM FINDINGS BY THE PHYSICIAN TODAY AND SIGNS OR SYMPTOMS TO REPORT TO CLINIC OR PRIMARY PHYSICIAN: Exam and findings as discussed by Dr. Zigmund DanielFormanek. Will check labs today and will make referral to Dr. Annabell HowellsWrenn.  MEDICATIONS PRESCRIBED:  Refills for Methadone, oxycodone, and zanaflex.  INSTRUCTIONS/FOLLOW-UP: Follow-up in 1 month with labs and office visit.  Thank you for choosing [facility] to provide your oncology and hematology care.  To afford each patient quality time with our providers, please arrive at least 15 minutes before your scheduled appointment time.  With your help, our goal is to use those 15 minutes to complete the necessary work-up to ensure our physicians have the information they need to help with your evaluation and healthcare recommendations.    Effective January 1st, 2014, we ask that you re-schedule your appointment with our physicians should you arrive 10 or more minutes late for your appointment.  We strive to give you quality time with our providers, and arriving late affects you and other patients whose appointments are after yours.    Again, thank you for choosing [facility].  Our hope is that these requests will decrease the amount of time that you wait before being seen by our physicians.       _____________________________________________________________  Should you have questions after your visit to [facility], please contact our office at [phone] between the hours of 8:30 a.m. and 5:00 p.m.  Voicemails left after 4:30 p.m. will not be returned until the following business day.  For prescription refill requests, have your pharmacy contact our office with your prescription refill request.

## 2014-01-28 NOTE — Progress Notes (Signed)
Alex Hoffman presented for labwork. Labs per MD order drawn via Peripheral Line 23 gauge needle inserted in Right lateral AC  Good blood return present. Procedure without incident.  Needle removed intact. Patient tolerated procedure well.

## 2014-01-28 NOTE — Telephone Encounter (Signed)
Patient notified that he needs to contact Dawn at Dr. Belva CromeWrenn's office prior to their making an appointment.  Also notified that PFTs are scheduled for 4/22 at 11 am.  Verbalized understanding of instructions.

## 2014-01-28 NOTE — Progress Notes (Signed)
[facility]  OFFICE PROGRESS NOTE  Alex Doe, MD No address on file  DIAGNOSIS: Hypercoagulable state - Plan: Lipase, blood, Lipase, blood  Chronic abdominal pain - Plan: Lipase, blood, Lipase, blood  Hematuria - Plan: Urinalysis, Routine w reflex microscopic, Urinalysis, Routine w reflex microscopic, Lipase, blood, Lipase, blood  Chronic obstructive pulmonary disease - Plan: Pulmonary function test, Lipase, blood, Lipase, blood  Chief Complaint  Patient presents with  . Hypercoagulable state  . Abdominal Pain    CURRENT THERAPY: Methadone, oxycodone, Xarelto, oral pancreatic enzymes, Nexium.   INTERVAL HISTORY: Alex Hoffman 53 y.o. male returns for followup and continuation of daily to therapy of hypercoagulable state in conjunction with postphlebitic syndrome and chronic abdominal pain from previous episodes of pancreatitis.  He has suffered with occasional bouts of hematuria associated with abdominal distention and back pain with spasms. He denies any fever night sweats but has been much more short of breath on mild exertion including climbing 45 stairs. He denies any PND, orthopnea, or palpitations. He denies any worsening lower extremity swelling, skin rash, headache, epistaxis, melena, hematochezia, and is a chronic dental problems for which he is seeking intervention in the near future. He has an appointment with gastroenterologist tomorrow to schedule an upper endoscopy. This will include biopsy for celiac disease and patient was told to discontinue Xarelto for 24 hours prior to the endoscopy.  MEDICAL HISTORY: Past Medical History  Diagnosis Date  . Pulmonary embolism 12/2010  . Cystic thyroid nodule     left  . HTN (hypertension)   . Heterozygous factor V Leiden mutation 12/2010  . Pancreatitis 05/2011    EUS Dr Christella Hartigan 06/09/11-> dilated main pancreatic duct in HOP, 2-3 filling defects distal pancreatic duct, peripancreatic  fluid resolved  . C. difficile colitis 01/2011?  Marland Kitchen Arthritis   . Clotting disorder   . GERD (gastroesophageal reflux disease)   . Chills   . Fever   . Weight loss   . Hearing loss   . Nasal congestion   . Leg swelling   . Palpitations   . Abdominal distension   . Abdominal pain   . Nausea & vomiting   . Rectal pain   . Difficulty urinating   . Arthritis pain   . Headache(784.0)   . Weakness   . Bruises easily   . Anemia   . Dizziness   . Numbness and tingling     both hands, and both legs and feet  . Joint pain   . Blood in urine     hx of, none current  . Frequent urination at night   . Pneumonia as child  . Abnormal EKG     hx left bundle branch block on ekg  . Sleep apnea     STOPBANG=5  . Heterozygous factor V Leiden mutation   . Prostatitis, chronic   . Pancreatitis   . Pancreatitis 12/2011  . Arthritis     lower back. Dr. Charlynne Cousins in [place]  . GERD (gastroesophageal reflux disease)   . HTN (hypertension) 01/28/2014  . BPH (benign prostatic hyperplasia) 01/28/2014    INTERIM HISTORY: has GERD (gastroesophageal reflux disease); Chronic abdominal pain; Dysphagia, unspecified; Rectal bleeding; Constipation; Pancreatitis; Abnormal LFTs; Hematuria, gross; Diarrhea; Hypokalemia; Cholecystitis; RUQ pain; Hypercoagulable state; Pain in limb; HTN (hypertension); and BPH (benign prostatic hyperplasia) on his problem list.    ALLERGIES:  is allergic to reglan.  MEDICATIONS: has a current medication list which  includes the following prescription(s): diazepam, esomeprazole, lidocaine-hydrocortisone ace, methadone, oxycodone, pancrelipase (lip-prot-amyl), prochlorperazine, tizanidine, zolpidem, amlodipine, cyclobenzaprine, lubiprostone, phenazopyridine, phenazopyridine, rivaroxaban, and silodosin.  SURGICAL HISTORY:  Past Surgical History  Procedure Laterality Date  . Neck surgery  1999    C5/C6 fusion, limited neck mobility especially turning to left  . Knee surgery   1980's    arthroscopy, left knee  . Femur fracture surgery  age 58    right leg  . Nerve surgery  1980's    right thumb  . Colonoscopy  01/2011    Dr. Zenon Mayo at [facility]-->normal  . Esophagogastroduodenoscopy  01/2011    Dr. Zenon Mayo at [facility]-->bile reflux in stomach-->patient c/o inadequate conscious sedation (Fentanyl 100mg /Versed 8mg )  . Ercp  07/2011    Dr. Logan Bores [facility]-> pancreatic enterotomy, pancreatic duct stones removed, bile duct not manipulated, no stents  . Cholecystectomy  01/25/2012    Procedure: LAPAROSCOPIC CHOLECYSTECTOMY WITH INTRAOPERATIVE CHOLANGIOGRAM;  Surgeon: Robyne Askew, MD;  Location: [facility];  Service: General;  Laterality: N/A;  . Cystoscopy with hydrodistension and biopsy  01/25/2012    Procedure: CYSTOSCOPY/BIOPSY/HYDRODISTENSION;  Surgeon: Anner Crete, MD;  Location: [facility];  Service: Urology;  Laterality: N/A;  Cystoscopy and Hydrodistension of the Bladder  . Colonoscopy with propofol  11/22/2012    YQM:VHQIONG anal rectum/fissure not identified although he certainly could have a chronic anal fissure     FAMILY HISTORY: family history includes Cancer in his maternal aunt and maternal grandfather; GI problems in his brother and another family member; Heart attack in his brother; Heart disease in his maternal uncle; Hypertension in his father and mother; Stroke in his brother. There is no history of Colon cancer or Liver disease.  SOCIAL HISTORY:  reports that he quit smoking about 13 months ago. His smoking use included Cigarettes. He has a 35 pack-year smoking history. He has never used smokeless tobacco. He reports that he does not drink alcohol or use illicit drugs.  REVIEW OF SYSTEMS:  Other than that discussed above is noncontributory.  PHYSICAL EXAMINATION: ECOG PERFORMANCE STATUS: 2 - Symptomatic, <50% confined to bed  Blood pressure 158/76, pulse 75, temperature 97.9 F (36.6 C), temperature source Oral, resp. rate 16, weight 208 lb (94.348  kg).  GENERAL:alert, no distress and comfortable SKIN: skin color, texture, turgor are normal, no rashes or significant lesions EYES: PERLA; Conjunctiva are pink and non-injected, sclera clear SINUSES: No redness or tenderness over maxillary or ethmoid sinuses OROPHARYNX:no exudate, no erythema on lips, buccal mucosa, or tongue. NECK: supple, thyroid normal size, non-tender, without nodularity. No masses CHEST: Increased AP diameter with no breast masses. LYMPH:  no palpable lymphadenopathy in the cervical, axillary or inguinal LUNGS: clear to auscultation and percussion with normal breathing effort HEART: regular rate & rhythm and no murmurs. ABDOMEN:abdomen soft, non-tender and normal bowel sounds. Distended with severe guarding without rebound. MUSCULOSKELETAL:no cyanosis of digits and no clubbing. Range of motion normal. Bilateral lower extremity varicosities with negative Homans sign. NEURO: alert & oriented x 3 with fluent speech, no focal motor/sensory deficits   LABORATORY DATA: Appointment on 01/28/2014  Component Date Value Ref Range Status  . WBC 01/28/2014 7.6  4.0 - 10.5 K/uL Final  . RBC 01/28/2014 4.53  4.22 - 5.81 MIL/uL Final  . Hemoglobin 01/28/2014 13.0  13.0 - 17.0 g/dL Final  . HCT 29/52/8413 38.4* 39.0 - 52.0 % Final  . MCV 01/28/2014 84.8  78.0 - 100.0 fL Final  . MCH 01/28/2014 28.7  26.0 - 34.0 pg Final  . MCHC 01/28/2014 33.9  30.0 - 36.0 g/dL Final  . RDW 16/08/9603 12.9  11.5 - 15.5 % Final  . Platelets 01/28/2014 376  150 - 400 K/uL Final  . Neutrophils Relative % 01/28/2014 41* 43 - 77 % Final  . Neutro Abs 01/28/2014 3.1  1.7 - 7.7 K/uL Final  . Lymphocytes Relative 01/28/2014 48* 12 - 46 % Final  . Lymphs Abs 01/28/2014 3.7  0.7 - 4.0 K/uL Final  . Monocytes Relative 01/28/2014 7  3 - 12 % Final  . Monocytes Absolute 01/28/2014 0.5  0.1 - 1.0 K/uL Final  . Eosinophils Relative 01/28/2014 3  0 - 5 % Final  . Eosinophils Absolute 01/28/2014 0.2  0.0 -  0.7 K/uL Final  . Basophils Relative 01/28/2014 1  0 - 1 % Final  . Basophils Absolute 01/28/2014 0.0  0.0 - 0.1 K/uL Final  . D-Dimer, Quant 01/28/2014 0.29  0.00 - 0.48 ug/mL-FEU Final   Comment:                                 AT THE INHOUSE ESTABLISHED CUTOFF                          VALUE OF 0.48 ug/mL FEU,                          THIS ASSAY HAS BEEN DOCUMENTED                          IN THE LITERATURE TO HAVE                          A SENSITIVITY AND NEGATIVE                          PREDICTIVE VALUE OF AT LEAST                          98 TO 99%.  THE TEST RESULT                          SHOULD BE CORRELATED WITH                          AN ASSESSMENT OF THE CLINICAL                          PROBABILITY OF DVT / VTE.  Office Visit on 01/28/2014  Component Date Value Ref Range Status  . Color, Urine 01/28/2014 YELLOW  YELLOW Final  . APPearance 01/28/2014 CLEAR  CLEAR Final  . Specific Gravity, Urine 01/28/2014 >1.030* 1.005 - 1.030 Final  . pH 01/28/2014 5.5  5.0 - 8.0 Final  . Glucose, UA 01/28/2014 NEGATIVE  NEGATIVE mg/dL Final  . Hgb urine dipstick 01/28/2014 NEGATIVE  NEGATIVE Final  . Bilirubin Urine 01/28/2014 NEGATIVE  NEGATIVE Final  . Ketones, ur 01/28/2014 NEGATIVE  NEGATIVE mg/dL Final  . Protein, ur 54/07/8118 NEGATIVE  NEGATIVE mg/dL Final  . Urobilinogen, UA 01/28/2014 0.2  0.0 - 1.0 mg/dL Final  . Nitrite 14/78/2956 NEGATIVE  NEGATIVE Final  .  Leukocytes, UA 01/28/2014 NEGATIVE  NEGATIVE Final   MICROSCOPIC NOT DONE ON URINES WITH NEGATIVE PROTEIN, BLOOD, LEUKOCYTES, NITRITE, OR GLUCOSE <1000 mg/dL.  . Lipase 01/28/2014 15  11 - 59 U/L Final  Infusion on 12/30/2013  Component Date Value Ref Range Status  . D-Dimer, Quant 12/30/2013 0.36  0.00 - 0.48 ug/mL-FEU Final   Comment:                                 AT THE INHOUSE ESTABLISHED CUTOFF                          VALUE OF 0.48 ug/mL FEU,                          THIS ASSAY HAS BEEN DOCUMENTED                           IN THE LITERATURE TO HAVE                          A SENSITIVITY AND NEGATIVE                          PREDICTIVE VALUE OF AT LEAST                          98 TO 99%.  THE TEST RESULT                          SHOULD BE CORRELATED WITH                          AN ASSESSMENT OF THE CLINICAL                          PROBABILITY OF DVT / VTE.    PATHOLOGY: No new pathology.  Urinalysis    Component Value Date/Time   COLORURINE YELLOW 01/28/2014 1504   APPEARANCEUR CLEAR 01/28/2014 1504   LABSPEC >1.030* 01/28/2014 1504   PHURINE 5.5 01/28/2014 1504   GLUCOSEU NEGATIVE 01/28/2014 1504   HGBUR NEGATIVE 01/28/2014 1504   BILIRUBINUR NEGATIVE 01/28/2014 1504   KETONESUR NEGATIVE 01/28/2014 1504   PROTEINUR NEGATIVE 01/28/2014 1504   UROBILINOGEN 0.2 01/28/2014 1504   NITRITE NEGATIVE 01/28/2014 1504   LEUKOCYTESUR NEGATIVE 01/28/2014 1504    RADIOGRAPHIC STUDIES: No results found.  ASSESSMENT:  #1. Chronic abdominal pain from previous bouts of pancreatitis with bilateral postphlebitic lower extremity pain. #2. Factor V Leiden mutation on lifelong Xarelto. #3. Chronic obstructive pulmonary disease, no longer smoking. #4. Chronic abdominal pain from previous pancreatitis episodes #5. Recurrent hematuria.   PLAN:  #1. Referral to Dr. Annabell HowellsWrenn who he has seen in the past. #2. Continue methadone, oxycodone, Xarelto, Zanaflex, pancreatic enzymes, and Nexium. #3. Pulmonary function tests. #4. Followup in 4 weeks. CBC, d-dimer, lipase at that time.   All questions were answered. The patient knows to call the clinic with any problems, questions or concerns. We can certainly see the patient much sooner if necessary.   I spent 30 minutes counseling the patient face to face. The total time spent in the appointment was 40  minutes.    Maurilio Lovely, MD 01/29/2014 6:44 AM

## 2014-01-29 ENCOUNTER — Other Ambulatory Visit: Payer: Self-pay | Admitting: Internal Medicine

## 2014-01-29 ENCOUNTER — Encounter: Payer: Self-pay | Admitting: Gastroenterology

## 2014-01-29 ENCOUNTER — Ambulatory Visit (INDEPENDENT_AMBULATORY_CARE_PROVIDER_SITE_OTHER): Payer: Medicare Other | Admitting: Gastroenterology

## 2014-01-29 VITALS — BP 140/81 | HR 73 | Temp 97.8°F | Ht 70.0 in | Wt 210.8 lb

## 2014-01-29 DIAGNOSIS — R110 Nausea: Secondary | ICD-10-CM | POA: Diagnosis not present

## 2014-01-29 DIAGNOSIS — R1011 Right upper quadrant pain: Secondary | ICD-10-CM

## 2014-01-29 DIAGNOSIS — R1310 Dysphagia, unspecified: Secondary | ICD-10-CM | POA: Diagnosis not present

## 2014-01-29 DIAGNOSIS — K219 Gastro-esophageal reflux disease without esophagitis: Secondary | ICD-10-CM

## 2014-01-29 NOTE — Progress Notes (Signed)
Primary Care Physician:  ALABANZA,THOMAS, MD  Primary Gastroenterologist:  Michael Rourk, MD   Chief Complaint  Patient presents with  . EGD    HPI:  Alex Hoffman is a 52 y.o. male here to schedule EGD. Last seen 12/2013. Has h/o chronic pancreatitis, GERD, fatty liver, mildly elevated alkphos, constipation.   Continues to have RUQ pain. Had been more episodic but now more constant. Pain worse at night. Constant pain. Feels like heavy pressure. Mid-abdomen into ruq/epigastric area. Nausea all the time. Pain is debilitating. Continued to have constant lower abdominal discomfort and severe dysuria followed by Dr. Wrenn. Has rectal pain with BMs at times. Bad headache on Amitiza. BM regular on dulcolax every 2-3 days.No melena, brbpr. Nausea severe, could not survive without compazine. Breakthrough heartburn on Nexium. Chronic dysphagia, not able to eat meat.Pills hard to swallow.  Current Outpatient Prescriptions  Medication Sig Dispense Refill  . amLODipine (NORVASC) 10 MG tablet Take 1 tablet (10 mg total) by mouth every morning.  30 tablet  3  . cyclobenzaprine (FLEXERIL) 10 MG tablet Take 1 tablet (10 mg total) by mouth 3 (three) times daily as needed for muscle spasms.  100 tablet  3  . diazepam (VALIUM) 5 MG tablet Take 1 tablet (5 mg total) by mouth every 6 (six) hours as needed for anxiety.  30 tablet  2  . esomeprazole (NEXIUM) 40 MG capsule Take 1 capsule (40 mg total) by mouth 2 (two) times daily. For indigestion  60 capsule  11  . Lidocaine-Hydrocortisone Ace 3-0.5 % KIT Place 1 application rectally 2 (two) times daily.  30 each  0  . lubiprostone (AMITIZA) 24 MCG capsule Take 1 capsule (24 mcg total) by mouth 2 (two) times daily with a meal.  60 capsule  3  . methadone (DOLOPHINE) 10 MG tablet Take 5 tablets (50 mg total) by mouth every 8 (eight) hours.  450 tablet  0  . oxycodone (ROXICODONE) 30 MG immediate release tablet Take 1 tablet (30 mg total) by mouth every 4 (four) hours  as needed for pain.  100 tablet  0  . Pancrelipase, Lip-Prot-Amyl, (ZENPEP) 40000 UNITS CPEP 3 capsules with meals and 2 with snacks.  400 capsule  5  . phenazopyridine (PYRIDIUM) 100 MG tablet Take 200 mg by mouth daily.       . phenazopyridine (PYRIDIUM) 200 MG tablet Take 1 tablet (200 mg total) by mouth 3 (three) times daily as needed for pain.  30 tablet  2  . prochlorperazine (COMPAZINE) 10 MG tablet Take 5 mg by mouth 3 (three) times daily.       . Rivaroxaban (XARELTO) 20 MG TABS tablet Take 1 tablet (20 mg total) by mouth daily with supper.  30 tablet  12  . silodosin (RAPAFLO) 8 MG CAPS capsule Take 1 capsule (8 mg total) by mouth daily with breakfast.  30 capsule  3  . tiZANidine (ZANAFLEX) 4 MG capsule Take 1 capsule up to 4 times a day for spasms.  120 capsule  3  . zolpidem (AMBIEN) 5 MG tablet Take 1 tablet (5 mg total) by mouth at bedtime as needed for sleep.  30 tablet  3   No current facility-administered medications for this visit.    Allergies as of 01/29/2014 - Review Complete 01/29/2014  Allergen Reaction Noted  . Reglan [metoclopramide]  08/09/2013    Past Medical History  Diagnosis Date  . Pulmonary embolism 12/2010  . Cystic thyroid nodule       left  . HTN (hypertension)   . Heterozygous factor V Leiden mutation 12/2010  . Pancreatitis 05/2011    EUS Dr Jacobs 06/09/11-> dilated main pancreatic duct in HOP, 2-3 filling defects distal pancreatic duct, peripancreatic fluid resolved  . C. difficile colitis 01/2011?  . Arthritis   . Clotting disorder   . GERD (gastroesophageal reflux disease)   . Chills   . Fever   . Weight loss   . Hearing loss   . Nasal congestion   . Leg swelling   . Palpitations   . Abdominal distension   . Abdominal pain   . Nausea & vomiting   . Rectal pain   . Difficulty urinating   . Arthritis pain   . Headache(784.0)   . Weakness   . Bruises easily   . Anemia   . Dizziness   . Numbness and tingling     both hands, and both legs  and feet  . Joint pain   . Blood in urine     hx of, none current  . Frequent urination at night   . Pneumonia as child  . Abnormal EKG     hx left bundle branch block on ekg  . Sleep apnea     STOPBANG=5  . Heterozygous factor V Leiden mutation   . Prostatitis, chronic   . Pancreatitis   . Pancreatitis 12/2011  . Arthritis     lower back. Dr. Alabanza in [place]  . GERD (gastroesophageal reflux disease)   . HTN (hypertension) 01/28/2014  . BPH (benign prostatic hyperplasia) 01/28/2014    Past Surgical History  Procedure Laterality Date  . Neck surgery  1999    C5/C6 fusion, limited neck mobility especially turning to left  . Knee surgery  1980's    arthroscopy, left knee  . Femur fracture surgery  age 4    right leg  . Nerve surgery  1980's    right thumb  . Colonoscopy  01/2011    Dr. Brian Beachum at [facility]-->normal  . Esophagogastroduodenoscopy  01/2011    Dr. Brian Beachum at [facility]-->bile reflux in stomach-->patient c/o inadequate conscious sedation (Fentanyl 100mg/Versed 8mg)  . Ercp  07/2011    Dr. Soledad [facility]-> pancreatic enterotomy, pancreatic duct stones removed, bile duct not manipulated, no stents  . Cholecystectomy  01/25/2012    Procedure: LAPAROSCOPIC CHOLECYSTECTOMY WITH INTRAOPERATIVE CHOLANGIOGRAM;  Surgeon: Paul S Toth III, MD;  Location: [facility];  Service: General;  Laterality: N/A;  . Cystoscopy with hydrodistension and biopsy  01/25/2012    Procedure: CYSTOSCOPY/BIOPSY/HYDRODISTENSION;  Surgeon: John J Wrenn, MD;  Location: [facility];  Service: Urology;  Laterality: N/A;  Cystoscopy and Hydrodistension of the Bladder  . Colonoscopy with propofol  11/22/2012    RMR:friable anal rectum/fissure not identified although he certainly could have a chronic anal fissure     Family History  Problem Relation Age of Onset  . GI problems Brother   . Colon cancer Neg Hx   . Liver disease Neg Hx   . GI problems      stomach and lung cancer, aunts/uncles  . Heart attack  Brother     before age of 60  . Stroke Brother   . Hypertension Mother   . Hypertension Father   . Cancer Maternal Aunt     colon/pancreas/lung/stomach  . Heart disease Maternal Uncle   . Cancer Maternal Grandfather     lung    History   Social History  . Marital Status: Married      Spouse Name: N/A    Number of Children: 2  . Years of Education: N/A   Occupational History  .      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 Smoking status: Former Smoker -- 1.00 packs/day for 35 years    Types: Cigarettes    Quit date: 12/21/2012  . Smokeless tobacco: Never Used     Comment: About 2 cigarettes per day.   . Alcohol Use: No     Comment: No alcohol since January 27, 2011  . Drug Use: No  . Sexual Activity: Not on file   Other Topics Concern  . Not on file   Social History Narrative  . No narrative on file      ROS:  General: Negative for anorexia, weight loss, fever, chills, fatigue, weakness. Eyes: Negative for vision changes.  ENT: Negative for hoarseness, difficulty swallowing , nasal congestion. CV: Negative for chest pain, angina, palpitations, dyspnea on exertion, peripheral edema.  Respiratory: Negative for dyspnea at rest, dyspnea on exertion, cough, sputum, wheezing.  GI: See history of present illness. GU:  Negative for urinary incontinence, urinary frequency, nocturnal urination. See hpi MS: Negative for joint pain, low back pain.  Derm: Negative for rash or itching.  Neuro: Negative for weakness, abnormal sensation, seizure, frequent headaches, memory loss, confusion.  Psych: Negative for anxiety, depression, suicidal ideation, hallucinations.  Endo: Negative for unusual weight change.  Heme: Negative for bruising or bleeding. Allergy: Negative for rash or hives.    Physical Examination:  BP 140/81  Pulse 73  Temp(Src) 97.8 F (36.6 C) (Oral)  Ht 5' 10" (1.778 m)  Wt 210 lb  12.8 oz (95.618 kg)  BMI 30.25 kg/m2   General: Well-nourished, well-developed in no acute distress.  Head: Normocephalic, atraumatic.   Eyes: Conjunctiva pink, no icterus. Mouth: Oropharyngeal mucosa moist and pink , no lesions erythema or exudate.poor dentition Neck: Supple without thyromegaly, masses, or lymphadenopathy.  Lungs: Clear to auscultation bilaterally.  Heart: Regular rate and rhythm, no murmurs rubs or gallops.  Abdomen: Bowel sounds are normal, mild diffuse tenderness to light palpation, ruq tenderness and epigastric and suprapubic more pronounced.  nondistended, no hepatosplenomegaly or masses, no abdominal bruits or    hernia , no rebound or guarding.   Rectal: not performed Extremities: No lower extremity edema. No clubbing or deformities.  Neuro: Alert and oriented x 4 , grossly normal neurologically.  Skin: Warm and dry, no rash or jaundice.   Psych: Alert and cooperative, normal mood and affect.  Labs: Lab Results  Component Value Date   LIPASE 15 01/28/2014   Lab Results  Component Value Date   WBC 7.6 01/28/2014   HGB 13.0 01/28/2014   HCT 38.4* 01/28/2014   MCV 84.8 01/28/2014   PLT 376 01/28/2014   Lab Results  Component Value Date   ALT 25 01/13/2014   AST 22 01/13/2014   ALKPHOS 105 01/13/2014   BILITOT 1.0 01/13/2014   Lab Results  Component Value Date   CREATININE 0.85 07/22/2013   BUN 8 07/22/2013   NA 137 07/22/2013   K 3.7 07/22/2013   CL 100 07/22/2013   CO2 29 07/22/2013     Imaging Studies: No results found.    

## 2014-01-29 NOTE — Patient Instructions (Signed)
1. Upper endoscopy with Dr. Rourk. See separate instructions.  

## 2014-01-29 NOTE — Assessment & Plan Note (Signed)
Persistent ruq/epigastric pain, refractory heartburn, intermittent nausea, chronic dysphagia. Recommend EGD/ED for further evaluation to rule out PUD, complicated gerd, esophageal stricture. Plan for deep sedation given polypharmacy.  I have discussed the risks, alternatives, benefits with regards to but not limited to the risk of reaction to medication, bleeding, infection, perforation and the patient is agreeable to proceed. Written consent to be obtained.  Symptoms may be in part due to chronic pancreatitis. Continue enzymes as before.   Patient is on Xarelto. Advised to hold 24 hours by Dr. Zigmund DanielFormanek, may need to hold 48 hours if plan for esophageal dilation. To discuss with Dr. Jena Gaussourk.

## 2014-01-30 ENCOUNTER — Ambulatory Visit (HOSPITAL_COMMUNITY): Payer: Medicare Other

## 2014-01-30 ENCOUNTER — Other Ambulatory Visit (HOSPITAL_COMMUNITY): Payer: Medicare Other

## 2014-01-30 NOTE — Progress Notes (Signed)
cc'd to pcp 

## 2014-01-31 NOTE — Progress Notes (Signed)
Labs drawn on physician visit encounter.

## 2014-01-31 NOTE — Progress Notes (Signed)
This encounter was created in error - please disregard.

## 2014-02-03 ENCOUNTER — Encounter (HOSPITAL_COMMUNITY): Payer: Self-pay | Admitting: Oncology

## 2014-02-04 ENCOUNTER — Encounter (HOSPITAL_COMMUNITY): Payer: Self-pay | Admitting: Pharmacy Technician

## 2014-02-04 NOTE — Progress Notes (Signed)
This encounter was created in error - please disregard.

## 2014-02-05 ENCOUNTER — Telehealth: Payer: Self-pay | Admitting: Gastroenterology

## 2014-02-05 NOTE — Telephone Encounter (Signed)
It was put on his instructions

## 2014-02-05 NOTE — Telephone Encounter (Signed)
Please let patient know that Dr. Jena Gaussourk wants him to hold his Xarelto for 48 hours prior to his upcoming EGD.

## 2014-02-07 ENCOUNTER — Inpatient Hospital Stay (HOSPITAL_COMMUNITY): Admission: RE | Admit: 2014-02-07 | Payer: Medicare Other | Source: Ambulatory Visit

## 2014-02-07 ENCOUNTER — Encounter: Payer: Self-pay | Admitting: Gastroenterology

## 2014-02-10 ENCOUNTER — Encounter (HOSPITAL_COMMUNITY)
Admission: RE | Admit: 2014-02-10 | Discharge: 2014-02-10 | Disposition: A | Discharge status: Home / Self Care | Payer: Medicare Other | Source: Ambulatory Visit | Attending: Internal Medicine | Admitting: Internal Medicine

## 2014-02-10 ENCOUNTER — Encounter (HOSPITAL_COMMUNITY): Payer: Self-pay

## 2014-02-10 DIAGNOSIS — Z538 Procedure and treatment not carried out for other reasons: Secondary | ICD-10-CM | POA: Diagnosis not present

## 2014-02-10 DIAGNOSIS — R109 Unspecified abdominal pain: Secondary | ICD-10-CM | POA: Diagnosis not present

## 2014-02-10 DIAGNOSIS — R1310 Dysphagia, unspecified: Secondary | ICD-10-CM | POA: Diagnosis not present

## 2014-02-10 DIAGNOSIS — Z79899 Other long term (current) drug therapy: Secondary | ICD-10-CM | POA: Diagnosis not present

## 2014-02-10 DIAGNOSIS — I10 Essential (primary) hypertension: Secondary | ICD-10-CM | POA: Diagnosis not present

## 2014-02-10 DIAGNOSIS — Z87891 Personal history of nicotine dependence: Secondary | ICD-10-CM | POA: Diagnosis not present

## 2014-02-10 DIAGNOSIS — R110 Nausea: Secondary | ICD-10-CM | POA: Diagnosis not present

## 2014-02-10 DIAGNOSIS — K219 Gastro-esophageal reflux disease without esophagitis: Secondary | ICD-10-CM | POA: Diagnosis not present

## 2014-02-10 LAB — BASIC METABOLIC PANEL
BUN: 13 mg/dL (ref 6–23)
CHLORIDE: 99 meq/L (ref 96–112)
CO2: 30 mEq/L (ref 19–32)
Calcium: 9.6 mg/dL (ref 8.4–10.5)
Creatinine, Ser: 0.9 mg/dL (ref 0.50–1.35)
GFR calc non Af Amer: >90 mL/min (ref >90)
Glucose, Bld: 93 mg/dL (ref 70–99)
POTASSIUM: 4.7 meq/L (ref 3.7–5.3)
SODIUM: 140 meq/L (ref 137–147)

## 2014-02-10 NOTE — Patient Instructions (Addendum)
20    Your procedure is scheduled on: 02/13/2014  [facility] at   8:45  AM.  Call this number if you have problems the morning of surgery: [phone]   Remember:   Do not drink or eat food:After Midnight.      Take these medicines the morning of surgery with A SIP OF WATER: Amlodipine and nexium    Hold xarelto doses 4/14, 4/15   Do not wear jewelry, make-up or nail polish.  Do not wear lotions, powders, or perfumes. You may wear deodorant.  Do not shave 48 hours prior to surgery. Men may shave face and neck.  Do not bring valuables to the hospital.  Contacts, dentures or bridgework may not be worn into surgery.  Leave suitcase in the car. After surgery it may be brought to your room.  For patients admitted to the hospital, checkout time is 11:00 AM the day of discharge.   Patients discharged the day of surgery will not be allowed to drive home.  Name and phone number of your driver:    Please read over the following fact sheets that you were given: Pain Booklet, Lab Information and Anesthesia Post-op Instructions   Endoscopy Care After Please read the instructions outlined below and refer to this sheet in the next few weeks. These discharge instructions provide you with general information on caring for yourself after you leave the hospital. Your doctor may also give you specific instructions. While your treatment has been planned according to the most current medical practices available, unavoidable complications occasionally occur. If you have any problems or questions after discharge, please call your doctor. HOME CARE INSTRUCTIONS Activity  You may resume your regular activity but move at a slower pace for the next 24 hours.   Take frequent rest periods for the next 24 hours.   Walking will help expel (get rid of) the air and reduce the bloated feeling in your abdomen.   No driving for 24 hours (because of the anesthesia (medicine) used during the test).   You may  shower.   Do not sign any important legal documents or operate any machinery for 24 hours (because of the anesthesia used during the test).  Nutrition  Drink plenty of fluids.   You may resume your normal diet.   Begin with a light meal and progress to your normal diet.   Avoid alcoholic beverages for 24 hours or as instructed by your caregiver.  Medications You may resume your normal medications unless your caregiver tells you otherwise. What you can expect today  You may experience abdominal discomfort such as a feeling of fullness or "gas" pains.   You may experience a sore throat for 2 to 3 days. This is normal. Gargling with salt water may help this.  Follow-up Your doctor will discuss the results of your test with you. SEEK IMMEDIATE MEDICAL CARE IF:  You have excessive nausea (feeling sick to your stomach) and/or vomiting.   You have severe abdominal pain and distention (swelling).   You have trouble swallowing.   You have a temperature over 100 F (37.8 C).   You have rectal bleeding or vomiting of blood.  Document Released: 05/31/2004 Document Revised: 10/06/2011 Document Reviewed: 12/12/2007 Esophageal Dilatation The esophagus is the long, narrow tube which carries food and liquid from the mouth to the stomach. Esophageal dilatation is the technique used to stretch a blocked or narrowed portion of the esophagus. This procedure is used when a part  of the esophagus has become so narrow that it becomes difficult, painful or even impossible to swallow. This is generally an uncomplicated form of treatment. When this is not successful, chest surgery may be required. This is a much more extensive form of treatment with a longer recovery time. CAUSES  Some of the more common causes of blockage or strictures of the esophagus are:  Narrowing from longstanding inflammation (soreness and redness) of the lower esophagus. This comes from the constant exposure of the lower esophagus  to the acid which bubbles up from the stomach. Over time this causes scarring and narrowing of the lower esophagus.  Hiatal hernia in which a small part of the stomach bulges (herniates) up through the diaphragm. This can cause a gradual narrowing of the end of the esophagus.  Schatzki's Ring is a narrow ring of benign (non-cancerous) fibrous tissue which constricts the lower esophagus. The reason for this is not known.  Scleroderma is a connective tissue disorder that affects the esophagus and makes swallowing difficult.  Achalasia is an absence of nerves to the lower esophagus and to the esophageal sphincter. This is the circular muscle between the stomach and esophagus that relaxes to allow food into the stomach. After swallowing, it contracts to keep food in the stomach. This absence of nerves may be congenital (present since birth). This can cause irregular spasms of the lower esophageal muscle. This spasm does not open up to allow food and fluid through. The result is a persistent blockage with subsequent slow trickling of the esophageal contents into the stomach.  Strictures may develop from swallowing materials which damage the esophagus. Some examples are strong acids or alkalis such as lye.  Growths such as benign (non-cancerous) and malignant (cancerous) tumors can block the esophagus.  Heredity (present since birth) causes. DIAGNOSIS  Your caregiver often suspects this problem by taking a medical history. They will also do a physical exam. They can then prove their suspicions using X-rays and endoscopy. Endoscopy is an exam in which a tube like a small flexible telescope is used to look at your esophagus.  TREATMENT There are different stretching (dilating) techniques which can be used. Simple bougie dilatation may be done in the office. This usually takes only a couple minutes. A numbing (anesthetic) spray of the throat is used. Endoscopy, when done, is done in an endoscopy suite,  under mild sedation. When fluoroscopy is used, the procedure is performed in X-ray. Other techniques require a little longer time. Recovery is usually quick. There is no waiting time to begin eating and drinking to test success of the treatment. Following are some of the methods used. Narrowing of the esophagus is treated by making it bigger. Commonly this is a mechanical problem which can be treated with stretching. This can be done in different ways. Your caregiver will discuss these with you. Some of the means used are:  A series of graduated (increasing thickness) flexible dilators can be used. These are weighted tubes passed through the esophagus into the stomach. The tubes used become progressively larger until the desired stretched size is reached. Graduated dilators are a simple and quick way of opening the esophagus. No visualization is required.  Another method is the use of endoscopy to place a flexible wire across the stricture. The endoscope is removed and the wire left in place. A dilator with a hole through it from end to end is guided down the esophagus and across the stricture. One or more of these dilators  are passed over the wire. At the end of the exam, the wire is removed. This type of treatment may be performed in the X-ray department under fluoroscopy. An advantage of this procedure is the examiner is visualizing the end opening in the esophagus.  Stretching of the esophagus may be done using balloons. Deflated balloons are placed through the endoscope and across the stricture. This type of balloon dilatation is often done at the time of endoscopy or fluoroscopy. Flexible endoscopy allows the examiner to directly view the stricture. A balloon is inserted in the deflated form into the area of narrowing. It is then inflated with air to a certain pressure that is pre-set for a given circumference. When inflated, it becomes sausage shaped, stretched, and makes the stricture  larger.  Achalasia requires a longer larger balloon-type dilator. This is frequently done under X-ray control. In this situation, the spastic muscle fibers in the lower esophagus are stretched. All of the above procedures make the passage of food and water into the stomach easier. They also make it easier for stomach contents to reflux back into the esophagus. Special medications may be used following the procedure to help prevent further stricturing. Proton-pump inhibitor medications are good at decreasing the amount of acid in the stomach juice. When stomach juice refluxes into the esophagus, the juice is no longer as acidic and is less likely to burn or scar the esophagus. RISKS AND COMPLICATIONS Esophageal dilatation is usually performed effectively and without problems. Some complications that can occur are:  A small amount of bleeding almost always happens where the stretching takes place. If this is too excessive it may require more aggressive treatment.  An uncommon complication is perforation (making a hole) of the esophagus. The esophagus is thin. It is easy to make a hole in it. If this happens, an operation may be necessary to repair this.  A small, undetected perforation could lead to an infection in the chest. This can be very serious. HOME CARE INSTRUCTIONS   If you received sedation for your procedure, do not drive, make important decisions, or perform any activities requiring your full coordination. Do not drink alcohol, take sedatives, or use any mind altering chemicals unless instructed by your caregiver.  You may use throat lozenges or warm salt water gargles if you have throat discomfort  You can begin eating and drinking normally on return home unless instructed otherwise. Do not purposely try to force large chunks of food down to test the benefits of your procedure.  Mild discomfort can be eased with sips of ice water.  Medications for discomfort may or may not be  needed. SEEK IMMEDIATE MEDICAL CARE IF:   You begin vomiting up blood.  You develop black tarry stools  You develop chills or an unexplained temperature of over 101 F (38.3 C)  You develop chest or abdominal pain.  You develop shortness of breath or feel lightheaded or faint.  Your swallowing is becoming more painful, difficult, or you are unable to swallow. MAKE SURE YOU:   Understand these instructions.  Will watch your condition.  Will get help right away if you are not doing well or get worse. Document Released: 12/08/2005 Document Revised: 01/09/2012 Document Reviewed: 01/25/2006 [facility] Patient Information 2014 [place], [place].

## 2014-02-13 ENCOUNTER — Ambulatory Visit (HOSPITAL_COMMUNITY)
Admission: RE | Admit: 2014-02-13 | Discharge: 2014-02-13 | Disposition: A | Discharge status: Home / Self Care | Payer: Medicare Other | Source: Ambulatory Visit | Attending: Internal Medicine | Admitting: Internal Medicine

## 2014-02-13 ENCOUNTER — Encounter: Payer: Self-pay | Admitting: Internal Medicine

## 2014-02-13 ENCOUNTER — Ambulatory Visit (HOSPITAL_COMMUNITY): Payer: Medicare Other | Admitting: Anesthesiology

## 2014-02-13 ENCOUNTER — Encounter (HOSPITAL_COMMUNITY)
Admission: RE | Disposition: A | Discharge status: Home / Self Care | Payer: Self-pay | Source: Ambulatory Visit | Attending: Internal Medicine

## 2014-02-13 ENCOUNTER — Encounter (HOSPITAL_COMMUNITY): Payer: Self-pay | Admitting: *Deleted

## 2014-02-13 ENCOUNTER — Encounter (HOSPITAL_COMMUNITY): Payer: Medicare Other | Admitting: Anesthesiology

## 2014-02-13 DIAGNOSIS — K219 Gastro-esophageal reflux disease without esophagitis: Secondary | ICD-10-CM | POA: Insufficient documentation

## 2014-02-13 DIAGNOSIS — Z87891 Personal history of nicotine dependence: Secondary | ICD-10-CM | POA: Insufficient documentation

## 2014-02-13 DIAGNOSIS — R109 Unspecified abdominal pain: Secondary | ICD-10-CM | POA: Insufficient documentation

## 2014-02-13 DIAGNOSIS — Z538 Procedure and treatment not carried out for other reasons: Secondary | ICD-10-CM | POA: Insufficient documentation

## 2014-02-13 DIAGNOSIS — R110 Nausea: Secondary | ICD-10-CM | POA: Insufficient documentation

## 2014-02-13 DIAGNOSIS — Z79899 Other long term (current) drug therapy: Secondary | ICD-10-CM | POA: Insufficient documentation

## 2014-02-13 DIAGNOSIS — R1319 Other dysphagia: Secondary | ICD-10-CM | POA: Diagnosis not present

## 2014-02-13 DIAGNOSIS — I10 Essential (primary) hypertension: Secondary | ICD-10-CM | POA: Insufficient documentation

## 2014-02-13 DIAGNOSIS — R1310 Dysphagia, unspecified: Secondary | ICD-10-CM | POA: Diagnosis not present

## 2014-02-13 HISTORY — PX: BIOPSY: SHX5522

## 2014-02-13 HISTORY — PX: ESOPHAGOGASTRODUODENOSCOPY (EGD) WITH PROPOFOL: SHX5813

## 2014-02-13 SURGERY — ESOPHAGOGASTRODUODENOSCOPY (EGD) WITH PROPOFOL
Anesthesia: Monitor Anesthesia Care | Site: Esophagus

## 2014-02-13 MED ORDER — GLYCOPYRROLATE 0.2 MG/ML IJ SOLN
INTRAMUSCULAR | Status: AC
  Filled 2014-02-13: qty 1

## 2014-02-13 MED ORDER — GLYCOPYRROLATE 0.2 MG/ML IJ SOLN
0.2000 mg | Freq: Once | INTRAMUSCULAR | Status: AC
  Administered 2014-02-13: 0.2 mg via INTRAVENOUS

## 2014-02-13 MED ORDER — FENTANYL CITRATE 0.05 MG/ML IJ SOLN
25.0000 ug | INTRAMUSCULAR | Status: AC
  Administered 2014-02-13 (×2): 25 ug via INTRAVENOUS

## 2014-02-13 MED ORDER — LACTATED RINGERS IV SOLN
INTRAVENOUS | Status: DC
  Administered 2014-02-13: 08:00:00 via INTRAVENOUS

## 2014-02-13 MED ORDER — MIDAZOLAM HCL 2 MG/2ML IJ SOLN
1.0000 mg | INTRAMUSCULAR | Status: DC | PRN
  Administered 2014-02-13: 2 mg via INTRAVENOUS

## 2014-02-13 MED ORDER — PROPOFOL 10 MG/ML IV BOLUS
INTRAVENOUS | Status: AC
  Filled 2014-02-13: qty 20

## 2014-02-13 MED ORDER — FENTANYL CITRATE 0.05 MG/ML IJ SOLN
INTRAMUSCULAR | Status: AC
  Filled 2014-02-13: qty 2

## 2014-02-13 MED ORDER — ONDANSETRON HCL 4 MG/2ML IJ SOLN: 4.0000 mg | Freq: Once | INTRAMUSCULAR | Status: DC | PRN

## 2014-02-13 MED ORDER — LIDOCAINE VISCOUS 2 % MT SOLN
OROMUCOSAL | Status: DC | PRN
  Administered 2014-02-13: 4 mL via OROMUCOSAL

## 2014-02-13 MED ORDER — STERILE WATER FOR IRRIGATION IR SOLN
Status: DC | PRN
  Administered 2014-02-13: 08:00:00

## 2014-02-13 MED ORDER — PROPOFOL 10 MG/ML IV BOLUS
INTRAVENOUS | Status: DC | PRN
  Administered 2014-02-13: 20 mg via INTRAVENOUS

## 2014-02-13 MED ORDER — FENTANYL CITRATE 0.05 MG/ML IJ SOLN: 25.0000 ug | INTRAMUSCULAR | Status: DC | PRN

## 2014-02-13 MED ORDER — LIDOCAINE VISCOUS 2 % MT SOLN
OROMUCOSAL | Status: AC
  Filled 2014-02-13: qty 15

## 2014-02-13 MED ORDER — PROPOFOL INFUSION 10 MG/ML OPTIME
INTRAVENOUS | Status: DC | PRN
Start: 2014-02-13 — End: 2014-02-13
  Administered 2014-02-13: 150 ug/kg/min via INTRAVENOUS

## 2014-02-13 MED ORDER — MIDAZOLAM HCL 2 MG/2ML IJ SOLN
INTRAMUSCULAR | Status: AC
  Filled 2014-02-13: qty 2

## 2014-02-13 SURGICAL SUPPLY — 21 items
BLOCK BITE 60FR ADLT L/F BLUE (MISCELLANEOUS) ×4 IMPLANT
DEVICE CLIP HEMOSTAT 235CM (CLIP) IMPLANT
ELECT REM PT RETURN 9FT ADLT (ELECTROSURGICAL)
ELECTRODE REM PT RTRN 9FT ADLT (ELECTROSURGICAL) IMPLANT
FLOOR PAD 36X40 (MISCELLANEOUS) ×4
FORCEPS BIOP RAD 4 LRG CAP 4 (CUTTING FORCEPS) ×4 IMPLANT
FORMALIN 10 PREFIL 20ML (MISCELLANEOUS) ×4 IMPLANT
KIT CLEAN ENDO COMPLIANCE (KITS) ×2 IMPLANT
MANIFOLD NEPTUNE II (INSTRUMENTS) ×2 IMPLANT
NEEDLE SCLEROTHERAPY 25GX240 (NEEDLE) ×4 IMPLANT
PAD FLOOR 36X40 (MISCELLANEOUS) ×2 IMPLANT
PROBE APC STR FIRE (PROBE) ×4 IMPLANT
PROBE INJECTION GOLD (MISCELLANEOUS) ×4
PROBE INJECTION GOLD 7FR (MISCELLANEOUS) ×2 IMPLANT
SNARE ROTATE MED OVAL 20MM (MISCELLANEOUS) ×4 IMPLANT
SNARE SHORT THROW 13M SML OVAL (MISCELLANEOUS) ×4 IMPLANT
SYR 50ML LL SCALE MARK (SYRINGE) ×3 IMPLANT
SYR INFLATION 60ML (SYRINGE) ×4 IMPLANT
TUBING ENDO SMARTCAP PENTAX (MISCELLANEOUS) ×4 IMPLANT
TUBING IRRIGATION ENDOGATOR (MISCELLANEOUS) ×4 IMPLANT
WATER STERILE IRR 1000ML POUR (IV SOLUTION) ×3 IMPLANT

## 2014-02-13 NOTE — Anesthesia Preprocedure Evaluation (Signed)
Anesthesia Evaluation  Patient identified by MRN, date of birth, ID band Patient awake    Reviewed: Allergy & Precautions, H&P , NPO status , Patient's Chart, lab work & pertinent test results  History of Anesthesia Complications Negative for: history of anesthetic complications  Airway Mallampati: II TM Distance: >3 FB     Dental  (+) Teeth Intact, Poor Dentition   Pulmonary neg pulmonary ROS, sleep apnea , pneumonia -, resolved, Current Smoker, former smoker, PE breath sounds clear to auscultation        Cardiovascular hypertension, Pt. on medications Rhythm:Regular Rate:Normal     Neuro/Psych  Headaches,    GI/Hepatic GERD-  Medicated and Controlled,  Endo/Other    Renal/GU      Musculoskeletal   Abdominal   Peds  Hematology   Anesthesia Other Findings   Reproductive/Obstetrics                           Anesthesia Physical Anesthesia Plan  ASA: III  Anesthesia Plan: MAC   Post-op Pain Management:    Induction: Intravenous  Airway Management Planned: Simple Face Mask  Additional Equipment:   Intra-op Plan:   Post-operative Plan:   Informed Consent: I have reviewed the patients History and Physical, chart, labs and discussed the procedure including the risks, benefits and alternatives for the proposed anesthesia with the patient or authorized representative who has indicated his/her understanding and acceptance.     Plan Discussed with:   Anesthesia Plan Comments:         Anesthesia Quick Evaluation

## 2014-02-13 NOTE — Op Note (Signed)
[facility] [address] [place], [id number]   ENDOSCOPY PROCEDURE REPORT  PATIENT: Alex Hoffman, Alex L.  MR#: [id number] BIRTHDATE: 1961/05/17 , 52  yrs. old GENDER: Male ENDOSCOPIST: R.  Roetta SessionsMichael Vahe Pienta, MD [facility] FACG REFERRED BY:     Dr. Charlynne CousinsAlabanza PROCEDURE DATE:  02/13/2014 PROCEDURE:     Incomplete EGD with esophageal biopsy   INDICATIONS:     Esophageal dysphagia; abdominal pain  INFORMED CONSENT:   The risks, benefits, limitations, alternatives and imponderables have been discussed.  The potential for biopsy, esophogeal dilation, etc. have also been reviewed.  Questions have been answered.  All parties agreeable.  Please see the history and physical in the medical record for more information.  MEDICATIONS:     Deep sedation per Dr. [facility]  DESCRIPTION OF PROCEDURE:   The     endoscope was introduced through the mouth and advanced to the second portion of the duodenum without difficulty or limitations.  The mucosal surfaces were surveyed very carefully during advancement of the scope and upon withdrawal.  Retroflexion view of the proximal stomach and esophagogastric junction was performed.      FINDINGS:Tubular esophagus widely patent throughout its course. Mucosa appeared normal. Stomach had quite a bit of solid and semi-formed retained food debris which precluded complete examination. This also precluded esophageal Dilation. The antrum was seen and appeared normal; the pylorus was patent. The first and second portion of the duodenum appeared normal.  THERAPEUTIC / DIAGNOSTIC MANEUVERS PERFORMED:  Biopsies of the mid and distal esophagus were taken   COMPLICATIONS:  None  IMPRESSION:   Incomplete EGD as described above. Status post esophageal biopsy  RECOMMENDATIONS:   Followup on pathology. Stop Nexium; trial Dexilant 60 mg daily. Office followup in 4 weeks to reassess and decide about repeat EGD with esophageal  dilation.    _______________________________ R. Roetta SessionsMichael Buffi Ewton, MD FACP [facility] eSigned:  R. Roetta SessionsMichael Onetta Spainhower, MD FACP [facility] 02/13/2014 8:06 AM     CC:  PATIENT NAME:  Alex Hoffman, [name] MR#: [id number]

## 2014-02-13 NOTE — Anesthesia Postprocedure Evaluation (Signed)
  Anesthesia Post-op Note  Patient: Alex Hoffman  Procedure(s) Performed: Procedure(s): ESOPHAGOGASTRODUODENOSCOPY (EGD) WITH PROPOFOL (N/A) BIOPSY (N/A)  Patient Location: PACU  Anesthesia Type:MAC  Level of Consciousness: awake, alert  and oriented  Airway and Oxygen Therapy: Patient Spontanous Breathing  Post-op Pain: none  Post-op Assessment: Post-op Vital signs reviewed, Patient's Cardiovascular Status Stable, Respiratory Function Stable, Patent Airway and No signs of Nausea or vomiting  Post-op Vital Signs: Reviewed and stable  Last Vitals:  Filed Vitals:   02/13/14 0633  Pulse: 52  Temp: 36.6 C  Resp: 18    Complications: No apparent anesthesia complications

## 2014-02-13 NOTE — Discharge Instructions (Signed)
EGD Discharge instructions Please read the instructions outlined below and refer to this sheet in the next few weeks. These discharge instructions provide you with general information on caring for yourself after you leave the hospital. Your doctor may also give you specific instructions. While your treatment has been planned according to the most current medical practices available, unavoidable complications occasionally occur. If you have any problems or questions after discharge, please call your doctor. ACTIVITY  You may resume your regular activity but move at a slower pace for the next 24 hours.   Take frequent rest periods for the next 24 hours.   Walking will help expel (get rid of) the air and reduce the bloated feeling in your abdomen.   No driving for 24 hours (because of the anesthesia (medicine) used during the test).   You may shower.   Do not sign any important legal documents or operate any machinery for 24 hours (because of the anesthesia used during the test).  NUTRITION  Drink plenty of fluids.   You may resume your normal diet.   Begin with a light meal and progress to your normal diet.   Avoid alcoholic beverages for 24 hours or as instructed by your caregiver.  MEDICATIONS  You may resume your normal medications unless your caregiver tells you otherwise.  WHAT YOU CAN EXPECT TODAY  You may experience abdominal discomfort such as a feeling of fullness or gas pains.  FOLLOW-UP  Your doctor will discuss the results of your test with you.  SEEK IMMEDIATE MEDICAL ATTENTION IF ANY OF THE FOLLOWING OCCUR:  Excessive nausea (feeling sick to your stomach) and/or vomiting.   Severe abdominal pain and distention (swelling).   Trouble swallowing.   Temperature over 101 F (37.8 C).   Rectal bleeding or vomiting of blood.    Resume Xarelto today  Stop Nexium; trial of Dexilant 60 mg daily. Go by my office for a three-week supply of free samples  Your EGD  was incomplete today. Your stomach was full food. Your esophagus could not be dilated.  Office visit with us in one month

## 2014-02-13 NOTE — Transfer of Care (Signed)
Immediate Anesthesia Transfer of Care Note  Patient: Alex PernaDavid L Hoffman  Procedure(s) Performed: Procedure(s): ESOPHAGOGASTRODUODENOSCOPY (EGD) WITH PROPOFOL (N/A) BIOPSY (N/A)  Patient Location: PACU  Anesthesia Type:MAC  Level of Consciousness: awake, alert  and oriented  Airway & Oxygen Therapy: Patient Spontanous Breathing and Patient connected to face mask oxygen  Post-op Assessment: Report given to PACU RN  Post vital signs: Reviewed  Complications: No apparent anesthesia complications

## 2014-02-13 NOTE — H&P (View-Only) (Signed)
Primary Care Physician:  Hubbard Robinson, MD  Primary Gastroenterologist:  Garfield Cornea, MD   Chief Complaint  Patient presents with  . EGD    HPI:  Alex Hoffman is a 53 y.o. male here to schedule EGD. Last seen 12/2013. Has h/o chronic pancreatitis, GERD, fatty liver, mildly elevated alkphos, constipation.   Continues to have RUQ pain. Had been more episodic but now more constant. Pain worse at night. Constant pain. Feels like heavy pressure. Mid-abdomen into ruq/epigastric area. Nausea all the time. Pain is debilitating. Continued to have constant lower abdominal discomfort and severe dysuria followed by Dr. Jeffie Pollock. Has rectal pain with BMs at times. Bad headache on Amitiza. BM regular on dulcolax every 2-3 days.No melena, brbpr. Nausea severe, could not survive without compazine. Breakthrough heartburn on Nexium. Chronic dysphagia, not able to eat meat.Pills hard to swallow.  Current Outpatient Prescriptions  Medication Sig Dispense Refill  . amLODipine (NORVASC) 10 MG tablet Take 1 tablet (10 mg total) by mouth every morning.  30 tablet  3  . cyclobenzaprine (FLEXERIL) 10 MG tablet Take 1 tablet (10 mg total) by mouth 3 (three) times daily as needed for muscle spasms.  100 tablet  3  . diazepam (VALIUM) 5 MG tablet Take 1 tablet (5 mg total) by mouth every 6 (six) hours as needed for anxiety.  30 tablet  2  . esomeprazole (NEXIUM) 40 MG capsule Take 1 capsule (40 mg total) by mouth 2 (two) times daily. For indigestion  60 capsule  11  . Lidocaine-Hydrocortisone Ace 3-0.5 % KIT Place 1 application rectally 2 (two) times daily.  30 each  0  . lubiprostone (AMITIZA) 24 MCG capsule Take 1 capsule (24 mcg total) by mouth 2 (two) times daily with a meal.  60 capsule  3  . methadone (DOLOPHINE) 10 MG tablet Take 5 tablets (50 mg total) by mouth every 8 (eight) hours.  450 tablet  0  . oxycodone (ROXICODONE) 30 MG immediate release tablet Take 1 tablet (30 mg total) by mouth every 4 (four) hours  as needed for pain.  100 tablet  0  . Pancrelipase, Lip-Prot-Amyl, (ZENPEP) 40000 UNITS CPEP 3 capsules with meals and 2 with snacks.  400 capsule  5  . phenazopyridine (PYRIDIUM) 100 MG tablet Take 200 mg by mouth daily.       . phenazopyridine (PYRIDIUM) 200 MG tablet Take 1 tablet (200 mg total) by mouth 3 (three) times daily as needed for pain.  30 tablet  2  . prochlorperazine (COMPAZINE) 10 MG tablet Take 5 mg by mouth 3 (three) times daily.       . Rivaroxaban (XARELTO) 20 MG TABS tablet Take 1 tablet (20 mg total) by mouth daily with supper.  30 tablet  12  . silodosin (RAPAFLO) 8 MG CAPS capsule Take 1 capsule (8 mg total) by mouth daily with breakfast.  30 capsule  3  . tiZANidine (ZANAFLEX) 4 MG capsule Take 1 capsule up to 4 times a day for spasms.  120 capsule  3  . zolpidem (AMBIEN) 5 MG tablet Take 1 tablet (5 mg total) by mouth at bedtime as needed for sleep.  30 tablet  3   No current facility-administered medications for this visit.    Allergies as of 01/29/2014 - Review Complete 01/29/2014  Allergen Reaction Noted  . Reglan [metoclopramide]  08/09/2013    Past Medical History  Diagnosis Date  . Pulmonary embolism 12/2010  . Cystic thyroid nodule  left  . HTN (hypertension)   . Heterozygous factor V Leiden mutation 12/2010  . Pancreatitis 05/2011    EUS Dr Ardis Hughs 06/09/11-> dilated main pancreatic duct in HOP, 2-3 filling defects distal pancreatic duct, peripancreatic fluid resolved  . C. difficile colitis 01/2011?  Marland Kitchen Arthritis   . Clotting disorder   . GERD (gastroesophageal reflux disease)   . Chills   . Fever   . Weight loss   . Hearing loss   . Nasal congestion   . Leg swelling   . Palpitations   . Abdominal distension   . Abdominal pain   . Nausea & vomiting   . Rectal pain   . Difficulty urinating   . Arthritis pain   . Headache(784.0)   . Weakness   . Bruises easily   . Anemia   . Dizziness   . Numbness and tingling     both hands, and both legs  and feet  . Joint pain   . Blood in urine     hx of, none current  . Frequent urination at night   . Pneumonia as child  . Abnormal EKG     hx left bundle branch block on ekg  . Sleep apnea     STOPBANG=5  . Heterozygous factor V Leiden mutation   . Prostatitis, chronic   . Pancreatitis   . Pancreatitis 12/2011  . Arthritis     lower back. Dr. Effie Berkshire in [place]  . GERD (gastroesophageal reflux disease)   . HTN (hypertension) 01/28/2014  . BPH (benign prostatic hyperplasia) 01/28/2014    Past Surgical History  Procedure Laterality Date  . Neck surgery  1999    C5/C6 fusion, limited neck mobility especially turning to left  . Knee surgery  1980's    arthroscopy, left knee  . Femur fracture surgery  age 71    right leg  . Nerve surgery  1980's    right thumb  . Colonoscopy  01/2011    Dr. Annitta Jersey at [facility]-->normal  . Esophagogastroduodenoscopy  01/2011    Dr. Annitta Jersey at [facility]-->bile reflux in stomach-->patient c/o inadequate conscious sedation (Fentanyl 128m/Versed 840m  . [facility]  07[facility]2011    [facility] enter[facility], pancreatic duct stones removed, bile duct not manipulated, no stents  . Cholecystectomy  01/25/2012    Procedure: LAPAROSCOPIC CHOLECYSTECTOMY WITH INTRAOPERATIVE CHOLANGIOGRAM;  Surgeon: PaMerrie RoofMD;  Location: [facility];  Service: General;  Laterality: N/A;  . Cystoscopy with hydrodistension and biopsy  01/25/2012    Procedure: CYSTOSCOPY/BIOPSY/HYDRODISTENSION;  Surgeon: JoMalka SoMD;  Location: [facility];  Service: Urology;  Laterality: N/A;  Cystoscopy and Hydrodistension of the Bladder  . 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 GI problems Brother   . Colon cancer Neg Hx   . Liver disease Neg Hx   . GI problems      stomach and lung cancer, aunts/uncles  . Heart attack  Brother     before age of 603. Stroke Brother   . Hypertension Mother   . Hypertension Father   . Cancer Maternal Aunt     colon/pancreas/lung/stomach  . Heart disease Maternal Uncle   . Cancer Maternal Grandfather     lung    History   Social History  . Marital Status: Married  Spouse Name: N/A    Number of Children: 2  . Years of Education: N/A   Occupational History  .      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 Smoking status: Former Smoker -- 1.00 packs/day for 35 years    Types: Cigarettes    Quit date: 12/21/2012  . Smokeless tobacco: Never Used     Comment: About 2 cigarettes per day.   . Alcohol Use: No     Comment: No alcohol since January 27, 2011  . Drug Use: No  . Sexual Activity: Not on file   Other Topics Concern  . Not on file   Social History Narrative  . No narrative on file      ROS:  General: Negative for anorexia, weight loss, fever, chills, fatigue, weakness. Eyes: Negative for vision changes.  ENT: Negative for hoarseness, difficulty swallowing , nasal congestion. CV: Negative for chest pain, angina, palpitations, dyspnea on exertion, peripheral edema.  Respiratory: Negative for dyspnea at rest, dyspnea on exertion, cough, sputum, wheezing.  GI: See history of present illness. GU:  Negative for urinary incontinence, urinary frequency, nocturnal urination. See hpi MS: Negative for joint pain, low back pain.  Derm: Negative for rash or itching.  Neuro: Negative for weakness, abnormal sensation, seizure, frequent headaches, memory loss, confusion.  Psych: Negative for anxiety, depression, suicidal ideation, hallucinations.  Endo: Negative for unusual weight change.  Heme: Negative for bruising or bleeding. Allergy: Negative for rash or hives.    Physical Examination:  BP 140/81  Pulse 73  Temp(Src) 97.8 F (36.6 C) (Oral)  Ht _0  (1.778 m)  Wt 210 lb  12.8 oz (95.618 kg)  BMI 30.25 kg/m2   General: Well-nourished, well-developed in no acute distress.  Head: Normocephalic, atraumatic.   Eyes: Conjunctiva pink, no icterus. Mouth: Oropharyngeal mucosa moist and pink , no lesions erythema or exudate.poor dentition Neck: Supple without thyromegaly, masses, or lymphadenopathy.  Lungs: Clear to auscultation bilaterally.  Heart: Regular rate and rhythm, no murmurs rubs or gallops.  Abdomen: Bowel sounds are normal, mild diffuse tenderness to light palpation, ruq tenderness and epigastric and suprapubic more pronounced.  nondistended, no hepatosplenomegaly or masses, no abdominal bruits or    hernia , no rebound or guarding.   Rectal: not performed Extremities: No lower extremity edema. No clubbing or deformities.  Neuro: Alert and oriented x 4 , grossly normal neurologically.  Skin: Warm and dry, no rash or jaundice.   Psych: Alert and cooperative, normal mood and affect.  Labs: Lab Results  Component Value Date   LIPASE 15 01/28/2014   Lab Results  Component Value Date   WBC 7.6 01/28/2014   HGB 13.0 01/28/2014   HCT 38.4* 01/28/2014   MCV 84.8 01/28/2014   PLT 376 01/28/2014   Lab Results  Component Value Date   ALT 25 01/13/2014   AST 22 01/13/2014   ALKPHOS 105 01/13/2014   BILITOT 1.0 01/13/2014   Lab Results  Component Value Date   CREATININE 0.85 07/22/2013   BUN 8 07/22/2013   NA 137 07/22/2013   K 3.7 07/22/2013   CL 100 07/22/2013   CO2 29 07/22/2013     Imaging Studies: No results found.

## 2014-02-13 NOTE — OR Nursing (Signed)
Patient confirmed that  His last dose of xalrelto was Monday 02-10-2014

## 2014-02-13 NOTE — Interval H&P Note (Signed)
History and Physical Interval Note:  02/13/2014 7:30 AM  Alex Hoffman  has presented today for surgery, with the diagnosis of ABDOMINAL PAIN, NAUSEA, ESOPHAGEAL DYSPHAGIA  AND GERD  The various methods of treatment have been discussed with the patient and family. After consideration of risks, benefits and other options for treatment, the patient has consented to  Procedure(s) with comments: ESOPHAGOGASTRODUODENOSCOPY (EGD) WITH PROPOFOL (N/A) - 10:15-moved to 7:30am Alex GerlachLeigh Hoffman notified pt to arrive at 6:15 SAVORY DILATION (N/A) MALONEY DILATION (N/A) as a surgical intervention .  The patient's history has been reviewed, patient examined, no change in status, stable for surgery.  I have reviewed the patient's chart and labs.  Questions were answered to the patient's satisfaction.     Alex Friendsobert M Hoffman  No change. Last dose of Xarelto 4/13.  EGD with esophageal dilation as feasible/appropriate per plan.The risks, benefits, limitations, alternatives and imponderables have been reviewed with the patient. Potential for esophageal dilation, biopsy, etc. have also been reviewed.  Questions have been answered. All parties agreeable.

## 2014-02-14 ENCOUNTER — Encounter (HOSPITAL_COMMUNITY): Payer: Self-pay | Admitting: Internal Medicine

## 2014-02-15 ENCOUNTER — Encounter: Payer: Self-pay | Admitting: Internal Medicine

## 2014-02-18 ENCOUNTER — Ambulatory Visit: Payer: Medicare Other | Admitting: Gastroenterology

## 2014-02-19 ENCOUNTER — Ambulatory Visit (HOSPITAL_COMMUNITY)
Admission: RE | Admit: 2014-02-19 | Discharge: 2014-02-19 | Disposition: A | Discharge status: Home / Self Care | Payer: Medicare Other | Source: Ambulatory Visit | Attending: Hematology and Oncology | Admitting: Hematology and Oncology

## 2014-02-19 ENCOUNTER — Ambulatory Visit (HOSPITAL_COMMUNITY): Admission: RE | Admit: 2014-02-19 | Payer: Medicare Other | Source: Ambulatory Visit

## 2014-02-19 DIAGNOSIS — R0989 Other specified symptoms and signs involving the circulatory and respiratory systems: Principal | ICD-10-CM | POA: Insufficient documentation

## 2014-02-19 DIAGNOSIS — I10 Essential (primary) hypertension: Secondary | ICD-10-CM | POA: Insufficient documentation

## 2014-02-19 DIAGNOSIS — J449 Chronic obstructive pulmonary disease, unspecified: Secondary | ICD-10-CM

## 2014-02-19 DIAGNOSIS — R0609 Other forms of dyspnea: Secondary | ICD-10-CM | POA: Diagnosis not present

## 2014-02-19 MED ORDER — ALBUTEROL SULFATE (2.5 MG/3ML) 0.083% IN NEBU
2.5000 mg | INHALATION_SOLUTION | Freq: Once | RESPIRATORY_TRACT | Status: AC
  Administered 2014-02-19: 2.5 mg via RESPIRATORY_TRACT

## 2014-02-25 ENCOUNTER — Encounter (HOSPITAL_COMMUNITY): Payer: Self-pay

## 2014-02-25 ENCOUNTER — Encounter (HOSPITAL_COMMUNITY): Payer: Medicare Other | Attending: Hematology and Oncology

## 2014-02-25 ENCOUNTER — Encounter (HOSPITAL_COMMUNITY): Payer: Medicare Other

## 2014-02-25 VITALS — BP 168/94 | HR 70 | Temp 97.7°F | Resp 20 | Wt 209.0 lb

## 2014-02-25 DIAGNOSIS — D6859 Other primary thrombophilia: Secondary | ICD-10-CM | POA: Diagnosis not present

## 2014-02-25 DIAGNOSIS — R109 Unspecified abdominal pain: Secondary | ICD-10-CM | POA: Diagnosis not present

## 2014-02-25 DIAGNOSIS — G8929 Other chronic pain: Secondary | ICD-10-CM

## 2014-02-25 LAB — CBC WITH DIFFERENTIAL/PLATELET
Basophils Absolute: 0 10*3/uL (ref 0.0–0.1)
Basophils Relative: 0 % (ref 0–1)
EOS PCT: 4 % (ref 0–5)
Eosinophils Absolute: 0.3 10*3/uL (ref 0.0–0.7)
HEMATOCRIT: 37.5 % — AB (ref 39.0–52.0)
Hemoglobin: 12.8 g/dL — ABNORMAL LOW (ref 13.0–17.0)
LYMPHS PCT: 47 % — AB (ref 12–46)
Lymphs Abs: 4.1 10*3/uL — ABNORMAL HIGH (ref 0.7–4.0)
MCH: 28.3 pg (ref 26.0–34.0)
MCHC: 34.1 g/dL (ref 30.0–36.0)
MCV: 82.8 fL (ref 78.0–100.0)
MONO ABS: 0.5 10*3/uL (ref 0.1–1.0)
Monocytes Relative: 6 % (ref 3–12)
NEUTROS ABS: 3.8 10*3/uL (ref 1.7–7.7)
Neutrophils Relative %: 43 % (ref 43–77)
Platelets: 324 10*3/uL (ref 150–400)
RBC: 4.53 MIL/uL (ref 4.22–5.81)
RDW: 12.9 % (ref 11.5–15.5)
WBC: 8.7 10*3/uL (ref 4.0–10.5)

## 2014-02-25 LAB — D-DIMER, QUANTITATIVE (NOT AT ARMC): D DIMER QUANT: 0.32 ug{FEU}/mL (ref 0.00–0.48)

## 2014-02-25 MED ORDER — OXYCODONE HCL 30 MG PO TABS: 30.0000 mg | ORAL_TABLET | ORAL | Status: DC | PRN

## 2014-02-25 MED ORDER — METHADONE HCL 10 MG PO TABS: 50.0000 mg | ORAL_TABLET | Freq: Three times a day (TID) | ORAL | Status: DC

## 2014-02-25 NOTE — Patient Instructions (Signed)
[facility] Discharge Instructions  RECOMMENDATIONS MADE BY THE CONSULTANT AND ANY TEST RESULTS WILL BE SENT TO YOUR REFERRING PHYSICIAN.  We will see you in 4 weeks for labs and a doctor visit.  Prescriptions given.   Thank you for choosing [facility] to provide your oncology and hematology care.  To afford each patient quality time with our providers, please arrive at least 15 minutes before your scheduled appointment time.  With your help, our goal is to use those 15 minutes to complete the necessary work-up to ensure our physicians have the information they need to help with your evaluation and healthcare recommendations.    Effective January 1st, 2014, we ask that you re-schedule your appointment with our physicians should you arrive 10 or more minutes late for your appointment.  We strive to give you quality time with our providers, and arriving late affects you and other patients whose appointments are after yours.    Again, thank you for choosing [facility].  Our hope is that these requests will decrease the amount of time that you wait before being seen by our physicians.       _____________________________________________________________  Should you have questions after your visit to [facility], please contact our office at [phone] between the hours of 8:30 a.m. and 5:00 p.m.  Voicemails left after 4:30 p.m. will not be returned until the following business day.  For prescription refill requests, have your pharmacy contact our office with your prescription refill request.

## 2014-02-25 NOTE — Progress Notes (Signed)
Labs drawn from right ac, pt tolerated well. 23 gauge butterfly.

## 2014-02-25 NOTE — Progress Notes (Signed)
[place]  OFFICE PROGRESS NOTE  No primary provider on file. No primary provider on file.  DIAGNOSIS: Hypercoagulable state - Plan: CBC with Differential, D-dimer, quantitative  Chronic abdominal pain  Chief Complaint  Patient presents with  . Thrombophilia  . Chronic pancreatitis    CURRENT THERAPY: Methadone, oxycodone, Xarelto, pancreatic enzymes orally, Nexium.  INTERVAL HISTORY: Alex Hoffman 53 y.o. 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  He underwent EGD on 02/13/2014 but the study was incomplete because of retained food. He is planning to have repeat study done at the end of May. He still has chronic abdominal pain with lower extremity swelling and pain. He denies any fever, night sweats, melena, hematochezia, hematuria, epistaxis, or hemoptysis. He does bruise easily. He denies any abdominal distention, but does have chronic back pain without headache or seizures.  MEDICAL HISTORY: Past Medical History  Diagnosis Date  . Pulmonary embolism 12/2010  . Cystic thyroid nodule     left  . HTN (hypertension)   . Heterozygous factor V Leiden mutation 12/2010  . Pancreatitis 05/2011    EUS Dr Ardis Hughs 06/09/11-> dilated main pancreatic duct in HOP, 2-3 filling defects distal pancreatic duct, peripancreatic fluid resolved  . C. difficile colitis 01/2011?  Marland Kitchen Arthritis   . Clotting disorder   . GERD (gastroesophageal reflux disease)   . Chills   . Fever   . Weight loss   . Hearing loss   . Nasal congestion   . Leg swelling   . Palpitations   . Abdominal distension   . Abdominal pain   . Nausea & vomiting   . Rectal pain   . Difficulty urinating   . Arthritis pain   . Headache(784.0)   . Weakness   . Bruises easily   . Anemia   . Dizziness   . Numbness and tingling     both hands, and both legs and feet  . Joint pain   . Blood in urine     hx of, none current  . Frequent urination  at night   . Pneumonia as child  . Abnormal EKG     hx left bundle branch block on ekg  . Sleep apnea     STOPBANG=5  . Heterozygous factor V Leiden mutation   . Prostatitis, chronic   . Pancreatitis   . Pancreatitis 12/2011  . Arthritis     lower back. Dr. Effie Berkshire in [place]  . GERD (gastroesophageal reflux disease)   . HTN (hypertension) 01/28/2014  . BPH (benign prostatic hyperplasia) 01/28/2014  . Factor V Leiden 07/19/2013    INTERIM HISTORY: has GERD (gastroesophageal reflux disease); Chronic abdominal pain; Dysphagia, unspecified; Rectal bleeding; Constipation; Pancreatitis; Abnormal LFTs; Hematuria, gross; Diarrhea; Hypokalemia; Cholecystitis; RUQ pain; Factor V Leiden; Pain in limb; HTN (hypertension); BPH (benign prostatic hyperplasia); and Nausea alone on his problem list.    ALLERGIES:  is allergic to reglan.  MEDICATIONS: has a current medication list which includes the following prescription(s): amlodipine, diazepam, esomeprazole, ibuprofen, lidocaine-hydrocortisone ace, methadone, oxycodone, pancrelipase (lip-prot-amyl), phenazopyridine, prochlorperazine, rivaroxaban, silodosin, tizanidine, and zolpidem.  SURGICAL HISTORY:  Past Surgical History  Procedure Laterality Date  . Neck surgery  1999    C5/C6 fusion, limited neck mobility especially turning to left  . Knee surgery  1980's    arthroscopy, left knee  . Femur fracture surgery  age 3    right leg  . Nerve surgery  1980's    right thumb  . Colonoscopy  01/2011    Dr. Annitta Jersey at [facility]-->normal  . Esophagogastroduodenoscopy  01/2011    Dr. Annitta Jersey at [facility]-->bile reflux in stomach-->patient c/o inadequate conscious sedation (Fentanyl 100mg /Versed 8mg )  . Ercp  07/2011    Dr. Amalia Hailey [facility]-> pancreatic enterotomy, pancreatic duct stones removed, bile duct not manipulated, no stents  . Cholecystectomy  01/25/2012    Procedure: LAPAROSCOPIC CHOLECYSTECTOMY WITH INTRAOPERATIVE CHOLANGIOGRAM;  Surgeon: Merrie Roof, MD;  Location: [facility];  Service: General;  Laterality: N/A;  . Cystoscopy with hydrodistension and biopsy  01/25/2012    Procedure: CYSTOSCOPY/BIOPSY/HYDRODISTENSION;  Surgeon: Malka So, MD;  Location: [facility];  Service: Urology;  Laterality: N/A;  Cystoscopy and Hydrodistension of the Bladder  . Colonoscopy with propofol  11/22/2012    MEQ:ASTMHDQ anal rectum/fissure not identified although he certainly could have a chronic anal fissure   . Esophagogastroduodenoscopy (egd) with propofol N/A 02/13/2014    Procedure: ESOPHAGOGASTRODUODENOSCOPY (EGD) WITH PROPOFOL;  Surgeon: Daneil Dolin, MD;  Location: [facility];  Service: Endoscopy;  Laterality: N/A;  . Esophageal biopsy N/A 02/13/2014    Procedure: BIOPSY;  Surgeon: Daneil Dolin, MD;  Location: [facility];  Service: Endoscopy;  Laterality: N/A;    FAMILY HISTORY: family history includes Cancer in his maternal aunt and maternal grandfather; GI problems in his brother and another family member; Heart attack in his brother; Heart disease in his maternal uncle; Hypertension in his father and mother; Stroke in his brother. There is no history of Colon cancer or Liver disease.  SOCIAL HISTORY:  reports that he quit smoking about 14 months ago. His smoking use included Cigarettes. He has a 35 pack-year smoking history. He has never used smokeless tobacco. He reports that he does not drink alcohol or use illicit drugs.  REVIEW OF SYSTEMS:  Other than that discussed above is noncontributory.  PHYSICAL EXAMINATION: ECOG PERFORMANCE STATUS: 1 - Symptomatic but completely ambulatory  Blood pressure 168/94, pulse 70, temperature 97.7 F (36.5 C), temperature source Oral, resp. rate 20, weight 209 lb (94.802 kg), SpO2 99.00%.  GENERAL:alert, no distress and comfortable SKIN: skin color, texture, turgor are normal, no rashes or significant lesions EYES: PERLA; Conjunctiva are pink and non-injected, sclera clear SINUSES: No redness or tenderness  over maxillary or ethmoid sinuses OROPHARYNX:no exudate, no erythema on lips, buccal mucosa, or tongue. NECK: supple, thyroid normal size, non-tender, without nodularity. No masses CHEST: Increased AP diameter with no breast masses. LYMPH:  no palpable lymphadenopathy in the cervical, axillary or inguinal LUNGS: clear to auscultation and percussion with normal breathing effort HEART: regular rate & rhythm and no murmurs. ABDOMEN:abdomen soft, non-tender and normal bowel sounds. Diffuse guarding. No rebound tenderness. MUSCULOSKELETAL:no cyanosis of digits and no clubbing. Range of motion normal. Bilateral lower 70 swelling with negative Homan's sign but with 10 sensitivity over the calves and thighs. NEURO: alert & oriented x 3 with fluent speech, no focal motor/sensory deficits   LABORATORY DATA: Appointment on 02/25/2014  Component Date Value Ref Range Status  . WBC 02/25/2014 8.7  4.0 - 10.5 K/uL Final  . RBC 02/25/2014 4.53  4.22 - 5.81 MIL/uL Final  . Hemoglobin 02/25/2014 12.8* 13.0 - 17.0 g/dL Final  . HCT 02/25/2014 37.5* 39.0 - 52.0 % Final  . MCV 02/25/2014 82.8  78.0 - 100.0 fL Final  . MCH 02/25/2014 28.3  26.0 - 34.0 pg Final  . MCHC 02/25/2014 34.1  30.0 - 36.0  g/dL Final  . RDW 02/25/2014 12.9  11.5 - 15.5 % Final  . Platelets 02/25/2014 324  150 - 400 K/uL Final  . Neutrophils Relative % 02/25/2014 43  43 - 77 % Final  . Neutro Abs 02/25/2014 3.8  1.7 - 7.7 K/uL Final  . Lymphocytes Relative 02/25/2014 47* 12 - 46 % Final  . Lymphs Abs 02/25/2014 4.1* 0.7 - 4.0 K/uL Final  . Monocytes Relative 02/25/2014 6  3 - 12 % Final  . Monocytes Absolute 02/25/2014 0.5  0.1 - 1.0 K/uL Final  . Eosinophils Relative 02/25/2014 4  0 - 5 % Final  . Eosinophils Absolute 02/25/2014 0.3  0.0 - 0.7 K/uL Final  . Basophils Relative 02/25/2014 0  0 - 1 % Final  . Basophils Absolute 02/25/2014 0.0  0.0 - 0.1 K/uL Final  . D-Dimer, Quant 02/25/2014 0.32  0.00 - 0.48 ug/mL-FEU Final    Comment:                                 AT THE INHOUSE ESTABLISHED CUTOFF                          VALUE OF 0.48 ug/mL FEU,                          THIS ASSAY HAS BEEN DOCUMENTED                          IN THE LITERATURE TO HAVE                          A SENSITIVITY AND NEGATIVE                          PREDICTIVE VALUE OF AT LEAST                          98 TO 99%.  THE TEST RESULT                          SHOULD BE CORRELATED WITH                          AN ASSESSMENT OF THE CLINICAL                          PROBABILITY OF DVT / VTE.  Hospital Outpatient Visit on 02/10/2014  Component Date Value Ref Range Status  . Sodium 02/10/2014 140  137 - 147 mEq/L Final  . Potassium 02/10/2014 4.7  3.7 - 5.3 mEq/L Final  . Chloride 02/10/2014 99  96 - 112 mEq/L Final  . CO2 02/10/2014 30  19 - 32 mEq/L Final  . Glucose, Bld 02/10/2014 93  70 - 99 mg/dL Final  . BUN 02/10/2014 13  6 - 23 mg/dL Final  . Creatinine, Ser 02/10/2014 0.90  0.50 - 1.35 mg/dL Final  . Calcium 02/10/2014 9.6  8.4 - 10.5 mg/dL Final  . GFR calc non Af Amer 02/10/2014 >90  >90 mL/min Final  . GFR calc Af Amer 02/10/2014 >90  >90 mL/min Final   Comment: (NOTE)  The eGFR has been calculated using the CKD EPI equation.                          This calculation has not been validated in all clinical situations.                          eGFR's persistently <90 mL/min signify possible Chronic Kidney                          Disease.  Infusion on 01/28/2014  Component Date Value Ref Range Status  . WBC 01/28/2014 7.6  4.0 - 10.5 K/uL Final  . RBC 01/28/2014 4.53  4.22 - 5.81 MIL/uL Final  . Hemoglobin 01/28/2014 13.0  13.0 - 17.0 g/dL Final  . HCT 01/28/2014 38.4* 39.0 - 52.0 % Final  . MCV 01/28/2014 84.8  78.0 - 100.0 fL Final  . MCH 01/28/2014 28.7  26.0 - 34.0 pg Final  . MCHC 01/28/2014 33.9  30.0 - 36.0 g/dL Final  . RDW 01/28/2014 12.9  11.5 - 15.5 % Final  . Platelets 01/28/2014 376   150 - 400 K/uL Final  . Neutrophils Relative % 01/28/2014 41* 43 - 77 % Final  . Neutro Abs 01/28/2014 3.1  1.7 - 7.7 K/uL Final  . Lymphocytes Relative 01/28/2014 48* 12 - 46 % Final  . Lymphs Abs 01/28/2014 3.7  0.7 - 4.0 K/uL Final  . Monocytes Relative 01/28/2014 7  3 - 12 % Final  . Monocytes Absolute 01/28/2014 0.5  0.1 - 1.0 K/uL Final  . Eosinophils Relative 01/28/2014 3  0 - 5 % Final  . Eosinophils Absolute 01/28/2014 0.2  0.0 - 0.7 K/uL Final  . Basophils Relative 01/28/2014 1  0 - 1 % Final  . Basophils Absolute 01/28/2014 0.0  0.0 - 0.1 K/uL Final  . D-Dimer, Quant 01/28/2014 0.29  0.00 - 0.48 ug/mL-FEU Final   Comment:                                 AT THE INHOUSE ESTABLISHED CUTOFF                          VALUE OF 0.48 ug/mL FEU,                          THIS ASSAY HAS BEEN DOCUMENTED                          IN THE LITERATURE TO HAVE                          A SENSITIVITY AND NEGATIVE                          PREDICTIVE VALUE OF AT LEAST                          98 TO 99%.  THE TEST RESULT                          SHOULD BE CORRELATED WITH  AN ASSESSMENT OF THE CLINICAL                          PROBABILITY OF DVT / VTE.  Office Visit on 01/28/2014  Component Date Value Ref Range Status  . Color, Urine 01/28/2014 YELLOW  YELLOW Final  . APPearance 01/28/2014 CLEAR  CLEAR Final  . Specific Gravity, Urine 01/28/2014 >1.030* 1.005 - 1.030 Final  . pH 01/28/2014 5.5  5.0 - 8.0 Final  . Glucose, UA 01/28/2014 NEGATIVE  NEGATIVE mg/dL Final  . Hgb urine dipstick 01/28/2014 NEGATIVE  NEGATIVE Final  . Bilirubin Urine 01/28/2014 NEGATIVE  NEGATIVE Final  . Ketones, ur 01/28/2014 NEGATIVE  NEGATIVE mg/dL Final  . Protein, ur 01/28/2014 NEGATIVE  NEGATIVE mg/dL Final  . Urobilinogen, UA 01/28/2014 0.2  0.0 - 1.0 mg/dL Final  . Nitrite 01/28/2014 NEGATIVE  NEGATIVE Final  . Leukocytes, UA 01/28/2014 NEGATIVE  NEGATIVE Final   MICROSCOPIC NOT DONE ON  URINES WITH NEGATIVE PROTEIN, BLOOD, LEUKOCYTES, NITRITE, OR GLUCOSE <1000 mg/dL.  . Lipase 01/28/2014 15  11 - 59 U/L Final    PATHOLOGY:   for JHETT, FRETWELL ([id number]) Patient: [place], Alex Hoffman: 02/13/2014 Client: [facility] Accession: [id number] Received: 02/13/2014 Manus Rudd DOB: 04/19/1961 Age: 68 Gender: M Reported: 02/14/2014 [address] Patient Ph: [phone] MRN #: [id number] Linna Hoff [place] [address] Visit #: [id number] Chart #: Phone: [phone] Fax: CC: REPORT OF SURGICAL PATHOLOGY FINAL DIAGNOSIS Diagnosis Esophagus, biopsy - BENIGN SQUAMOUS MUCOSA WITH MILD CHANGES OF REFLUX. NO FEATURES OF EOSINOPHILIC ESOPHAGITIS (LESS THAN FIVE EOSINOPHILS PER HIGH POWER FIELD), DYSPLASIA, OR MALIGNANCY. Claudette Laws MD Pathologist, Electronic Signature (Case signed 02/14/2014) Specimen Gross and Clinical Information Specimen(s) Obtained: Esophagus, biopsy Specimen Clinical Information Pre-op: abdominal pain, nausea, esophageal dysphagia and GERD; Post-op: incomplete EGD; food in stomach; esophageal biopsy Gross Received in formalin are tan, soft tissue fragments that are submitted in toto. Number: five pieces, Size: 0.2 to 0.3 cm. (GRP:ecj 02/13/2014) Stain(s) used in Diagnosis: The following stain(s) were used in diagnosing the case: Warthin-Starry Stain. The control(s) stained appropriately. Report signed out from the following location(s) Technical Component performed at [email or web address], [place] [address] CLIA: [id number]., Technical Component performed at [email or web address], [place], [place] [address]. CLIA #: [id number], Interpretation performed at [place], [place], [place] [address]. CLIA #: [id number],   Urinalysis    Component Value Date/Time   COLORURINE YELLOW 01/28/2014 1504   APPEARANCEUR CLEAR 01/28/2014 1504   LABSPEC >1.030* 01/28/2014 1504   PHURINE  5.5 01/28/2014 1504   GLUCOSEU NEGATIVE 01/28/2014 1504   HGBUR NEGATIVE 01/28/2014 1504   BILIRUBINUR NEGATIVE 01/28/2014 1504   KETONESUR NEGATIVE 01/28/2014 1504   PROTEINUR NEGATIVE 01/28/2014 1504   UROBILINOGEN 0.2 01/28/2014 1504   NITRITE NEGATIVE 01/28/2014 1504   LEUKOCYTESUR NEGATIVE 01/28/2014 1504    RADIOGRAPHIC STUDIES:   ASSESSMENT:  #1. Chronic abdominal pain from previous bouts of pancreatitis with bilateral postphlebitic lower extremity pain.  #2. Factor V Leiden mutation on lifelong Xarelto.  #3. Chronic obstructive pulmonary disease, no longer smoking.  #4. Chronic abdominal pain from previous pancreatitis episodes  #5. Recurrent hematuria, currently resolved.    PLAN:  #1. Continue methadone, oxycodone, Nexium, pancreatic enzymes, and Xarelto. #2. Office visit in 4 weeks with CBC and d-dimer.   All questions were answered. The patient knows to call the clinic with any problems, questions or concerns. We  can certainly see the patient much sooner if necessary.   I spent 25 minutes counseling the patient face to face. The total time spent in the appointment was 30 minutes.    Farrel Gobble, MD 02/25/2014 3:47 PM  DISCLAIMER:  This note was dictated with voice recognition software.  Similar sounding words can inadvertently be transcribed inaccurately and may not be corrected upon review.

## 2014-03-03 LAB — PULMONARY FUNCTION TEST
DL/VA % PRED: 85 %
DL/VA: 4.01 ml/min/mmHg/L
DLCO cor % pred: 59 %
DLCO cor: 20.07 ml/min/mmHg
DLCO unc % pred: 59 %
DLCO unc: 20.07 ml/min/mmHg
FEF 25-75 Post: 2.23 L/sec
FEF 25-75 Pre: 2.87 L/sec
FEF2575-%CHANGE-POST: -22 %
FEF2575-%PRED-POST: 66 %
FEF2575-%Pred-Pre: 85 %
FEV1-%CHANGE-POST: -5 %
FEV1-%PRED-POST: 77 %
FEV1-%PRED-PRE: 81 %
FEV1-PRE: 3.19 L
FEV1-Post: 3.03 L
FEV1FVC-%CHANGE-POST: 4 %
FEV1FVC-%PRED-PRE: 102 %
FEV6-%Change-Post: -8 %
FEV6-%Pred-Post: 75 %
FEV6-%Pred-Pre: 82 %
FEV6-Post: 3.68 L
FEV6-Pre: 4.03 L
FEV6FVC-%Change-Post: 0 %
FEV6FVC-%PRED-PRE: 103 %
FEV6FVC-%Pred-Post: 103 %
FVC-%Change-Post: -8 %
FVC-%Pred-Post: 72 %
FVC-%Pred-Pre: 79 %
FVC-POST: 3.69 L
FVC-Pre: 4.06 L
POST FEV1/FVC RATIO: 82 %
POST FEV6/FVC RATIO: 100 %
PRE FEV6/FVC RATIO: 99 %
Pre FEV1/FVC ratio: 79 %
RV % PRED: 116 %
RV: 2.54 L
TLC % PRED: 84 %
TLC: 6.07 L

## 2014-03-06 ENCOUNTER — Other Ambulatory Visit (HOSPITAL_COMMUNITY): Payer: Self-pay | Admitting: Hematology and Oncology

## 2014-03-07 ENCOUNTER — Other Ambulatory Visit (HOSPITAL_COMMUNITY): Payer: Self-pay | Admitting: Hematology and Oncology

## 2014-03-07 MED ORDER — TIZANIDINE HCL 4 MG PO CAPS: ORAL_CAPSULE | ORAL | Status: DC

## 2014-03-18 ENCOUNTER — Encounter: Payer: Self-pay | Admitting: Gastroenterology

## 2014-03-18 ENCOUNTER — Ambulatory Visit (INDEPENDENT_AMBULATORY_CARE_PROVIDER_SITE_OTHER): Payer: Medicare Other | Admitting: Gastroenterology

## 2014-03-18 ENCOUNTER — Other Ambulatory Visit: Payer: Self-pay | Admitting: Internal Medicine

## 2014-03-18 VITALS — BP 110/69 | HR 49 | Temp 97.4°F | Ht 72.0 in | Wt 206.0 lb

## 2014-03-18 DIAGNOSIS — K219 Gastro-esophageal reflux disease without esophagitis: Secondary | ICD-10-CM

## 2014-03-18 DIAGNOSIS — R110 Nausea: Secondary | ICD-10-CM

## 2014-03-18 DIAGNOSIS — R1011 Right upper quadrant pain: Secondary | ICD-10-CM

## 2014-03-18 DIAGNOSIS — R1310 Dysphagia, unspecified: Secondary | ICD-10-CM | POA: Diagnosis not present

## 2014-03-18 NOTE — Patient Instructions (Signed)
1. Upper endoscopy as scheduled.  Please see separate instructions. 

## 2014-03-18 NOTE — Assessment & Plan Note (Signed)
53 year old gentleman presents to reschedule EGD because exam was incomplete previously. His stomach was full food. He followed directions however suspect he has delayed gastric emptying secondary to chronic narcotics. He continues to have right upper quadrant/epigastric pain, refractory heartburn, intermittent nausea, chronic dysphagia. EGD with dilation recommended to rule out peptic ulcer disease, complicated GERD, esophageal stricture. Plan for deep sedation given polypharmacy.  I have discussed the risks, alternatives, benefits with regards to but not limited to the risk of reaction to medication, bleeding, infection, perforation and the patient is agreeable to proceed. Written consent to be obtained.  Patient is on Xarelto. Advised to hold 48 hours prior to EGD/ED.   Cannot exclude symptoms related to chronic pancreatitis, will await EGD. Patient to continue enzymes as before.

## 2014-03-18 NOTE — Progress Notes (Signed)
NO PCP

## 2014-03-18 NOTE — Progress Notes (Signed)
Primary Care Physician:  No primary provider on file.  Primary Gastroenterologist:  Garfield Cornea, MD   Chief Complaint  Patient presents with  . EGD    incomplete    HPI:  Alex Hoffman is a 53 y.o. male here to reschedule his EGD. Attempted EGD on 02/13/2014 however his stomach was full of food. This precluded complete examination of the stomach as well as esophageal dilation. Esophageal biopsy at that time showed changes reflux but no eosinophilic esophagitis. Patient has a history of chronic pancreatitis, GERD, fatty liver, mildly elevated alkaline phosphatase, constipation. EGD was being done for chronic worsening right upper quadrant pain. Chronic constant nausea. Describe pain is debilitating. Also history of constant lower abdominal pain and severe dysuria followed by Dr. Jeffie Pollock. Has rectal pain with bowel movements at times. Bowel movements are regular with Dulcolax every 2-3 days. No melena rectal bleeding. Nausea severe, managed with Compazine. Breakthrough heartburn on Nexium. Chronic dysphagia, not able to eat meat. Pills are hard to swallow.  Current Outpatient Prescriptions  Medication Sig Dispense Refill  . amLODipine (NORVASC) 10 MG tablet Take 1 tablet (10 mg total) by mouth every morning.  30 tablet  3  . diazepam (VALIUM) 5 MG tablet Take 1 tablet (5 mg total) by mouth every 6 (six) hours as needed for anxiety.  30 tablet  2  . esomeprazole (NEXIUM) 40 MG capsule Take 1 capsule (40 mg total) by mouth 2 (two) times daily. For indigestion  60 capsule  11  . ibuprofen (ADVIL,MOTRIN) 200 MG tablet Take 400 mg by mouth 2 (two) times daily as needed for moderate pain.      . Lidocaine-Hydrocortisone Ace 3-0.5 % KIT Place 1 application rectally 2 (two) times daily.  30 each  0  . methadone (DOLOPHINE) 10 MG tablet Take 5 tablets (50 mg total) by mouth every 8 (eight) hours.  450 tablet  0  . oxycodone (ROXICODONE) 30 MG immediate release tablet Take 1 tablet (30 mg total) by mouth  every 4 (four) hours as needed for pain.  100 tablet  0  . Pancrelipase, Lip-Prot-Amyl, (ZENPEP) 40000 UNITS CPEP 3 capsules with meals and 2 with snacks.  400 capsule  5  . phenazopyridine (PYRIDIUM) 100 MG tablet Take 100 mg by mouth 2 (two) times daily as needed for pain.       Marland Kitchen prochlorperazine (COMPAZINE) 10 MG tablet Take 5-10 mg by mouth 3 (three) times daily.       . Rivaroxaban (XARELTO) 20 MG TABS tablet Take 1 tablet (20 mg total) by mouth daily with supper.  30 tablet  12  . silodosin (RAPAFLO) 8 MG CAPS capsule Take 1 capsule (8 mg total) by mouth daily with breakfast.  30 capsule  3  . tiZANidine (ZANAFLEX) 4 MG capsule Take 1 capsule up to 4 times a day for spasms.  120 capsule  3  . zolpidem (AMBIEN) 5 MG tablet Take 1 tablet (5 mg total) by mouth at bedtime as needed for sleep.  30 tablet  3   No current facility-administered medications for this visit.    Allergies as of 03/18/2014 - Review Complete 03/18/2014  Allergen Reaction Noted  . Reglan [metoclopramide]  08/09/2013    Past Medical History  Diagnosis Date  . Pulmonary embolism 12/2010  . Cystic thyroid nodule     left  . HTN (hypertension)   . Heterozygous factor V Leiden mutation 12/2010  . Pancreatitis 05/2011    EUS Dr Ardis Hughs 06/09/11->  dilated main pancreatic duct in HOP, 2-3 filling defects distal pancreatic duct, peripancreatic fluid resolved  . C. difficile colitis 01/2011?  . Arthritis   . Clotting disorder   . GERD (gastroesophageal reflux disease)   . Chills   . Fever   . Weight loss   . Hearing loss   . Nasal congestion   . Leg swelling   . Palpitations   . Abdominal distension   . Abdominal pain   . Nausea & vomiting   . Rectal pain   . Difficulty urinating   . Arthritis pain   . Headache(784.0)   . Weakness   . Bruises easily   . Anemia   . Dizziness   . Numbness and tingling     both hands, and both legs and feet  . Joint pain   . Blood in urine     hx of, none current  . Frequent  urination at night   . Pneumonia as child  . Abnormal EKG     hx left bundle branch block on ekg  . Sleep apnea     STOPBANG=5  . Heterozygous factor V Leiden mutation   . Prostatitis, chronic   . Pancreatitis   . Pancreatitis 12/2011  . Arthritis     lower back. Dr. Alabanza in [place]  . GERD (gastroesophageal reflux disease)   . HTN (hypertension) 01/28/2014  . BPH (benign prostatic hyperplasia) 01/28/2014  . Factor V Leiden 07/19/2013    Past Surgical History  Procedure Laterality Date  . Neck surgery  1999    C5/C6 fusion, limited neck mobility especially turning to left  . Knee surgery  1980's    arthroscopy, left knee  . Femur fracture surgery  age 4    right leg  . Nerve surgery  1980's    right thumb  . Colonoscopy  01/2011    Dr. Brian Beachum at [facility]-->normal  . Esophagogastroduodenoscopy  01/2011    Dr. Brian Beachum at [facility]-->bile reflux in stomach-->patient c/o inadequate conscious sedation (Fentanyl 100mg/Versed 8mg)  . Ercp  07/2011    Dr. Direnzo [facility]-> pancreatic enterotomy, pancreatic duct stones removed, bile duct not manipulated, no stents  . Cholecystectomy  01/25/2012    Procedure: LAPAROSCOPIC CHOLECYSTECTOMY WITH INTRAOPERATIVE CHOLANGIOGRAM;  Surgeon: Paul S Toth III, MD;  Location: [facility];  Service: General;  Laterality: N/A;  . Cystoscopy with hydrodistension and biopsy  01/25/2012    Procedure: CYSTOSCOPY/BIOPSY/HYDRODISTENSION;  Surgeon: John J Wrenn, MD;  Location: [facility];  Service: Urology;  Laterality: N/A;  Cystoscopy and Hydrodistension of the Bladder  . 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 Esophagogastroduodenoscopy (egd) with propofol N/A 02/13/2014    RMR:Incomplete EGD as described above s/p bx (mild changes of reflux on esophageal bx  . Esophageal biopsy N/A 02/13/2014    Procedure: BIOPSY;  Surgeon: Robert M Rourk, MD;  Location: [facility];   Service: Endoscopy;  Laterality: N/A;    Family History  Problem Relation Age of Onset  . GI problems Brother   . Colon cancer Neg Hx   . Liver disease Neg Hx   . GI problems      stomach and lung cancer, aunts/uncles  . Heart attack Brother     before age of 60  . Stroke Brother   . Hypertension Mother   . Hypertension Father   . Cancer Maternal Aunt       colon/pancreas/lung/stomach  . Heart disease Maternal Uncle   . Cancer Maternal Grandfather     lung    History   Social History  . Marital Status: Married    Spouse Name: N/A    Number of Children: 2  . Years of Education: N/A   Occupational History  .      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 Smoking status: Former Smoker -- 1.00 packs/day for 35 years    Types: Cigarettes    Quit date: 12/21/2012  . Smokeless tobacco: Never Used     Comment: About 2 cigarettes per day.   . Alcohol Use: No     Comment: No alcohol since January 27, 2011  . Drug Use: No  . Sexual Activity: Not on file   Other Topics Concern  . Not on file   Social History Narrative  . No narrative on file      ROS:  General: Negative for anorexia, weight loss, fever, chills, fatigue, weakness. Eyes: Negative for vision changes.  ENT: Negative for hoarseness,   nasal congestion. See hpi. CV: Negative for chest pain, angina, palpitations, dyspnea on exertion, peripheral edema.  Respiratory: Negative for dyspnea at rest, dyspnea on exertion, cough, sputum, wheezing.  GI: See history of present illness. GU:  Negative for hematuria, urinary incontinence, urinary frequency, nocturnal urination. Chronic dysuria followed by Dr. Jeffie Pollock. MS: Negative for joint pain, low back pain.  Derm: Negative for rash or itching.  Neuro: Negative for weakness, abnormal sensation, seizure, frequent headaches, memory loss, confusion.  Psych: Negative for anxiety, depression, suicidal  ideation, hallucinations.  Endo: Negative for unusual weight change.  Heme: Negative for bruising or bleeding. Allergy: Negative for rash or hives.    Physical Examination:  BP 110/69  Pulse 49  Temp(Src) 97.4 F (36.3 C) (Oral)  Ht 6' (1.829 m)  Wt 206 lb (93.441 kg)  BMI 27.93 kg/m2   General: Well-nourished, well-developed in no acute distress.  Head: Normocephalic, atraumatic.   Eyes: Conjunctiva pink, no icterus. Mouth: Oropharyngeal mucosa moist and pink , no lesions erythema or exudate. Dentition in poor repair. Neck: Supple without thyromegaly, masses, or lymphadenopathy.  Lungs: Clear to auscultation bilaterally.  Heart: Regular rate and rhythm, no murmurs rubs or gallops.  Abdomen: Bowel sounds are normal, soft. Patient requested I not palpate abdomen because it usually exacerbates his chronic pain and currently he is pain free.   Rectal: not performed Extremities: No lower extremity edema. No clubbing or deformities.  Neuro: Alert and oriented x 4 , grossly normal neurologically.  Skin: Warm and dry, no rash or jaundice.   Psych: Alert and cooperative, normal mood and affect.  Labs: Lab Results  Component Value Date   WBC 8.7 02/25/2014   HGB 12.8* 02/25/2014   HCT 37.5* 02/25/2014   MCV 82.8 02/25/2014   PLT 324 02/25/2014     Imaging Studies: No results found.

## 2014-03-25 ENCOUNTER — Encounter (HOSPITAL_COMMUNITY): Payer: Self-pay | Admitting: Pharmacy Technician

## 2014-03-25 ENCOUNTER — Encounter (HOSPITAL_BASED_OUTPATIENT_CLINIC_OR_DEPARTMENT_OTHER): Payer: Medicare Other

## 2014-03-25 ENCOUNTER — Encounter (HOSPITAL_COMMUNITY): Payer: Self-pay

## 2014-03-25 ENCOUNTER — Encounter (HOSPITAL_COMMUNITY): Payer: Medicare Other | Attending: Hematology and Oncology

## 2014-03-25 VITALS — BP 146/97 | HR 82 | Temp 97.7°F | Resp 20 | Wt 209.1 lb

## 2014-03-25 DIAGNOSIS — R109 Unspecified abdominal pain: Secondary | ICD-10-CM | POA: Diagnosis not present

## 2014-03-25 DIAGNOSIS — D6859 Other primary thrombophilia: Secondary | ICD-10-CM | POA: Diagnosis not present

## 2014-03-25 DIAGNOSIS — L97909 Non-pressure chronic ulcer of unspecified part of unspecified lower leg with unspecified severity: Secondary | ICD-10-CM | POA: Diagnosis not present

## 2014-03-25 DIAGNOSIS — L97911 Non-pressure chronic ulcer of unspecified part of right lower leg limited to breakdown of skin: Secondary | ICD-10-CM

## 2014-03-25 DIAGNOSIS — G8929 Other chronic pain: Secondary | ICD-10-CM | POA: Diagnosis not present

## 2014-03-25 DIAGNOSIS — L97921 Non-pressure chronic ulcer of unspecified part of left lower leg limited to breakdown of skin: Secondary | ICD-10-CM

## 2014-03-25 DIAGNOSIS — J449 Chronic obstructive pulmonary disease, unspecified: Secondary | ICD-10-CM

## 2014-03-25 DIAGNOSIS — G4700 Insomnia, unspecified: Secondary | ICD-10-CM | POA: Insufficient documentation

## 2014-03-25 DIAGNOSIS — J4489 Other specified chronic obstructive pulmonary disease: Secondary | ICD-10-CM

## 2014-03-25 LAB — CBC WITH DIFFERENTIAL/PLATELET
Basophils Absolute: 0 10*3/uL (ref 0.0–0.1)
Basophils Relative: 0 % (ref 0–1)
Eosinophils Absolute: 0.2 10*3/uL (ref 0.0–0.7)
Eosinophils Relative: 3 % (ref 0–5)
HEMATOCRIT: 38.8 % — AB (ref 39.0–52.0)
HEMOGLOBIN: 13.2 g/dL (ref 13.0–17.0)
Lymphocytes Relative: 37 % (ref 12–46)
Lymphs Abs: 3.1 10*3/uL (ref 0.7–4.0)
MCH: 28 pg (ref 26.0–34.0)
MCHC: 34 g/dL (ref 30.0–36.0)
MCV: 82.4 fL (ref 78.0–100.0)
MONOS PCT: 6 % (ref 3–12)
Monocytes Absolute: 0.5 10*3/uL (ref 0.1–1.0)
NEUTROS ABS: 4.6 10*3/uL (ref 1.7–7.7)
NEUTROS PCT: 54 % (ref 43–77)
Platelets: 303 10*3/uL (ref 150–400)
RBC: 4.71 MIL/uL (ref 4.22–5.81)
RDW: 12.9 % (ref 11.5–15.5)
WBC: 8.4 10*3/uL (ref 4.0–10.5)

## 2014-03-25 LAB — D-DIMER, QUANTITATIVE: D-Dimer, Quant: 0.43 ug/mL-FEU (ref 0.00–0.48)

## 2014-03-25 MED ORDER — OXYCODONE HCL 30 MG PO TABS: 30.0000 mg | ORAL_TABLET | ORAL | Status: DC | PRN

## 2014-03-25 MED ORDER — ZOLPIDEM TARTRATE 5 MG PO TABS: 5.0000 mg | ORAL_TABLET | Freq: Every evening | ORAL | Status: DC | PRN

## 2014-03-25 MED ORDER — ZOLPIDEM TARTRATE 5 MG PO TABS
5.0000 mg | ORAL_TABLET | Freq: Every evening | ORAL | Status: DC | PRN
Start: 2014-03-25 — End: 2014-03-25

## 2014-03-25 MED ORDER — METHADONE HCL 10 MG PO TABS: 50.0000 mg | ORAL_TABLET | Freq: Three times a day (TID) | ORAL | Status: DC

## 2014-03-25 MED ORDER — DIAZEPAM 5 MG PO TABS: 5.0000 mg | ORAL_TABLET | Freq: Four times a day (QID) | ORAL | Status: DC | PRN

## 2014-03-25 NOTE — Progress Notes (Signed)
Alex Hoffman's reason for visit today are for labs as scheduled per MD orders.  Venipuncture performed with a 23 gauge butterfly needle to R Antecubital.  Alex Hoffman tolerated venipuncture well and without incident; questions were answered and patient was discharged.

## 2014-03-25 NOTE — Progress Notes (Signed)
[facility]  OFFICE PROGRESS NOTE  No primary provider on file. No primary provider on file.  DIAGNOSIS: Insomnia - Plan: zolpidem (AMBIEN) 5 MG tablet, CBC with Differential, D-dimer, quantitative, DISCONTINUED: zolpidem (AMBIEN) 5 MG tablet, DISCONTINUED: zolpidem (AMBIEN) 5 MG tablet, DISCONTINUED: zolpidem (AMBIEN) 5 MG tablet  Hypercoagulable state - Plan: CBC with Differential, D-dimer, quantitative  Chronic abdominal pain - Plan: CBC with Differential, D-dimer, quantitative  Ulcers of both lower extremities, limited to breakdown of skin - Plan: Consult to vascular surgery, CBC with Differential, D-dimer, quantitative  Chief Complaint  Patient presents with  . Chronic abdominal pain secondary to pancreatitis  . Thrombophilia    CURRENT THERAPY: Methadone, oxycodone, Xarelto, pancreatic enzymes orally, Nexium.  INTERVAL HISTORY: Alex Hoffman 53 y.o. male returns for followup of thrombophilia and chronic pancreatic abdominal pain, status post aborted EGD on 02/13/2014 because of retained food.  He is developed breakthrough of the skin in both lower extremities from varicosities. Pain is excruciating to the point where he requires and is desiring crutches. He denies any chest pain, PND, orthopnea, palpitations, headache, epistaxis, melena, hematochezia, hematuria, constipation, or hemoptysis.   MEDICAL HISTORY: Past Medical History  Diagnosis Date  . Pulmonary embolism 12/2010  . Cystic thyroid nodule     left  . HTN (hypertension)   . Heterozygous factor V Leiden mutation 12/2010  . Pancreatitis 05/2011    EUS Dr Christella Hartigan 06/09/11-> dilated main pancreatic duct in HOP, 2-3 filling defects distal pancreatic duct, peripancreatic fluid resolved  . C. difficile colitis 01/2011?  Marland Kitchen Arthritis   . Clotting disorder   . GERD (gastroesophageal reflux disease)   . Chills   . Fever   . Weight loss   . Hearing loss   . Nasal congestion   . Leg  swelling   . Palpitations   . Abdominal distension   . Abdominal pain   . Nausea & vomiting   . Rectal pain   . Difficulty urinating   . Arthritis pain   . Headache(784.0)   . Weakness   . Bruises easily   . Anemia   . Dizziness   . Numbness and tingling     both hands, and both legs and feet  . Joint pain   . Blood in urine     hx of, none current  . Frequent urination at night   . Pneumonia as child  . Abnormal EKG     hx left bundle branch block on ekg  . Sleep apnea     STOPBANG=5  . Heterozygous factor V Leiden mutation   . Prostatitis, chronic   . Pancreatitis   . Pancreatitis 12/2011  . Arthritis     lower back. Dr. Charlynne Cousins in [place]  . GERD (gastroesophageal reflux disease)   . HTN (hypertension) 01/28/2014  . BPH (benign prostatic hyperplasia) 01/28/2014  . Factor V Leiden 07/19/2013    INTERIM HISTORY: has GERD (gastroesophageal reflux disease); Chronic abdominal pain; Dysphagia, unspecified; Rectal bleeding; Constipation; Pancreatitis; Abnormal LFTs; Hematuria, gross; Diarrhea; Hypokalemia; Cholecystitis; RUQ pain; Factor V Leiden; Pain in limb; HTN (hypertension); BPH (benign prostatic hyperplasia); and Nausea alone on his problem list.    ALLERGIES:  is allergic to reglan.  MEDICATIONS: has a current medication list which includes the following prescription(s): amlodipine, diazepam, esomeprazole, ibuprofen, lidocaine-hydrocortisone ace, methadone, oxycodone, pancrelipase (lip-prot-amyl), phenazopyridine, prochlorperazine, rivaroxaban, silodosin, tizanidine, and zolpidem.  SURGICAL HISTORY:  Past Surgical History  Procedure Laterality  Date  . Neck surgery  1999    C5/C6 fusion, limited neck mobility especially turning to left  . Knee surgery  1980's    arthroscopy, left knee  . Femur fracture surgery  age 88    right leg  . Nerve surgery  1980's    right thumb  . Colonoscopy  01/2011    Dr. Zenon Mayo at [facility]-->normal  . Esophagogastroduodenoscopy   01/2011    Dr. Zenon Mayo at [facility]-->bile reflux in stomach-->patient c/o inadequate conscious sedation (Fentanyl 100mg /Versed 8mg )  . Ercp  07/2011    Dr. Logan Bores [facility]-> pancreatic enterotomy, pancreatic duct stones removed, bile duct not manipulated, no stents  . Cholecystectomy  01/25/2012    Procedure: LAPAROSCOPIC CHOLECYSTECTOMY WITH INTRAOPERATIVE CHOLANGIOGRAM;  Surgeon: Robyne Askew, MD;  Location: [facility];  Service: General;  Laterality: N/A;  . Cystoscopy with hydrodistension and biopsy  01/25/2012    Procedure: CYSTOSCOPY/BIOPSY/HYDRODISTENSION;  Surgeon: Anner Crete, MD;  Location: [facility];  Service: Urology;  Laterality: N/A;  Cystoscopy and Hydrodistension of the Bladder  . 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 Esophagogastroduodenoscopy (egd) with propofol N/A 02/13/2014    [id number] EGD as described above s/p bx (mild changes of reflux on esophageal bx  . Esophageal biopsy N/A 02/13/2014    Procedure: BIOPSY;  Surgeon: Corbin Ade, MD;  Location: [facility];  Service: Endoscopy;  Laterality: N/A;    FAMILY HISTORY: family history includes Cancer in his maternal aunt and maternal grandfather; GI problems in his brother and another family member; Heart attack in his brother; Heart disease in his maternal uncle; Hypertension in his father and mother; Stroke in his brother. There is no history of Colon cancer or Liver disease.  SOCIAL HISTORY:  reports that he quit smoking about 15 months ago. His smoking use included Cigarettes. He has a 35 pack-year smoking history. He has never used smokeless tobacco. He reports that he does not drink alcohol or use illicit drugs.  REVIEW OF SYSTEMS:  Other than that discussed above is noncontributory.  PHYSICAL EXAMINATION: ECOG PERFORMANCE STATUS: 2 - Symptomatic, <50% confined to bed  Blood pressure 146/97, pulse 82, temperature  97.7 F (36.5 C), temperature source Oral, resp. rate 20, weight 209 lb 1.6 oz (94.847 kg), SpO2 96.00%.  GENERAL:alert, no distress and comfortable SKIN: skin color, texture, turgor are normal, no rashes or significant lesions EYES: PERLA; Conjunctiva are pink and non-injected, sclera clear SINUSES: No redness or tenderness over maxillary or ethmoid sinuses OROPHARYNX:no exudate, no erythema on lips, buccal mucosa, or tongue. NECK: supple, thyroid normal size, non-tender, without nodularity. No masses CHEST: Increased AP diameter with no breast masses. LYMPH:  no palpable lymphadenopathy in the cervical, axillary or inguinal LUNGS: clear to auscultation and percussion with normal breathing effort HEART: regular rate & rhythm and no murmurs. ABDOMEN:abdomen soft, non-tender and normal bowel sounds MUSCULOSKELETAL:no cyanosis of digits and no clubbing. Bilateral lower extremity swelling with acrocyanosis and skin breakdown with ulcerations with severe tenderness. Negative Homans sign. NEURO: alert & oriented x 3 with fluent speech, no focal motor/sensory deficits   LABORATORY DATA: Appointment on 03/25/2014  Component Date Value Ref Range Status  . WBC 03/25/2014 8.4  4.0 - 10.5 K/uL Final  . RBC 03/25/2014 4.71  4.22 - 5.81 MIL/uL Final  . Hemoglobin 03/25/2014 13.2  13.0 - 17.0 g/dL Final  . HCT 62/13/0865  38.8* 39.0 - 52.0 % Final  . MCV 03/25/2014 82.4  78.0 - 100.0 fL Final  . MCH 03/25/2014 28.0  26.0 - 34.0 pg Final  . MCHC 03/25/2014 34.0  30.0 - 36.0 g/dL Final  . RDW 75/17/0017 12.9  11.5 - 15.5 % Final  . Platelets 03/25/2014 303  150 - 400 K/uL Final  . Neutrophils Relative % 03/25/2014 54  43 - 77 % Final  . Neutro Abs 03/25/2014 4.6  1.7 - 7.7 K/uL Final  . Lymphocytes Relative 03/25/2014 37  12 - 46 % Final  . Lymphs Abs 03/25/2014 3.1  0.7 - 4.0 K/uL Final  . Monocytes Relative 03/25/2014 6  3 - 12 % Final  . Monocytes Absolute 03/25/2014 0.5  0.1 - 1.0 K/uL Final    . Eosinophils Relative 03/25/2014 3  0 - 5 % Final  . Eosinophils Absolute 03/25/2014 0.2  0.0 - 0.7 K/uL Final  . Basophils Relative 03/25/2014 0  0 - 1 % Final  . Basophils Absolute 03/25/2014 0.0  0.0 - 0.1 K/uL Final  . D-Dimer, Quant 03/25/2014 0.43  0.00 - 0.48 ug/mL-FEU Final   Comment:                                 AT THE INHOUSE ESTABLISHED CUTOFF                          VALUE OF 0.48 ug/mL FEU,                          THIS ASSAY HAS BEEN DOCUMENTED                          IN THE LITERATURE TO HAVE                          A SENSITIVITY AND NEGATIVE                          PREDICTIVE VALUE OF AT LEAST                          98 TO 99%.  THE TEST RESULT                          SHOULD BE CORRELATED WITH                          AN ASSESSMENT OF THE CLINICAL                          PROBABILITY OF DVT / VTE.  Appointment on 02/25/2014  Component Date Value Ref Range Status  . WBC 02/25/2014 8.7  4.0 - 10.5 K/uL Final  . RBC 02/25/2014 4.53  4.22 - 5.81 MIL/uL Final  . Hemoglobin 02/25/2014 12.8* 13.0 - 17.0 g/dL Final  . HCT 49/44/9675 37.5* 39.0 - 52.0 % Final  . MCV 02/25/2014 82.8  78.0 - 100.0 fL Final  . MCH 02/25/2014 28.3  26.0 - 34.0 pg Final  . MCHC 02/25/2014 34.1  30.0 - 36.0 g/dL Final  . RDW 91/63/8466 12.9  11.5 -  15.5 % Final  . Platelets 02/25/2014 324  150 - 400 K/uL Final  . Neutrophils Relative % 02/25/2014 43  43 - 77 % Final  . Neutro Abs 02/25/2014 3.8  1.7 - 7.7 K/uL Final  . Lymphocytes Relative 02/25/2014 47* 12 - 46 % Final  . Lymphs Abs 02/25/2014 4.1* 0.7 - 4.0 K/uL Final  . Monocytes Relative 02/25/2014 6  3 - 12 % Final  . Monocytes Absolute 02/25/2014 0.5  0.1 - 1.0 K/uL Final  . Eosinophils Relative 02/25/2014 4  0 - 5 % Final  . Eosinophils Absolute 02/25/2014 0.3  0.0 - 0.7 K/uL Final  . Basophils Relative 02/25/2014 0  0 - 1 % Final  . Basophils Absolute 02/25/2014 0.0  0.0 - 0.1 K/uL Final  . D-Dimer, Quant 02/25/2014 0.32  0.00  - 0.48 ug/mL-FEU Final   Comment:                                 AT THE INHOUSE ESTABLISHED CUTOFF                          VALUE OF 0.48 ug/mL FEU,                          THIS ASSAY HAS BEEN DOCUMENTED                          IN THE LITERATURE TO HAVE                          A SENSITIVITY AND NEGATIVE                          PREDICTIVE VALUE OF AT LEAST                          98 TO 99%.  THE TEST RESULT                          SHOULD BE CORRELATED WITH                          AN ASSESSMENT OF THE CLINICAL                          PROBABILITY OF DVT / VTE.    PATHOLOGY: No new pathology.  Urinalysis    Component Value Date/Time   COLORURINE YELLOW 01/28/2014 1504   APPEARANCEUR CLEAR 01/28/2014 1504   LABSPEC >1.030* 01/28/2014 1504   PHURINE 5.5 01/28/2014 1504   GLUCOSEU NEGATIVE 01/28/2014 1504   HGBUR NEGATIVE 01/28/2014 1504   BILIRUBINUR NEGATIVE 01/28/2014 1504   KETONESUR NEGATIVE 01/28/2014 1504   PROTEINUR NEGATIVE 01/28/2014 1504   UROBILINOGEN 0.2 01/28/2014 1504   NITRITE NEGATIVE 01/28/2014 1504   LEUKOCYTESUR NEGATIVE 01/28/2014 1504    RADIOGRAPHIC STUDIES: No results found.  ASSESSMENT:  #1. Chronic abdominal pain from previous bouts of pancreatitis with bilateral postphlebitic lower extremity pain.  #2. Factor V Leiden mutation on lifelong Xarelto.  #3. Chronic obstructive pulmonary disease, no longer smoking.  #4. Chronic abdominal pain from previous pancreatitis episodes  #5. Recurrent hematuria,  currently resolved. #6. Bilateral lower extremity varicosities with ulceration    PLAN:  #1. Referral to Dr. Jaynie Collins for consideration of an UNA boot. #2. Continue his current medications and wear TED stockings. #3. Followup in one month with CBC and d-dimer.   All questions were answered. The patient knows to call the clinic with any problems, questions or concerns. We can certainly see the patient much sooner if necessary.   I spent 25  minutes counseling the patient face to face. The total time spent in the appointment was 30 minutes.    Alla German, MD 03/25/2014 2:45 PM  DISCLAIMER:  This note was dictated with voice recognition software.  Similar sounding words can inadvertently be transcribed inaccurately and may not be corrected upon review.

## 2014-03-25 NOTE — Patient Instructions (Signed)
[facility] Discharge Instructions  RECOMMENDATIONS MADE BY THE CONSULTANT AND ANY TEST RESULTS WILL BE SENT TO YOUR REFERRING PHYSICIAN.  We will see you in 1 month for repeat lab work and a doctor's visit. We are referring you to a vascular surgeon about your lower legs.  They will call you with a appointment time. Prescription given for crutches. Medication prescriptions given for Ambien, valium, methadone, and oxycodone.  Thank you for choosing [facility] to provide your oncology and hematology care.  To afford each patient quality time with our providers, please arrive at least 15 minutes before your scheduled appointment time.  With your help, our goal is to use those 15 minutes to complete the necessary work-up to ensure our physicians have the information they need to help with your evaluation and healthcare recommendations.    Effective January 1st, 2014, we ask that you re-schedule your appointment with our physicians should you arrive 10 or more minutes late for your appointment.  We strive to give you quality time with our providers, and arriving late affects you and other patients whose appointments are after yours.    Again, thank you for choosing [facility].  Our hope is that these requests will decrease the amount of time that you wait before being seen by our physicians.       _____________________________________________________________  Should you have questions after your visit to [facility], please contact our office at [phone] between the hours of 8:30 a.m. and 5:00 p.m.  Voicemails left after 4:30 p.m. will not be returned until the following business day.  For prescription refill requests, have your pharmacy contact our office with your prescription refill request.

## 2014-03-28 NOTE — Progress Notes (Signed)
Labs drawn

## 2014-04-02 NOTE — Patient Instructions (Signed)
Alex Hoffman  04/02/2014   Your procedure is scheduled on:  04/10/14  Report to [facility] at 8:15 AM.  Call this number if you have problems the morning of surgery: [phone]   Remember:   Do not eat food or drink liquids after midnight.   Take these medicines the morning of surgery with A SIP OF WATER: Amlodipine, Nexium, Rapiflo, Zanaflex, Valium (either methadone OR oxycodone - whichever relieves pain best)   Do not wear jewelry, make-up or nail polish.  Do not wear lotions, powders, or perfumes. You may wear deodorant.  Do not shave 48 hours prior to surgery. Men may shave face and neck.  Do not bring valuables to the hospital.  [facility] is not responsible for any belongings or valuables.               Contacts, dentures or bridgework may not be worn into surgery.  Leave suitcase in the car. After surgery it may be brought to your room.  For patients admitted to the hospital, discharge time is determined by your treatment team.               Patients discharged the day of surgery will not be allowed to drive home.  Name and phone number of your driver:   Special Instructions: N/A   Please read over the following fact sheets that you were given: Anesthesia Post-op Instructions   PATIENT INSTRUCTIONS POST-ANESTHESIA  IMMEDIATELY FOLLOWING SURGERY:  Do not drive or operate machinery for the first twenty four hours after surgery.  Do not make any important decisions for twenty four hours after surgery or while taking narcotic pain medications or sedatives.  If you develop intractable nausea and vomiting or a severe headache please notify your doctor immediately.  FOLLOW-UP:  Please make an appointment with your surgeon as instructed. You do not need to follow up with anesthesia unless specifically instructed to do so.  WOUND CARE INSTRUCTIONS (if applicable):  Keep a dry clean dressing on the anesthesia/puncture wound site if there is drainage.  Once the wound has quit  draining you may leave it open to air.  Generally you should leave the bandage intact for twenty four hours unless there is drainage.  If the epidural site drains for more than 36-48 hours please call the anesthesia department.  QUESTIONS?:  Please feel free to call your physician or the hospital operator if you have any questions, and they will be happy to assist you.      Esophagogastroduodenoscopy Esophagogastroduodenoscopy (EGD) is a procedure to examine the lining of the esophagus, stomach, and first part of the small intestine (duodenum). A long, flexible, lighted tube with a camera attached (endoscope) is inserted down the throat to view these organs. This procedure is done to detect problems or abnormalities, such as inflammation, bleeding, ulcers, or growths, in order to treat them. The procedure lasts about 5 20 minutes. It is usually an outpatient procedure, but it may need to be performed in emergency cases in the hospital. LET YOUR CAREGIVER KNOW ABOUT:   Allergies to food or medicine.  All medicines you are taking, including vitamins, herbs, eyedrops, and over-the-counter medicines and creams.  Use of steroids (by mouth or creams).  Previous problems you or members of your family have had with the use of anesthetics.  Any blood disorders you have.  Previous surgeries you have had.  Other health problems you have.  Possibility of pregnancy, if this applies. RISKS AND COMPLICATIONS  Generally, EGD is a safe procedure. However, as with any procedure, complications can occur. Possible complications include:  Infection.  Bleeding.  Tearing (perforation) of the esophagus, stomach, or duodenum.  Difficulty breathing or not being able to breath.  Excessive sweating.  Spasms of the larynx.  Slowed heartbeat.  Low blood pressure. BEFORE THE PROCEDURE  Do not eat or drink anything for 6 8 hours before the procedure or as directed by your caregiver.  Ask your caregiver  about changing or stopping your regular medicines.  If you wear dentures, be prepared to remove them before the procedure.  Arrange for someone to drive you home after the procedure. PROCEDURE   A vein will be accessed to give medicines and fluids. A medicine to relax you (sedative) and a pain reliever will be given through that access into the vein.  A numbing medicine (local anesthetic) may be sprayed on your throat for comfort and to stop you from gagging or coughing.  A mouth guard may be placed in your mouth to protect your teeth and to keep you from biting on the endoscope.  You will be asked to lie on your left side.  The endoscope is inserted down your throat and into the esophagus, stomach, and duodenum.  Air is put through the endoscope to allow your caregiver to view the lining of your esophagus clearly.  The esophagus, stomach, and duodenum is then examined. During the exam, your caregiver may:  Remove tissue to be examined under a microscope (biopsy) for inflammation, infection, or other medical problems.  Remove growths.  Remove objects (foreign bodies) that are stuck.  Treat any bleeding with medicines or other devices that stop tissues from bleeding (hot cauters, clipping devices).  Widen (dilate) or stretch narrowed areas of the esophagus and stomach.  The endoscope will then be withdrawn. AFTER THE PROCEDURE  You will be taken to a recovery area to be monitored. You will be able to go home once you are stable and alert.  Do not eat or drink anything until the local anesthetic and numbing medicines have worn off. You may choke.  It is normal to feel bloated, have pain with swallowing, or have a sore throat for a short time. This will wear off.  Your caregiver should be able to discuss his or her findings with you. It will take longer to discuss the test results if any biopsies were taken. Document Released: 02/17/2005 Document Revised: 10/03/2012 Document  Reviewed: 09/19/2012 [facility] Patient Information 2014 [place], [place].

## 2014-04-03 ENCOUNTER — Encounter (HOSPITAL_COMMUNITY)
Admission: RE | Admit: 2014-04-03 | Discharge: 2014-04-03 | Disposition: A | Discharge status: Home / Self Care | Payer: Medicare Other | Source: Ambulatory Visit | Attending: Internal Medicine | Admitting: Internal Medicine

## 2014-04-07 NOTE — Patient Instructions (Signed)
20    Your procedure is scheduled on: 04/10/14  Report to [facility] at  8:15   AM.  Call this number if you have problems the morning of surgery: [phone]   Remember:   Do not drink or eat food:After Midnight.  Hold Xarelto 48 hours    Take these medicines the morning of surgery with A SIP OF WATER: Amlodipine, Nexium   Do not wear jewelry, make-up or nail polish.  Do not wear lotions, powders, or perfumes. You may wear deodorant.  Do not shave 48 hours prior to surgery. Men may shave face and neck.  Do not bring valuables to the hospital.  Contacts, dentures or bridgework may not be worn into surgery.  Leave suitcase in the car. After surgery it may be brought to your room.  For patients admitted to the hospital, checkout time is 11:00 AM the day of discharge.   Patients discharged the day of surgery will not be allowed to drive home.  Name and phone number of your driver:    Please read over the following fact sheets that you were given: Pain Booklet, Lab Information and Anesthesia Post-op Instructions   Endoscopy Care After Please read the instructions outlined below and refer to this sheet in the next few weeks. These discharge instructions provide you with general information on caring for yourself after you leave the hospital. Your doctor may also give you specific instructions. While your treatment has been planned according to the most current medical practices available, unavoidable complications occasionally occur. If you have any problems or questions after discharge, please call your doctor. HOME CARE INSTRUCTIONS Activity  You may resume your regular activity but move at a slower pace for the next 24 hours.   Take frequent rest periods for the next 24 hours.   Walking will help expel (get rid of) the air and reduce the bloated feeling in your abdomen.   No driving for 24 hours (because of the anesthesia (medicine) used during the test).   You may shower.   Do  not sign any important legal documents or operate any machinery for 24 hours (because of the anesthesia used during the test).  Nutrition  Drink plenty of fluids.   You may resume your normal diet.   Begin with a light meal and progress to your normal diet.   Avoid alcoholic beverages for 24 hours or as instructed by your caregiver.  Medications You may resume your normal medications unless your caregiver tells you otherwise. What you can expect today  You may experience abdominal discomfort such as a feeling of fullness or "gas" pains.   You may experience a sore throat for 2 to 3 days. This is normal. Gargling with salt water may help this.  Follow-up Your doctor will discuss the results of your test with you. SEEK IMMEDIATE MEDICAL CARE IF:  You have excessive nausea (feeling sick to your stomach) and/or vomiting.   You have severe abdominal pain and distention (swelling).   You have trouble swallowing.   You have a temperature over 100 F (37.8 C).   You have rectal bleeding or vomiting of blood.  Document Released: 05/31/2004 Document Revised: 10/06/2011 Document Reviewed: 12/12/2007 Esophageal Dilatation The esophagus is the long, narrow tube which carries food and liquid from the mouth to the stomach. Esophageal dilatation is the technique used to stretch a blocked or narrowed portion of the esophagus. This procedure is used when a part of the esophagus has become so  narrow that it becomes difficult, painful or even impossible to swallow. This is generally an uncomplicated form of treatment. When this is not successful, chest surgery may be required. This is a much more extensive form of treatment with a longer recovery time. CAUSES  Some of the more common causes of blockage or strictures of the esophagus are:  Narrowing from longstanding inflammation (soreness and redness) of the lower esophagus. This comes from the constant exposure of the lower esophagus to the acid  which bubbles up from the stomach. Over time this causes scarring and narrowing of the lower esophagus.  Hiatal hernia in which a small part of the stomach bulges (herniates) up through the diaphragm. This can cause a gradual narrowing of the end of the esophagus.  Schatzki's Ring is a narrow ring of benign (non-cancerous) fibrous tissue which constricts the lower esophagus. The reason for this is not known.  Scleroderma is a connective tissue disorder that affects the esophagus and makes swallowing difficult.  Achalasia is an absence of nerves to the lower esophagus and to the esophageal sphincter. This is the circular muscle between the stomach and esophagus that relaxes to allow food into the stomach. After swallowing, it contracts to keep food in the stomach. This absence of nerves may be congenital (present since birth). This can cause irregular spasms of the lower esophageal muscle. This spasm does not open up to allow food and fluid through. The result is a persistent blockage with subsequent slow trickling of the esophageal contents into the stomach.  Strictures may develop from swallowing materials which damage the esophagus. Some examples are strong acids or alkalis such as lye.  Growths such as benign (non-cancerous) and malignant (cancerous) tumors can block the esophagus.  Heredity (present since birth) causes. DIAGNOSIS  Your caregiver often suspects this problem by taking a medical history. They will also do a physical exam. They can then prove their suspicions using X-rays and endoscopy. Endoscopy is an exam in which a tube like a small flexible telescope is used to look at your esophagus.  TREATMENT There are different stretching (dilating) techniques which can be used. Simple bougie dilatation may be done in the office. This usually takes only a couple minutes. A numbing (anesthetic) spray of the throat is used. Endoscopy, when done, is done in an endoscopy suite, under mild  sedation. When fluoroscopy is used, the procedure is performed in X-ray. Other techniques require a little longer time. Recovery is usually quick. There is no waiting time to begin eating and drinking to test success of the treatment. Following are some of the methods used. Narrowing of the esophagus is treated by making it bigger. Commonly this is a mechanical problem which can be treated with stretching. This can be done in different ways. Your caregiver will discuss these with you. Some of the means used are:  A series of graduated (increasing thickness) flexible dilators can be used. These are weighted tubes passed through the esophagus into the stomach. The tubes used become progressively larger until the desired stretched size is reached. Graduated dilators are a simple and quick way of opening the esophagus. No visualization is required.  Another method is the use of endoscopy to place a flexible wire across the stricture. The endoscope is removed and the wire left in place. A dilator with a hole through it from end to end is guided down the esophagus and across the stricture. One or more of these dilators are passed over the wire. At  the end of the exam, the wire is removed. This type of treatment may be performed in the X-ray department under fluoroscopy. An advantage of this procedure is the examiner is visualizing the end opening in the esophagus.  Stretching of the esophagus may be done using balloons. Deflated balloons are placed through the endoscope and across the stricture. This type of balloon dilatation is often done at the time of endoscopy or fluoroscopy. Flexible endoscopy allows the examiner to directly view the stricture. A balloon is inserted in the deflated form into the area of narrowing. It is then inflated with air to a certain pressure that is pre-set for a given circumference. When inflated, it becomes sausage shaped, stretched, and makes the stricture larger.  Achalasia  requires a longer larger balloon-type dilator. This is frequently done under X-ray control. In this situation, the spastic muscle fibers in the lower esophagus are stretched. All of the above procedures make the passage of food and water into the stomach easier. They also make it easier for stomach contents to reflux back into the esophagus. Special medications may be used following the procedure to help prevent further stricturing. Proton-pump inhibitor medications are good at decreasing the amount of acid in the stomach juice. When stomach juice refluxes into the esophagus, the juice is no longer as acidic and is less likely to burn or scar the esophagus. RISKS AND COMPLICATIONS Esophageal dilatation is usually performed effectively and without problems. Some complications that can occur are:  A small amount of bleeding almost always happens where the stretching takes place. If this is too excessive it may require more aggressive treatment.  An uncommon complication is perforation (making a hole) of the esophagus. The esophagus is thin. It is easy to make a hole in it. If this happens, an operation may be necessary to repair this.  A small, undetected perforation could lead to an infection in the chest. This can be very serious. HOME CARE INSTRUCTIONS   If you received sedation for your procedure, do not drive, make important decisions, or perform any activities requiring your full coordination. Do not drink alcohol, take sedatives, or use any mind altering chemicals unless instructed by your caregiver.  You may use throat lozenges or warm salt water gargles if you have throat discomfort  You can begin eating and drinking normally on return home unless instructed otherwise. Do not purposely try to force large chunks of food down to test the benefits of your procedure.  Mild discomfort can be eased with sips of ice water.  Medications for discomfort may or may not be needed. SEEK IMMEDIATE  MEDICAL CARE IF:   You begin vomiting up blood.  You develop black tarry stools  You develop chills or an unexplained temperature of over 101 F (38.3 C)  You develop chest or abdominal pain.  You develop shortness of breath or feel lightheaded or faint.  Your swallowing is becoming more painful, difficult, or you are unable to swallow. MAKE SURE YOU:   Understand these instructions.  Will watch your condition.  Will get help right away if you are not doing well or get worse. Document Released: 12/08/2005 Document Revised: 01/09/2012 Document Reviewed: 01/25/2006 [facility] Patient Information 2014 [place]Care, [place]. Monitored Anesthesia Care  Monitored anesthesia care is an anesthesia service for a medical procedure. Anesthesia is the loss of the ability to feel pain. It is produced by medications called anesthetics. It may affect a small area of your body (local anesthesia), a large area  of your body (regional anesthesia), or your entire body (general anesthesia). The need for monitored anesthesia care depends your procedure, your condition, and the potential need for regional or general anesthesia. It is often provided during procedures where:   General anesthesia may be needed if there are complications. This is because you need special care when you are under general anesthesia.   You will be under local or regional anesthesia. This is so that you are able to have higher levels of anesthesia if needed.   You will receive calming medications (sedatives). This is especially the case if sedatives are given to put you in a semi-conscious state of relaxation (deep sedation). This is because the amount of sedative needed to produce this state can be hard to predict. Too much of a sedative can produce general anesthesia. Monitored anesthesia care is performed by one or more caregivers who have special training in all types of anesthesia. You will need to meet with these caregivers before  your procedure. During this meeting, they will ask you about your medical history. They will also give you instructions to follow. (For example, you will need to stop eating and drinking before your procedure. You may also need to stop or change medications you are taking.) During your procedure, your caregivers will stay with you. They will:   Watch your condition. This includes watching you blood pressure, breathing, and level of pain.   Diagnose and treat problems that occur.   Give medications if they are needed. These may include calming medications (sedatives) and anesthetics.   Make sure you are comfortable.  Having monitored anesthesia care does not necessarily mean that you will be under anesthesia. It does mean that your caregivers will be able to manage anesthesia if you need it or if it occurs. It also means that you will be able to have a different type of anesthesia than you are having if you need it. When your procedure is complete, your caregivers will continue to watch your condition. They will make sure any medications wear off before you are allowed to go home.  Document Released: 07/13/2005 Document Revised: 02/11/2013 Document Reviewed: 11/28/2012 [facility] Patient Information 2014 [place], [place].

## 2014-04-08 ENCOUNTER — Telehealth (HOSPITAL_COMMUNITY): Payer: Self-pay

## 2014-04-08 ENCOUNTER — Encounter (HOSPITAL_COMMUNITY): Payer: Self-pay

## 2014-04-08 ENCOUNTER — Encounter (HOSPITAL_COMMUNITY)
Admission: RE | Admit: 2014-04-08 | Discharge: 2014-04-08 | Disposition: A | Discharge status: Home / Self Care | Payer: Medicare Other | Source: Ambulatory Visit | Attending: Internal Medicine | Admitting: Internal Medicine

## 2014-04-08 DIAGNOSIS — I10 Essential (primary) hypertension: Secondary | ICD-10-CM | POA: Diagnosis not present

## 2014-04-08 DIAGNOSIS — M129 Arthropathy, unspecified: Secondary | ICD-10-CM | POA: Diagnosis not present

## 2014-04-08 DIAGNOSIS — D649 Anemia, unspecified: Secondary | ICD-10-CM | POA: Diagnosis not present

## 2014-04-08 DIAGNOSIS — K219 Gastro-esophageal reflux disease without esophagitis: Secondary | ICD-10-CM | POA: Diagnosis not present

## 2014-04-08 DIAGNOSIS — R1310 Dysphagia, unspecified: Secondary | ICD-10-CM | POA: Diagnosis not present

## 2014-04-08 DIAGNOSIS — Z86711 Personal history of pulmonary embolism: Secondary | ICD-10-CM | POA: Diagnosis not present

## 2014-04-08 DIAGNOSIS — N400 Enlarged prostate without lower urinary tract symptoms: Secondary | ICD-10-CM | POA: Diagnosis not present

## 2014-04-08 DIAGNOSIS — Z79899 Other long term (current) drug therapy: Secondary | ICD-10-CM | POA: Diagnosis not present

## 2014-04-08 DIAGNOSIS — D6859 Other primary thrombophilia: Secondary | ICD-10-CM | POA: Diagnosis not present

## 2014-04-08 DIAGNOSIS — Z888 Allergy status to other drugs, medicaments and biological substances status: Secondary | ICD-10-CM | POA: Diagnosis not present

## 2014-04-08 DIAGNOSIS — G4730 Sleep apnea, unspecified: Secondary | ICD-10-CM | POA: Diagnosis not present

## 2014-04-08 LAB — BASIC METABOLIC PANEL
BUN: 14 mg/dL (ref 6–23)
CO2: 27 meq/L (ref 19–32)
Calcium: 9.2 mg/dL (ref 8.4–10.5)
Chloride: 97 mEq/L (ref 96–112)
Creatinine, Ser: 0.95 mg/dL (ref 0.50–1.35)
GFR calc Af Amer: >90 mL/min (ref >90)
GFR calc non Af Amer: >90 mL/min (ref >90)
Glucose, Bld: 100 mg/dL — ABNORMAL HIGH (ref 70–99)
POTASSIUM: 4.2 meq/L (ref 3.7–5.3)
SODIUM: 136 meq/L — AB (ref 137–147)

## 2014-04-10 ENCOUNTER — Ambulatory Visit (HOSPITAL_COMMUNITY)
Admission: RE | Admit: 2014-04-10 | Discharge: 2014-04-10 | Disposition: A | Discharge status: Home / Self Care | Payer: Medicare Other | Source: Ambulatory Visit | Attending: Internal Medicine | Admitting: Internal Medicine

## 2014-04-10 ENCOUNTER — Ambulatory Visit (HOSPITAL_COMMUNITY): Payer: Medicare Other | Admitting: Anesthesiology

## 2014-04-10 ENCOUNTER — Encounter (HOSPITAL_COMMUNITY): Payer: Medicare Other | Admitting: Anesthesiology

## 2014-04-10 ENCOUNTER — Encounter (HOSPITAL_COMMUNITY): Payer: Self-pay | Admitting: *Deleted

## 2014-04-10 ENCOUNTER — Encounter (HOSPITAL_COMMUNITY)
Admission: RE | Disposition: A | Discharge status: Home / Self Care | Payer: Self-pay | Source: Ambulatory Visit | Attending: Internal Medicine

## 2014-04-10 DIAGNOSIS — I10 Essential (primary) hypertension: Secondary | ICD-10-CM | POA: Diagnosis not present

## 2014-04-10 DIAGNOSIS — K219 Gastro-esophageal reflux disease without esophagitis: Secondary | ICD-10-CM | POA: Insufficient documentation

## 2014-04-10 DIAGNOSIS — Z888 Allergy status to other drugs, medicaments and biological substances status: Secondary | ICD-10-CM | POA: Insufficient documentation

## 2014-04-10 DIAGNOSIS — D6859 Other primary thrombophilia: Secondary | ICD-10-CM | POA: Insufficient documentation

## 2014-04-10 DIAGNOSIS — M129 Arthropathy, unspecified: Secondary | ICD-10-CM | POA: Insufficient documentation

## 2014-04-10 DIAGNOSIS — R1013 Epigastric pain: Secondary | ICD-10-CM | POA: Diagnosis not present

## 2014-04-10 DIAGNOSIS — Z86711 Personal history of pulmonary embolism: Secondary | ICD-10-CM | POA: Insufficient documentation

## 2014-04-10 DIAGNOSIS — N400 Enlarged prostate without lower urinary tract symptoms: Secondary | ICD-10-CM | POA: Insufficient documentation

## 2014-04-10 DIAGNOSIS — R1310 Dysphagia, unspecified: Secondary | ICD-10-CM | POA: Insufficient documentation

## 2014-04-10 DIAGNOSIS — D649 Anemia, unspecified: Secondary | ICD-10-CM | POA: Insufficient documentation

## 2014-04-10 DIAGNOSIS — Z79899 Other long term (current) drug therapy: Secondary | ICD-10-CM | POA: Insufficient documentation

## 2014-04-10 DIAGNOSIS — G4730 Sleep apnea, unspecified: Secondary | ICD-10-CM | POA: Insufficient documentation

## 2014-04-10 HISTORY — PX: ESOPHAGOGASTRODUODENOSCOPY (EGD) WITH PROPOFOL: SHX5813

## 2014-04-10 HISTORY — PX: ESOPHAGEAL DILATION: SHX303

## 2014-04-10 SURGERY — ESOPHAGOGASTRODUODENOSCOPY (EGD) WITH PROPOFOL
Anesthesia: Monitor Anesthesia Care | Site: Esophagus

## 2014-04-10 MED ORDER — PROPOFOL INFUSION 10 MG/ML OPTIME
INTRAVENOUS | Status: DC | PRN
  Administered 2014-04-10: 125 ug/kg/min via INTRAVENOUS

## 2014-04-10 MED ORDER — FENTANYL CITRATE 0.05 MG/ML IJ SOLN
INTRAMUSCULAR | Status: AC
  Filled 2014-04-10: qty 2

## 2014-04-10 MED ORDER — LIDOCAINE VISCOUS 2 % MT SOLN
OROMUCOSAL | Status: AC
  Filled 2014-04-10: qty 15

## 2014-04-10 MED ORDER — ONDANSETRON HCL 4 MG/2ML IJ SOLN
INTRAMUSCULAR | Status: AC
  Filled 2014-04-10: qty 2

## 2014-04-10 MED ORDER — GLYCOPYRROLATE 0.2 MG/ML IJ SOLN
INTRAMUSCULAR | Status: AC
  Filled 2014-04-10: qty 1

## 2014-04-10 MED ORDER — LACTATED RINGERS IV SOLN
INTRAVENOUS | Status: DC
  Administered 2014-04-10: 08:00:00 via INTRAVENOUS

## 2014-04-10 MED ORDER — PROPOFOL 10 MG/ML IV BOLUS
INTRAVENOUS | Status: AC
  Filled 2014-04-10: qty 20

## 2014-04-10 MED ORDER — LIDOCAINE VISCOUS 2 % MT SOLN
OROMUCOSAL | Status: DC | PRN
  Administered 2014-04-10: 4 mL via OROMUCOSAL

## 2014-04-10 MED ORDER — ONDANSETRON HCL 4 MG/2ML IJ SOLN
4.0000 mg | Freq: Once | INTRAMUSCULAR | Status: AC
  Administered 2014-04-10: 4 mg via INTRAVENOUS

## 2014-04-10 MED ORDER — MIDAZOLAM HCL 2 MG/2ML IJ SOLN
1.0000 mg | INTRAMUSCULAR | Status: DC | PRN
  Administered 2014-04-10: 2 mg via INTRAVENOUS

## 2014-04-10 MED ORDER — FENTANYL CITRATE 0.05 MG/ML IJ SOLN: 25.0000 ug | INTRAMUSCULAR | Status: DC | PRN

## 2014-04-10 MED ORDER — FENTANYL CITRATE 0.05 MG/ML IJ SOLN
25.0000 ug | INTRAMUSCULAR | Status: AC
  Administered 2014-04-10 (×2): 25 ug via INTRAVENOUS

## 2014-04-10 MED ORDER — GLYCOPYRROLATE 0.2 MG/ML IJ SOLN
0.2000 mg | Freq: Once | INTRAMUSCULAR | Status: AC
  Administered 2014-04-10: 0.2 mg via INTRAVENOUS

## 2014-04-10 MED ORDER — MIDAZOLAM HCL 2 MG/2ML IJ SOLN
INTRAMUSCULAR | Status: AC
Start: 2014-04-10 — End: 2014-04-10
  Filled 2014-04-10: qty 2

## 2014-04-10 MED ORDER — ONDANSETRON HCL 4 MG/2ML IJ SOLN: 4.0000 mg | Freq: Once | INTRAMUSCULAR | Status: DC | PRN

## 2014-04-10 SURGICAL SUPPLY — 32 items
BALLN CRE LF 10-12 240X5.5 (BALLOONS)
BALLN CRE LF 10-12MM 240X5.5 (BALLOONS)
BALLN DILATOR CRE 12-15 240 (BALLOONS)
BALLN DILATOR CRE 15-18 240 (BALLOONS) IMPLANT
BALLN DILATOR CRE 18-20 240 (BALLOONS) IMPLANT
BALLN DILATOR CRE WIREGUIDE (BALLOONS)
BALLOON CRE LF 10-12 240X5.5 (BALLOONS) IMPLANT
BALLOON DILATOR CRE 12-15 240 (BALLOONS) IMPLANT
BALLOON DILATOR CRE WIREGUIDE (BALLOONS) IMPLANT
BLOCK BITE 60FR ADLT L/F BLUE (MISCELLANEOUS) ×4 IMPLANT
DEVICE CLIP HEMOSTAT 235CM (CLIP) IMPLANT
ELECT REM PT RETURN 9FT ADLT (ELECTROSURGICAL)
ELECTRODE REM PT RTRN 9FT ADLT (ELECTROSURGICAL) IMPLANT
FLOOR PAD 36X40 (MISCELLANEOUS) ×4
FORCEPS BIOP RAD 4 LRG CAP 4 (CUTTING FORCEPS) ×3 IMPLANT
FORMALIN 10 PREFIL 20ML (MISCELLANEOUS) IMPLANT
KIT CLEAN ENDO COMPLIANCE (KITS) ×2 IMPLANT
MANIFOLD NEPTUNE II (INSTRUMENTS) ×2 IMPLANT
NDL SCLEROTHERAPY 25GX240 (NEEDLE) IMPLANT
NEEDLE SCLEROTHERAPY 25GX240 (NEEDLE) IMPLANT
PAD FLOOR 36X40 (MISCELLANEOUS) IMPLANT
PROBE APC STR FIRE (PROBE) IMPLANT
PROBE INJECTION GOLD (MISCELLANEOUS)
PROBE INJECTION GOLD 7FR (MISCELLANEOUS) IMPLANT
SNARE ROTATE MED OVAL 20MM (MISCELLANEOUS) IMPLANT
SNARE SHORT THROW 13M SML OVAL (MISCELLANEOUS) ×4 IMPLANT
SYR 50ML LL SCALE MARK (SYRINGE) ×4 IMPLANT
SYR INFLATE BILIARY GAUGE (MISCELLANEOUS) IMPLANT
SYR INFLATION 60ML (SYRINGE) ×4 IMPLANT
TUBING ENDO SMARTCAP PENTAX (MISCELLANEOUS) IMPLANT
TUBING IRRIGATION ENDOGATOR (MISCELLANEOUS) ×3 IMPLANT
WATER STERILE IRR 1000ML POUR (IV SOLUTION) ×3 IMPLANT

## 2014-04-10 NOTE — Anesthesia Preprocedure Evaluation (Signed)
Anesthesia Evaluation  Patient identified by MRN, date of birth, ID band Patient awake    Reviewed: Allergy & Precautions, H&P , NPO status , Patient's Chart, lab work & pertinent test results  History of Anesthesia Complications Negative for: history of anesthetic complications  Airway Mallampati: II TM Distance: >3 FB     Dental  (+) Teeth Intact, Poor Dentition, Dental Advisory Given   Pulmonary neg pulmonary ROS, sleep apnea , pneumonia -, resolved, Current Smoker, former smoker, PE (Factor V leiden mutation - Xaralto stopped 48hrs) breath sounds clear to auscultation        Cardiovascular hypertension, Pt. on medications Rhythm:Regular Rate:Normal     Neuro/Psych  Headaches,    GI/Hepatic GERD-  Medicated and Controlled,  Endo/Other    Renal/GU      Musculoskeletal   Abdominal   Peds  Hematology   Anesthesia Other Findings   Reproductive/Obstetrics                           Anesthesia Physical Anesthesia Plan  ASA: III  Anesthesia Plan: MAC   Post-op Pain Management:    Induction: Intravenous  Airway Management Planned: Simple Face Mask  Additional Equipment:   Intra-op Plan:   Post-operative Plan:   Informed Consent: I have reviewed the patients History and Physical, chart, labs and discussed the procedure including the risks, benefits and alternatives for the proposed anesthesia with the patient or authorized representative who has indicated his/her understanding and acceptance.     Plan Discussed with:   Anesthesia Plan Comments:         Anesthesia Quick Evaluation

## 2014-04-10 NOTE — Anesthesia Postprocedure Evaluation (Signed)
  Anesthesia Post-op Note  Patient: Alex Hoffman  Procedure(s) Performed: Procedure(s) with comments: ESOPHAGOGASTRODUODENOSCOPY (EGD) WITH PROPOFOL (N/A) - 9:45 -moved to 9:00am [name] to notify pt to arrive at 7:30am ESOPHAGEAL DILATION (N/A) - [address],   Patient Location: PACU  Anesthesia Type:MAC  Level of Consciousness: awake, alert  and oriented  Airway and Oxygen Therapy: Patient Spontanous Breathing and Patient connected to face mask oxygen  Post-op Pain: none  Post-op Assessment: Post-op Vital signs reviewed, Patient's Cardiovascular Status Stable, Respiratory Function Stable, Patent Airway and No signs of Nausea or vomiting  Post-op Vital Signs: Reviewed and stable  Last Vitals:  Filed Vitals:   04/10/14 0938  BP: 113/70  Pulse: 63  Temp: 36.4 C  Resp: 14    Complications: No apparent anesthesia complications

## 2014-04-10 NOTE — Transfer of Care (Signed)
Immediate Anesthesia Transfer of Care Note  Patient: Alex Hoffman  Procedure(s) Performed: Procedure(s) with comments: ESOPHAGOGASTRODUODENOSCOPY (EGD) WITH PROPOFOL (N/A) - 9:45 -moved to 9:00am [name] to notify pt to arrive at 7:30am ESOPHAGEAL DILATION (N/A) - [address],   Patient Location: PACU  Anesthesia Type:MAC  Level of Consciousness: awake  Airway & Oxygen Therapy: Patient Spontanous Breathing  Post-op Assessment: Report given to PACU RN  Post vital signs: Reviewed and stable  Complications: No apparent anesthesia complications

## 2014-04-10 NOTE — Interval H&P Note (Signed)
History and Physical Interval Note:  04/10/2014 9:04 AM  Alex Hoffman  has presented today for surgery, with the diagnosis of RUQ PAIN, EPIGASTRIC PAIN, NAUSEA, DYSPHAGIA, GERD  The various methods of treatment have been discussed with the patient and family. After consideration of risks, benefits and other options for treatment, the patient has consented to  Procedure(s) with comments: ESOPHAGOGASTRODUODENOSCOPY (EGD) WITH PROPOFOL (N/A) - 9:45 -moved to 9:00am Alex Hoffman to notify pt to arrive at 7:30am ESOPHAGEAL DILATION (N/A) as a surgical intervention .  The patient's history has been reviewed, patient examined, no change in status, stable for surgery.  I have reviewed the patient's chart and labs.  Questions were answered to the patient's satisfaction.     No blood thinner in 3 days. No solid food in 2 days. EGD with esophageal dilation, etc. per plan. The risks, benefits, limitations, alternatives and imponderables have been reviewed with the patient. Potential for esophageal dilation, biopsy, etc. have also been reviewed.  Questions have been answered. All parties agreeable.   Alex Hoffman

## 2014-04-10 NOTE — Discharge Instructions (Addendum)
EGD Discharge instructions Please read the instructions outlined below and refer to this sheet in the next few weeks. These discharge instructions provide you with general information on caring for yourself after you leave the hospital. Your doctor may also give you specific instructions. While your treatment has been planned according to the most current medical practices available, unavoidable complications occasionally occur. If you have any problems or questions after discharge, please call your doctor. ACTIVITY  You may resume your regular activity but move at a slower pace for the next 24 hours.   Take frequent rest periods for the next 24 hours.   Walking will help expel (get rid of) the air and reduce the bloated feeling in your abdomen.   No driving for 24 hours (because of the anesthesia (medicine) used during the test).   You may shower.   Do not sign any important legal documents or operate any machinery for 24 hours (because of the anesthesia used during the test).  NUTRITION  Drink plenty of fluids.   You may resume your normal diet.   Begin with a light meal and progress to your normal diet.   Avoid alcoholic beverages for 24 hours or as instructed by your caregiver.  MEDICATIONS  You may resume your normal medications unless your caregiver tells you otherwise.  WHAT YOU CAN EXPECT TODAY  You may experience abdominal discomfort such as a feeling of fullness or gas pains.  FOLLOW-UP  Your doctor will discuss the results of your test with you.  SEEK IMMEDIATE MEDICAL ATTENTION IF ANY OF THE FOLLOWING OCCUR:  Excessive nausea (feeling sick to your stomach) and/or vomiting.   Severe abdominal pain and distention (swelling).   Trouble swallowing.   Temperature over 101 F (37.8 C).   Rectal bleeding or vomiting of blood.    Resume Xarelto today  Continue Nexium twice daily  Continue pancreatic enzymes  OV with [place] in 4 weeks

## 2014-04-10 NOTE — H&P (View-Only) (Signed)
Primary Care Physician:  No primary provider on file.  Primary Gastroenterologist:  Garfield Cornea, MD   Chief Complaint  Patient presents with  . EGD    incomplete    HPI:  Alex Hoffman is a 53 y.o. male here to reschedule his EGD. Attempted EGD on 02/13/2014 however his stomach was full of food. This precluded complete examination of the stomach as well as esophageal dilation. Esophageal biopsy at that time showed changes reflux but no eosinophilic esophagitis. Patient has a history of chronic pancreatitis, GERD, fatty liver, mildly elevated alkaline phosphatase, constipation. EGD was being done for chronic worsening right upper quadrant pain. Chronic constant nausea. Describe pain is debilitating. Also history of constant lower abdominal pain and severe dysuria followed by Dr. Jeffie Pollock. Has rectal pain with bowel movements at times. Bowel movements are regular with Dulcolax every 2-3 days. No melena rectal bleeding. Nausea severe, managed with Compazine. Breakthrough heartburn on Nexium. Chronic dysphagia, not able to eat meat. Pills are hard to swallow.  Current Outpatient Prescriptions  Medication Sig Dispense Refill  . amLODipine (NORVASC) 10 MG tablet Take 1 tablet (10 mg total) by mouth every morning.  30 tablet  3  . diazepam (VALIUM) 5 MG tablet Take 1 tablet (5 mg total) by mouth every 6 (six) hours as needed for anxiety.  30 tablet  2  . esomeprazole (NEXIUM) 40 MG capsule Take 1 capsule (40 mg total) by mouth 2 (two) times daily. For indigestion  60 capsule  11  . ibuprofen (ADVIL,MOTRIN) 200 MG tablet Take 400 mg by mouth 2 (two) times daily as needed for moderate pain.      . Lidocaine-Hydrocortisone Ace 3-0.5 % KIT Place 1 application rectally 2 (two) times daily.  30 each  0  . methadone (DOLOPHINE) 10 MG tablet Take 5 tablets (50 mg total) by mouth every 8 (eight) hours.  450 tablet  0  . oxycodone (ROXICODONE) 30 MG immediate release tablet Take 1 tablet (30 mg total) by mouth  every 4 (four) hours as needed for pain.  100 tablet  0  . Pancrelipase, Lip-Prot-Amyl, (ZENPEP) 40000 UNITS CPEP 3 capsules with meals and 2 with snacks.  400 capsule  5  . phenazopyridine (PYRIDIUM) 100 MG tablet Take 100 mg by mouth 2 (two) times daily as needed for pain.       Marland Kitchen prochlorperazine (COMPAZINE) 10 MG tablet Take 5-10 mg by mouth 3 (three) times daily.       . Rivaroxaban (XARELTO) 20 MG TABS tablet Take 1 tablet (20 mg total) by mouth daily with supper.  30 tablet  12  . silodosin (RAPAFLO) 8 MG CAPS capsule Take 1 capsule (8 mg total) by mouth daily with breakfast.  30 capsule  3  . tiZANidine (ZANAFLEX) 4 MG capsule Take 1 capsule up to 4 times a day for spasms.  120 capsule  3  . zolpidem (AMBIEN) 5 MG tablet Take 1 tablet (5 mg total) by mouth at bedtime as needed for sleep.  30 tablet  3   No current facility-administered medications for this visit.    Allergies as of 03/18/2014 - Review Complete 03/18/2014  Allergen Reaction Noted  . Reglan [metoclopramide]  08/09/2013    Past Medical History  Diagnosis Date  . Pulmonary embolism 12/2010  . Cystic thyroid nodule     left  . HTN (hypertension)   . Heterozygous factor V Leiden mutation 12/2010  . Pancreatitis 05/2011    EUS Dr Ardis Hughs 06/09/11->  dilated main pancreatic duct in HOP, 2-3 filling defects distal pancreatic duct, peripancreatic fluid resolved  . C. difficile colitis 01/2011?  Marland Kitchen Arthritis   . Clotting disorder   . GERD (gastroesophageal reflux disease)   . Chills   . Fever   . Weight loss   . Hearing loss   . Nasal congestion   . Leg swelling   . Palpitations   . Abdominal distension   . Abdominal pain   . Nausea & vomiting   . Rectal pain   . Difficulty urinating   . Arthritis pain   . Headache(784.0)   . Weakness   . Bruises easily   . Anemia   . Dizziness   . Numbness and tingling     both hands, and both legs and feet  . Joint pain   . Blood in urine     hx of, none current  . Frequent  urination at night   . Pneumonia as child  . Abnormal EKG     hx left bundle branch block on ekg  . Sleep apnea     STOPBANG=5  . Heterozygous factor V Leiden mutation   . Prostatitis, chronic   . Pancreatitis   . Pancreatitis 12/2011  . Arthritis     lower back. Dr. Effie Berkshire in [place]  . GERD (gastroesophageal reflux disease)   . HTN (hypertension) 01/28/2014  . BPH (benign prostatic hyperplasia) 01/28/2014  . Factor V Leiden 07/19/2013    Past Surgical History  Procedure Laterality Date  . Neck surgery  1999    C5/C6 fusion, limited neck mobility especially turning to left  . Knee surgery  1980's    arthroscopy, left knee  . Femur fracture surgery  age 13    right leg  . Nerve surgery  1980's    right thumb  . Colonoscopy  01/2011    Dr. Annitta Jersey at [facility]-->normal  . Esophagogastroduodenoscopy  01/2011    Dr. Annitta Jersey at [facility]-->bile reflux in stomach-->patient c/o inadequate conscious sedation (Fentanyl 186m/Versed 873m  . [facility]  07[facility]    [facility] enter[facility], pancreatic duct stones removed, bile duct not manipulated, no stents  . Cholecystectomy  01/25/2012    Procedure: LAPAROSCOPIC CHOLECYSTECTOMY WITH INTRAOPERATIVE CHOLANGIOGRAM;  Surgeon: PaMerrie RoofMD;  Location: [facility];  Service: General;  Laterality: N/A;  . Cystoscopy with hydrodistension and biopsy  01/25/2012    Procedure: CYSTOSCOPY/BIOPSY/HYDRODISTENSION;  Surgeon: JoMalka SoMD;  Location: [facility];  Service: Urology;  Laterality: N/A;  Cystoscopy and Hydrodistension of the Bladder  . 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 Esophagogastroduodenoscopy (egd) with propofol N/A 02/13/2014    [id number] as described above s/p bx (mild changes of reflux on esophageal bx  . Esophageal biopsy N/A 02/13/2014    Procedure: BIOPSY;  Surgeon: RoDaneil DolinMD;  Location: [facility];   Service: Endoscopy;  Laterality: N/A;    Family History  Problem Relation Age of Onset  . GI problems Brother   . Colon cancer Neg Hx   . Liver disease Neg Hx   . GI problems      stomach and lung cancer, aunts/uncles  . Heart attack Brother     before age of 55. Stroke Brother   . Hypertension Mother   . Hypertension Father   . Cancer Maternal Aunt  colon/pancreas/lung/stomach  . Heart disease Maternal Uncle   . Cancer Maternal Grandfather     lung    History   Social History  . Marital Status: Married    Spouse Name: N/A    Number of Children: 2  . Years of Education: N/A   Occupational History  .      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 Smoking status: Former Smoker -- 1.00 packs/day for 35 years    Types: Cigarettes    Quit date: 12/21/2012  . Smokeless tobacco: Never Used     Comment: About 2 cigarettes per day.   . Alcohol Use: No     Comment: No alcohol since January 27, 2011  . Drug Use: No  . Sexual Activity: Not on file   Other Topics Concern  . Not on file   Social History Narrative  . No narrative on file      ROS:  General: Negative for anorexia, weight loss, fever, chills, fatigue, weakness. Eyes: Negative for vision changes.  ENT: Negative for hoarseness,   nasal congestion. See hpi. CV: Negative for chest pain, angina, palpitations, dyspnea on exertion, peripheral edema.  Respiratory: Negative for dyspnea at rest, dyspnea on exertion, cough, sputum, wheezing.  GI: See history of present illness. GU:  Negative for hematuria, urinary incontinence, urinary frequency, nocturnal urination. Chronic dysuria followed by Dr. Jeffie Pollock. MS: Negative for joint pain, low back pain.  Derm: Negative for rash or itching.  Neuro: Negative for weakness, abnormal sensation, seizure, frequent headaches, memory loss, confusion.  Psych: Negative for anxiety, depression, suicidal  ideation, hallucinations.  Endo: Negative for unusual weight change.  Heme: Negative for bruising or bleeding. Allergy: Negative for rash or hives.    Physical Examination:  BP 110/69  Pulse 49  Temp(Src) 97.4 F (36.3 C) (Oral)  Ht 6' (1.829 m)  Wt 206 lb (93.441 kg)  BMI 27.93 kg/m2   General: Well-nourished, well-developed in no acute distress.  Head: Normocephalic, atraumatic.   Eyes: Conjunctiva pink, no icterus. Mouth: Oropharyngeal mucosa moist and pink , no lesions erythema or exudate. Dentition in poor repair. Neck: Supple without thyromegaly, masses, or lymphadenopathy.  Lungs: Clear to auscultation bilaterally.  Heart: Regular rate and rhythm, no murmurs rubs or gallops.  Abdomen: Bowel sounds are normal, soft. Patient requested I not palpate abdomen because it usually exacerbates his chronic pain and currently he is pain free.   Rectal: not performed Extremities: No lower extremity edema. No clubbing or deformities.  Neuro: Alert and oriented x 4 , grossly normal neurologically.  Skin: Warm and dry, no rash or jaundice.   Psych: Alert and cooperative, normal mood and affect.  Labs: Lab Results  Component Value Date   WBC 8.7 02/25/2014   HGB 12.8* 02/25/2014   HCT 37.5* 02/25/2014   MCV 82.8 02/25/2014   PLT 324 02/25/2014     Imaging Studies: No results found.

## 2014-04-10 NOTE — Op Note (Signed)
[facility] [address] [place], [address]   ENDOSCOPY PROCEDURE REPORT  PATIENT: Alex Hoffman, Alex Hoffman  MR#: [id number] BIRTHDATE: August 12, 1961 , 52  yrs. old GENDER: Male ENDOSCOPIST: R.  Roetta Sessions, MD FACP FACG REFERRED BY:     none PROCEDURE DATE:  04/10/2014 PROCEDURE:    EGD with Elease Hashimoto dilation  INDICATIONS:    Esophageal dysphagia; the biopsies previously negative for eosinophilic esophagitis  INFORMED CONSENT:   The risks, benefits, limitations, alternatives and imponderables have been discussed.  The potential for biopsy, esophogeal dilation, etc. have also been reviewed.  Questions have been answered.  All parties agreeable.  Please see the history and physical in the medical record for more information.  MEDICATIONS:    deep sedation per Dr. [facility]  DESCRIPTION OF PROCEDURE:   The     endoscope was introduced through the mouth and advanced to the second portion of the duodenum without difficulty or limitations.  The mucosal surfaces were surveyed very carefully during advancement of the scope and upon withdrawal.  Retroflexion view of the proximal stomach and esophagogastric junction was performed.      FINDINGS: Esophagus appears normal. It was patent throughout its course. Stomach empty. Patulous EG junction. Normal gastric mucosa. Patent pylorus. Normal first and second portion of the duodenum  THERAPEUTIC / DIAGNOSTIC MANEUVERS PERFORMED:  A 56 French Maloney dilator was passed to full insertion easily. A look back revealed no apparent complication related to this maneuver.   COMPLICATIONS:  None  IMPRESSION:  Patulous EG junction; otherwise normal EGD; status post passage of a Maloney dilator  RECOMMENDATIONS:  Continue twice a day PPI; continue pancreatic enzymes. Resume Xarelto  alternate today  Office visit 4 weeks.    _______________________________ R. Roetta Sessions, MD FACP [facility] eSigned:  R. Roetta Sessions, MD FACP [facility] 04/10/2014 9:54 AM     CC:

## 2014-04-11 ENCOUNTER — Encounter (HOSPITAL_COMMUNITY): Payer: Self-pay | Admitting: Internal Medicine

## 2014-04-25 ENCOUNTER — Encounter (HOSPITAL_COMMUNITY): Payer: Self-pay

## 2014-04-25 ENCOUNTER — Other Ambulatory Visit (HOSPITAL_COMMUNITY): Payer: Medicare Other

## 2014-04-25 ENCOUNTER — Encounter (HOSPITAL_COMMUNITY): Payer: Medicare Other | Attending: Hematology and Oncology

## 2014-04-25 ENCOUNTER — Encounter (HOSPITAL_COMMUNITY): Payer: Medicare Other

## 2014-04-25 VITALS — BP 158/89 | HR 84 | Temp 97.8°F | Resp 18 | Wt 212.7 lb

## 2014-04-25 DIAGNOSIS — L97909 Non-pressure chronic ulcer of unspecified part of unspecified lower leg with unspecified severity: Secondary | ICD-10-CM | POA: Insufficient documentation

## 2014-04-25 DIAGNOSIS — Z7901 Long term (current) use of anticoagulants: Secondary | ICD-10-CM | POA: Diagnosis not present

## 2014-04-25 DIAGNOSIS — L97911 Non-pressure chronic ulcer of unspecified part of right lower leg limited to breakdown of skin: Secondary | ICD-10-CM

## 2014-04-25 DIAGNOSIS — L97921 Non-pressure chronic ulcer of unspecified part of left lower leg limited to breakdown of skin: Secondary | ICD-10-CM

## 2014-04-25 DIAGNOSIS — D6859 Other primary thrombophilia: Secondary | ICD-10-CM

## 2014-04-25 DIAGNOSIS — G4700 Insomnia, unspecified: Secondary | ICD-10-CM | POA: Diagnosis not present

## 2014-04-25 DIAGNOSIS — J449 Chronic obstructive pulmonary disease, unspecified: Secondary | ICD-10-CM | POA: Diagnosis not present

## 2014-04-25 DIAGNOSIS — R109 Unspecified abdominal pain: Secondary | ICD-10-CM

## 2014-04-25 DIAGNOSIS — G8929 Other chronic pain: Secondary | ICD-10-CM

## 2014-04-25 DIAGNOSIS — K861 Other chronic pancreatitis: Secondary | ICD-10-CM

## 2014-04-25 DIAGNOSIS — I87009 Postthrombotic syndrome without complications of unspecified extremity: Secondary | ICD-10-CM

## 2014-04-25 DIAGNOSIS — J439 Emphysema, unspecified: Secondary | ICD-10-CM

## 2014-04-25 DIAGNOSIS — J4489 Other specified chronic obstructive pulmonary disease: Secondary | ICD-10-CM

## 2014-04-25 LAB — CBC WITH DIFFERENTIAL/PLATELET
BASOS PCT: 0 % (ref 0–1)
Basophils Absolute: 0 10*3/uL (ref 0.0–0.1)
EOS ABS: 0.2 10*3/uL (ref 0.0–0.7)
Eosinophils Relative: 2 % (ref 0–5)
HEMATOCRIT: 38.8 % — AB (ref 39.0–52.0)
HEMOGLOBIN: 13.4 g/dL (ref 13.0–17.0)
Lymphocytes Relative: 44 % (ref 12–46)
Lymphs Abs: 3.3 10*3/uL (ref 0.7–4.0)
MCH: 28.4 pg (ref 26.0–34.0)
MCHC: 34.5 g/dL (ref 30.0–36.0)
MCV: 82.2 fL (ref 78.0–100.0)
MONO ABS: 0.5 10*3/uL (ref 0.1–1.0)
MONOS PCT: 6 % (ref 3–12)
Neutro Abs: 3.6 10*3/uL (ref 1.7–7.7)
Neutrophils Relative %: 48 % (ref 43–77)
Platelets: 290 10*3/uL (ref 150–400)
RBC: 4.72 MIL/uL (ref 4.22–5.81)
RDW: 12.5 % (ref 11.5–15.5)
WBC: 7.6 10*3/uL (ref 4.0–10.5)

## 2014-04-25 LAB — D-DIMER, QUANTITATIVE: D-Dimer, Quant: 0.33 ug/mL-FEU (ref 0.00–0.48)

## 2014-04-25 MED ORDER — OXYCODONE HCL 30 MG PO TABS: 30.0000 mg | ORAL_TABLET | ORAL | Status: DC | PRN

## 2014-04-25 MED ORDER — METHADONE HCL 10 MG PO TABS: 50.0000 mg | ORAL_TABLET | Freq: Three times a day (TID) | ORAL | Status: DC

## 2014-04-25 NOTE — Progress Notes (Signed)
Labs drawn for cbcd ddim

## 2014-04-25 NOTE — Progress Notes (Signed)
Alex Hoffman  OFFICE PROGRESS NOTE  Alex Bradford, MD [place] [address]  DIAGNOSIS: Hypercoagulable state - Plan: CBC with Differential, D-dimer, quantitative  Other chronic pancreatitis  Pulmonary emphysema, unspecified emphysema type  Postphlebitic syndrome  Chief Complaint  Patient presents with  . Chronic pancreatic pain  . Thrombophilia    CURRENT THERAPY: Methadone, oxycodone, Xarelto, Nexium, pancreatic enzymes.  INTERVAL HISTORY: Alex Hoffman 53 y.o. male returns for followup of thrombophilia and chronic pancreatic abdominal pain, status post aborted EGD on 02/13/2014 because of retained food. Second attempt was performed on 04/10/2014 revealed a patulous EG junction, status post passage of a Maloney dilator with directions to take twice daily PPI and to resume Xarelto and pancreatic enzyme therapy. He is not knows very much difference in his swallowing. He does get nauseated on occasion but has had no vomiting, hematemesis, melena, hematochezia, hematuria, or epistaxis. Pain is controlled with current analgesic regimen. He is very happy to have an appointment with a vascular surgeon next week.  MEDICAL HISTORY: Past Medical History  Diagnosis Date  . Pulmonary embolism 12/2010  . Cystic thyroid nodule     left  . HTN (hypertension)   . Heterozygous factor V Leiden mutation 12/2010  . Pancreatitis 05/2011    EUS Dr Ardis Hughs 06/09/11-> dilated main pancreatic duct in HOP, 2-3 filling defects distal pancreatic duct, peripancreatic fluid resolved  . C. difficile colitis 01/2011?  Marland Kitchen Arthritis   . Clotting disorder   . GERD (gastroesophageal reflux disease)   . Chills   . Fever   . Weight loss   . Hearing loss   . Nasal congestion   . Leg swelling   . Palpitations   . Abdominal distension   . Abdominal pain   . Nausea & vomiting   . Rectal pain   . Difficulty urinating   . Arthritis pain   .  Headache(784.0)   . Weakness   . Bruises easily   . Anemia   . Dizziness   . Numbness and tingling     both hands, and both legs and feet  . Joint pain   . Blood in urine     hx of, none current  . Frequent urination at night   . Pneumonia as child  . Abnormal EKG     hx left bundle branch block on ekg  . Sleep apnea     STOPBANG=5  . Heterozygous factor V Leiden mutation   . Prostatitis, chronic   . Pancreatitis   . Pancreatitis 12/2011  . Arthritis     lower back. Dr. Effie Berkshire in [place]  . GERD (gastroesophageal reflux disease)   . HTN (hypertension) 01/28/2014  . BPH (benign prostatic hyperplasia) 01/28/2014  . Factor V Leiden 07/19/2013    INTERIM HISTORY: has GERD (gastroesophageal reflux disease); Chronic abdominal pain; Dysphagia, unspecified; Rectal bleeding; Constipation; Pancreatitis; Abnormal LFTs; Hematuria, gross; Diarrhea; Hypokalemia; Cholecystitis; RUQ pain; Factor V Leiden; Pain in limb; HTN (hypertension); BPH (benign prostatic hyperplasia); and Nausea alone on his problem list.    ALLERGIES:  is allergic to reglan.  MEDICATIONS: has a current medication list which includes the following prescription(s): amlodipine, diazepam, esomeprazole, ibuprofen, lidocaine-hydrocortisone ace, methadone, oxycodone, pancrelipase (lip-prot-amyl), phenazopyridine, prochlorperazine, rivaroxaban, silodosin, tizanidine, and zolpidem.  SURGICAL HISTORY:  Past Surgical History  Procedure Laterality Date  . Neck surgery  1999    C5/C6 fusion, limited neck mobility especially turning to  left  . Knee surgery  1980's    arthroscopy, left knee  . Femur fracture surgery  age 57    right leg  . Nerve surgery  1980's    right thumb  . Colonoscopy  01/2011    Dr. Annitta Jersey at [facility]-->normal  . Esophagogastroduodenoscopy  01/2011    Dr. Annitta Jersey at [facility]-->bile reflux in stomach-->patient c/o inadequate conscious sedation (Fentanyl 100mg /Versed 8mg )  . Ercp  07/2011    Dr. Amalia Hailey  [facility]-> pancreatic enterotomy, pancreatic duct stones removed, bile duct not manipulated, no stents  . Cholecystectomy  01/25/2012    Procedure: LAPAROSCOPIC CHOLECYSTECTOMY WITH INTRAOPERATIVE CHOLANGIOGRAM;  Surgeon: Merrie Roof, MD;  Location: [facility];  Service: General;  Laterality: N/A;  . Cystoscopy with hydrodistension and biopsy  01/25/2012    Procedure: CYSTOSCOPY/BIOPSY/HYDRODISTENSION;  Surgeon: Malka So, MD;  Location: [facility];  Service: Urology;  Laterality: N/A;  Cystoscopy and Hydrodistension of the Bladder  . Colonoscopy with propofol  11/22/2012    XNA:TFTDDUK anal rectum/fissure not identified although he certainly could have a chronic anal fissure   . Esophagogastroduodenoscopy (egd) with propofol N/A 02/13/2014    GUR:KYHCWCBJSE EGD as described above s/p bx (mild changes of reflux on esophageal bx  . Esophageal biopsy N/A 02/13/2014    Procedure: BIOPSY;  Surgeon: Daneil Dolin, MD;  Location: [facility];  Service: Endoscopy;  Laterality: N/A;  . Esophagogastroduodenoscopy (egd) with propofol N/A 04/10/2014    Procedure: ESOPHAGOGASTRODUODENOSCOPY (EGD) WITH PROPOFOL;  Surgeon: Daneil Dolin, MD;  Location: [facility];  Service: Endoscopy;  Laterality: N/A;  9:45 -moved to 9:00am Darius Bump to notify pt to arrive at 7:30am  . Esophageal dilation N/A 04/10/2014    Procedure: ESOPHAGEAL DILATION;  Surgeon: Daneil Dolin, MD;  Location: [facility];  Service: Endoscopy;  Laterality: N/A;  [place] 59,     FAMILY HISTORY: family history includes Cancer in his maternal aunt and maternal grandfather; GI problems in his brother and another family member; Heart attack in his brother; Heart disease in his maternal uncle; Hypertension in his father and mother; Stroke in his brother. There is no history of Colon cancer or Liver disease.  SOCIAL HISTORY:  reports that he quit smoking about 16 months ago. His smoking use included Cigarettes. He has a 35 pack-year smoking history. He has never used  smokeless tobacco. He reports that he does not drink alcohol or use illicit drugs.  REVIEW OF SYSTEMS:  Other than that discussed above is noncontributory.  PHYSICAL EXAMINATION: ECOG PERFORMANCE STATUS: 1 - Symptomatic but completely ambulatory  Blood pressure 158/89, pulse 84, temperature 97.8 F (36.6 C), temperature source Oral, resp. rate 18, weight 212 lb 11.2 oz (96.48 kg), SpO2 100.00%.  GENERAL:alert, no distress and comfortable SKIN: skin color, texture, turgor are normal, no rashes or significant lesions EYES: PERLA; Conjunctiva are pink and non-injected, sclera clear SINUSES: No redness or tenderness over maxillary or ethmoid sinuses OROPHARYNX:no exudate, no erythema on lips, buccal mucosa, or tongue. NECK: supple, thyroid normal size, non-tender, without nodularity. No masses CHEST: Increased AP diameter with no gynecomastia. LYMPH:  no palpable lymphadenopathy in the cervical, axillary or inguinal LUNGS: clear to auscultation and percussion with normal breathing effort HEART: regular rate & rhythm and no murmurs. ABDOMEN:abdomen soft, non-tender and normal bowel sounds MUSCULOSKELETAL:no cyanosis of digits and no clubbing. Range of motion normal. Bilateral lower  extremity varicosities with tenderness on palpation but negative Homans sign. Bilateral +1 edema of both lower  extremities. NEURO: alert & oriented x 3 with fluent speech, no focal motor/sensory deficits   LABORATORY DATA: Lab on 04/25/2014  Component Date Value Ref Range Status  . WBC 04/25/2014 7.6  4.0 - 10.5 K/uL Final  . RBC 04/25/2014 4.72  4.22 - 5.81 MIL/uL Final  . Hemoglobin 04/25/2014 13.4  13.0 - 17.0 g/dL Final  . HCT 04/25/2014 38.8* 39.0 - 52.0 % Final  . MCV 04/25/2014 82.2  78.0 - 100.0 fL Final  . MCH 04/25/2014 28.4  26.0 - 34.0 pg Final  . MCHC 04/25/2014 34.5  30.0 - 36.0 g/dL Final  . RDW 04/25/2014 12.5  11.5 - 15.5 % Final  . Platelets 04/25/2014 290  150 - 400 K/uL Final  .  Neutrophils Relative % 04/25/2014 48  43 - 77 % Final  . Neutro Abs 04/25/2014 3.6  1.7 - 7.7 K/uL Final  . Lymphocytes Relative 04/25/2014 44  12 - 46 % Final  . Lymphs Abs 04/25/2014 3.3  0.7 - 4.0 K/uL Final  . Monocytes Relative 04/25/2014 6  3 - 12 % Final  . Monocytes Absolute 04/25/2014 0.5  0.1 - 1.0 K/uL Final  . Eosinophils Relative 04/25/2014 2  0 - 5 % Final  . Eosinophils Absolute 04/25/2014 0.2  0.0 - 0.7 K/uL Final  . Basophils Relative 04/25/2014 0  0 - 1 % Final  . Basophils Absolute 04/25/2014 0.0  0.0 - 0.1 K/uL Final  . D-Dimer, Quant 04/25/2014 0.33  0.00 - 0.48 ug/mL-FEU Final   Comment:                                 AT THE INHOUSE ESTABLISHED CUTOFF                          VALUE OF 0.48 ug/mL FEU,                          THIS ASSAY HAS BEEN DOCUMENTED                          IN THE LITERATURE TO HAVE                          A SENSITIVITY AND NEGATIVE                          PREDICTIVE VALUE OF AT LEAST                          98 TO 99%.  THE TEST RESULT                          SHOULD BE CORRELATED WITH                          AN ASSESSMENT OF THE CLINICAL                          PROBABILITY OF DVT / VTE.  Hospital Outpatient Visit on 04/08/2014  Component Date Value Ref Range Status  . Sodium 04/08/2014 136* 137 - 147 mEq/Hoffman Final  . Potassium 04/08/2014 4.2  3.7 -  5.3 mEq/Hoffman Final  . Chloride 04/08/2014 97  96 - 112 mEq/Hoffman Final  . CO2 04/08/2014 27  19 - 32 mEq/Hoffman Final  . Glucose, Bld 04/08/2014 100* 70 - 99 mg/dL Final  . BUN 42/94/2499 14  6 - 23 mg/dL Final  . Creatinine, Ser 04/08/2014 0.95  0.50 - 1.35 mg/dL Final  . Calcium 84/89/2449 9.2  8.4 - 10.5 mg/dL Final  . GFR calc non Af Amer 04/08/2014 >90  >90 mL/min Final  . GFR calc Af Amer 04/08/2014 >90  >90 mL/min Final   Comment: (NOTE)                          The eGFR has been calculated using the CKD EPI equation.                          This calculation has not been validated in all  clinical situations.                          eGFR's persistently <90 mL/min signify possible Chronic Kidney                          Disease.    PATHOLOGY:   for Alex Hoffman, Alex Hoffman ([id number]) Patient: Alex Hoffman, Alex Hoffman Collected: 02/13/2014 Client: [facility] Accession: [id number] Received: 02/13/2014 Alex Hoffman DOB: Feb 26, 1961 Age: 1 Gender: M Reported: 02/14/2014 [address] Patient Ph: [phone] MRN #: [id number] [place] [address] Visit #: [id number] Chart #: Phone: [phone] Fax: CC: REPORT OF SURGICAL PATHOLOGY FINAL DIAGNOSIS Diagnosis Esophagus, biopsy - BENIGN SQUAMOUS MUCOSA WITH MILD CHANGES OF REFLUX. NO FEATURES OF EOSINOPHILIC ESOPHAGITIS (LESS THAN FIVE EOSINOPHILS PER HIGH POWER FIELD), DYSPLASIA, OR MALIGNANCY. Alex Picket MD Pathologist, Electronic Signature (Case signed 02/14/2014) Specimen Gross and Clinical Information Specimen(s) Obtained: Esophagus, biopsy Specimen Clinical Information Pre-op: abdominal pain, nausea, esophageal dysphagia and GERD; Post-op: incomplete EGD; food in stomach; esophageal biopsy Gross Received in formalin are tan, soft tissue fragments that are submitted in toto. Number: five pieces, Size: 0.2 to 0.3 cm. (GRP:ecj 02/13/2014) Stain(s) used in Diagnosis: The following stain(s) were used in diagnosing the case: Warthin-Starry Stain. The control(s) stained appropriately. Report signed out from the following location(s) Technical Component performed at [facility] [address],[place], [place] [address] CLIA: [id number]., Technical Component performed at [facility] [address], [place], [place] [address]. CLIA #: [id number], Interpretation performed at [facility]   Urinalysis    Component Value Date/Time   COLORURINE YELLOW 01/28/2014 1504   APPEARANCEUR CLEAR 01/28/2014 1504   LABSPEC >1.030* 01/28/2014 1504   PHURINE 5.5 01/28/2014 1504   GLUCOSEU NEGATIVE 01/28/2014 1504    HGBUR NEGATIVE 01/28/2014 1504   BILIRUBINUR NEGATIVE 01/28/2014 1504   KETONESUR NEGATIVE 01/28/2014 1504   PROTEINUR NEGATIVE 01/28/2014 1504   UROBILINOGEN 0.2 01/28/2014 1504   NITRITE NEGATIVE 01/28/2014 1504   LEUKOCYTESUR NEGATIVE 01/28/2014 1504    ENDOCSCOPIC FINDINGS: [facility] [address] [place], [address] ENDOSCOPY PROCEDURE REPORT PATIENT: Alex Hoffman, Alex Hoffman MR#: [id number] BIRTHDATE: December 29, 1960 , 52 yrs. old GENDER: Male ENDOSCOPIST: R. Roetta Sessions, MD FACP FACG REFERRED BY: none PROCEDURE DATE: 04/10/2014 PROCEDURE: EGD with Elease Hashimoto dilation INDICATIONS: Esophageal dysphagia; the biopsies previously negative for eosinophilic esophagitis INFORMED CONSENT: The risks, benefits, limitations, alternatives and imponderables have been discussed. The potential for  biopsy, esophogeal dilation, etc. have also been reviewed. Questions have been answered. All parties agreeable. Please see the history and physical in the medical record for more information. MEDICATIONS: deep sedation per Dr[facility] DESCRIPTION OF PROCEDURE: The endoscope was introduced through the mouth and advanced to the second portion of the duodenum without difficulty or limitations. The mucosal surfaces were surveyed very carefully during advancement of the scope and upon withdrawal. Retroflexion view of the proximal stomach and esophagogastric junction was performed. FINDINGS: Esophagus appears normal. It was patent throughout its course. Stomach empty. Patulous EG junction. Normal gastric mucosa. Patent pylorus. Normal first and second portion of the duodenum THERAPEUTIC / DIAGNOSTIC MANEUVERS PERFORMED: A 56 French Maloney dilator was passed to full insertion easily. A look back revealed no apparent complication related to this maneuver. COMPLICATIONS: None IMPRESSION: Patulous EG junction; otherwise normal EGD; status post passage of a Maloney dilator RECOMMENDATIONS:  Continue twice a day PPI; continue pancreatic enzymes. Resume Xarelto alternate today Office visit 4 weeks. _______________________________ R. Garfield Cornea, MD FACP [facility] eSigned: R. Garfield Cornea, MD FACP [facility] 04/10/2014   ASSESSMENT:  #1. Chronic abdominal pain from previous bouts of pancreatitis with bilateral postphlebitic lower extremity pain.  #2. Factor V Leiden mutation on lifelong Xarelto.  #3. Chronic obstructive pulmonary disease, no longer smoking.  #4. Chronic abdominal pain from previous pancreatitis episodes, controlled with methadone and oxycodone. #5. Recurrent hematuria, currently resolved.  #6. Bilateral lower extremity varicosities with ulceration, for vascular surgery evaluation on 04/29/2014 #7. Patulous EG junction, status post dilation, not much improvement in symptoms.    PLAN:  #1. Followup with Dr. Scot Dock on 04/29/2014. #2. Followup with Dr. Gala Romney. #3. Continue oxycodone, Xarelto, methadone, pancreatic enzymes, and Nexium twice a day. #4. Followup in 4 weeks with CBC and d-dimer   All questions were answered. The patient knows to call the clinic with any problems, questions or concerns. We can certainly see the patient much sooner if necessary.   I spent 25 minutes counseling the patient face to face. The total time spent in the appointment was 30 minutes.    Alex Bradford, MD 04/25/2014 1:47 PM  DISCLAIMER:  This note was dictated with voice recognition software.  Similar sounding words can inadvertently be transcribed inaccurately and may not be corrected upon review.

## 2014-04-25 NOTE — Patient Instructions (Signed)
[facility] Discharge Instructions  RECOMMENDATIONS MADE BY THE CONSULTANT AND ANY TEST RESULTS WILL BE SENT TO YOUR REFERRING PHYSICIAN.  We will see you in 4 weeks for a doctor's visit and repeat labs.  Please call for any questions or concerns.  Thank you for choosing [facility] to provide your oncology and hematology care.  To afford each patient quality time with our providers, please arrive at least 15 minutes before your scheduled appointment time.  With your help, our goal is to use those 15 minutes to complete the necessary work-up to ensure our physicians have the information they need to help with your evaluation and healthcare recommendations.    Effective January 1st, 2014, we ask that you re-schedule your appointment with our physicians should you arrive 10 or more minutes late for your appointment.  We strive to give you quality time with our providers, and arriving late affects you and other patients whose appointments are after yours.    Again, thank you for choosing [facility].  Our hope is that these requests will decrease the amount of time that you wait before being seen by our physicians.       _____________________________________________________________  Should you have questions after your visit to [facility], please contact our office at [phone] between the hours of 8:30 a.m. and 4:30 p.m.  Voicemails left after 4:30 p.m. will not be returned until the following business day.  For prescription refill requests, have your pharmacy contact our office with your prescription refill request.    _______________________________________________________________  We hope that we have given you very good care.  You may receive a patient satisfaction survey in the mail, please complete it and return it as soon as possible.  We value your feedback!

## 2014-04-28 ENCOUNTER — Encounter: Payer: Self-pay | Admitting: Vascular Surgery

## 2014-04-28 ENCOUNTER — Other Ambulatory Visit: Payer: Self-pay | Admitting: *Deleted

## 2014-04-28 DIAGNOSIS — L97909 Non-pressure chronic ulcer of unspecified part of unspecified lower leg with unspecified severity: Principal | ICD-10-CM

## 2014-04-28 DIAGNOSIS — I83009 Varicose veins of unspecified lower extremity with ulcer of unspecified site: Secondary | ICD-10-CM

## 2014-04-29 ENCOUNTER — Ambulatory Visit (HOSPITAL_COMMUNITY)
Admission: RE | Admit: 2014-04-29 | Discharge: 2014-04-29 | Disposition: A | Discharge status: Home / Self Care | Payer: Medicare Other | Source: Ambulatory Visit | Attending: Vascular Surgery | Admitting: Vascular Surgery

## 2014-04-29 ENCOUNTER — Encounter: Payer: Self-pay | Admitting: Vascular Surgery

## 2014-04-29 ENCOUNTER — Ambulatory Visit (INDEPENDENT_AMBULATORY_CARE_PROVIDER_SITE_OTHER): Payer: Medicare Other | Admitting: Vascular Surgery

## 2014-04-29 VITALS — BP 145/86 | HR 74 | Resp 18 | Ht 72.0 in | Wt 210.0 lb

## 2014-04-29 DIAGNOSIS — I83215 Varicose veins of right lower extremity with both ulcer other part of foot and inflammation: Principal | ICD-10-CM

## 2014-04-29 DIAGNOSIS — I83229 Varicose veins of left lower extremity with both ulcer of unspecified site and inflammation: Secondary | ICD-10-CM

## 2014-04-29 DIAGNOSIS — I83893 Varicose veins of bilateral lower extremities with other complications: Secondary | ICD-10-CM | POA: Diagnosis not present

## 2014-04-29 DIAGNOSIS — I83221 Varicose veins of left lower extremity with both ulcer of thigh and inflammation: Secondary | ICD-10-CM

## 2014-04-29 DIAGNOSIS — I83212 Varicose veins of right lower extremity with both ulcer of calf and inflammation: Principal | ICD-10-CM

## 2014-04-29 DIAGNOSIS — I83211 Varicose veins of right lower extremity with both ulcer of thigh and inflammation: Secondary | ICD-10-CM

## 2014-04-29 DIAGNOSIS — L97929 Non-pressure chronic ulcer of unspecified part of left lower leg with unspecified severity: Secondary | ICD-10-CM | POA: Diagnosis not present

## 2014-04-29 DIAGNOSIS — I83224 Varicose veins of left lower extremity with both ulcer of heel and midfoot and inflammation: Secondary | ICD-10-CM

## 2014-04-29 DIAGNOSIS — I83222 Varicose veins of left lower extremity with both ulcer of calf and inflammation: Secondary | ICD-10-CM

## 2014-04-29 DIAGNOSIS — I83219 Varicose veins of right lower extremity with both ulcer of unspecified site and inflammation: Secondary | ICD-10-CM | POA: Diagnosis not present

## 2014-04-29 DIAGNOSIS — I83218 Varicose veins of right lower extremity with both ulcer of other part of lower extremity and inflammation: Principal | ICD-10-CM

## 2014-04-29 DIAGNOSIS — I83223 Varicose veins of left lower extremity with both ulcer of ankle and inflammation: Secondary | ICD-10-CM

## 2014-04-29 DIAGNOSIS — I83213 Varicose veins of right lower extremity with both ulcer of ankle and inflammation: Principal | ICD-10-CM

## 2014-04-29 DIAGNOSIS — I83009 Varicose veins of unspecified lower extremity with ulcer of unspecified site: Secondary | ICD-10-CM

## 2014-04-29 DIAGNOSIS — I83214 Varicose veins of right lower extremity with both ulcer of heel and midfoot and inflammation: Principal | ICD-10-CM

## 2014-04-29 DIAGNOSIS — L97919 Non-pressure chronic ulcer of unspecified part of right lower leg with unspecified severity: Secondary | ICD-10-CM

## 2014-04-29 DIAGNOSIS — L97909 Non-pressure chronic ulcer of unspecified part of unspecified lower leg with unspecified severity: Secondary | ICD-10-CM

## 2014-04-29 DIAGNOSIS — I83228 Varicose veins of left lower extremity with both ulcer of other part of lower extremity and inflammation: Secondary | ICD-10-CM

## 2014-04-29 DIAGNOSIS — I83225 Varicose veins of left lower extremity with both ulcer other part of foot and inflammation: Secondary | ICD-10-CM

## 2014-04-29 NOTE — Progress Notes (Signed)
Subjective:     Patient ID: Alex Hoffman, male   DOB: September 17, 1961, 53 y.o.   MRN: [id number]  HPI this 53 year old male was [facility] possible worsening venous insufficiency. Patient was seen by me in thoroughly evaluated in September of 2014. He was found to have no evidence of superficial venous reflux. He has a history of DVT in the left leg and a pulmonary embolus and has a factor V Leiden deficiency. He takes Air traffic controller daily. He complains of severe pain in both legs in the morning and throughout the day. He elevates his legs on pillows at night and does try to wear elastic compression stockings. He states that the feet feel as if they are about 2 "explode". He has no history of new DVT or thrombophlebitis. He has had some ulcerations in the left leg which have healed without visiting the wound center.  Past Medical History  Diagnosis Date  . Pulmonary embolism 12/2010  . Cystic thyroid nodule     left  . HTN (hypertension)   . Heterozygous factor V Leiden mutation 12/2010  . Pancreatitis 05/2011    EUS Dr Christella Hartigan 06/09/11-> dilated main pancreatic duct in HOP, 2-3 filling defects distal pancreatic duct, peripancreatic fluid resolved  . C. difficile colitis 01/2011?  Marland Kitchen Arthritis   . Clotting disorder   . GERD (gastroesophageal reflux disease)   . Chills   . Fever   . Weight loss   . Hearing loss   . Nasal congestion   . Leg swelling   . Palpitations   . Abdominal distension   . Abdominal pain   . Nausea & vomiting   . Rectal pain   . Difficulty urinating   . Arthritis pain   . Headache(784.0)   . Weakness   . Bruises easily   . Anemia   . Dizziness   . Numbness and tingling     both hands, and both legs and feet  . Joint pain   . Blood in urine     hx of, none current  . Frequent urination at night   . Pneumonia as child  . Abnormal EKG     hx left bundle branch block on ekg  . Sleep apnea     STOPBANG=5  . Heterozygous  factor V Leiden mutation   . Prostatitis, chronic   . Pancreatitis   . Pancreatitis 12/2011  . Arthritis     lower back. Dr. Charlynne Cousins in [place]  . GERD (gastroesophageal reflux disease)   . HTN (hypertension) 01/28/2014  . BPH (benign prostatic hyperplasia) 01/28/2014  . Factor V Leiden 07/19/2013    History  Substance Use Topics  . Smoking status: Former Smoker -- 1.00 packs/day for 35 years    Types: Cigarettes    Quit date: 12/21/2012  . Smokeless tobacco: Never Used     Comment: About 2 cigarettes per day.   . Alcohol Use: No     Comment: No alcohol since January 27, 2011    Family History  Problem Relation Age of Onset  . GI problems Brother   . Colon cancer Neg Hx   . Liver disease Neg Hx   . GI problems      stomach and lung cancer, aunts/uncles  . Heart attack Brother     before age of 58  . Stroke Brother   . Hypertension Mother   . Hypertension Father   . Cancer Maternal Aunt  colon/pancreas/lung/stomach  . Heart disease Maternal Uncle   . Cancer Maternal Grandfather     lung    Allergies  Allergen Reactions  . Reglan [Metoclopramide]     Muscle twitches.    Current outpatient prescriptions:amLODipine (NORVASC) 10 MG tablet, Take 1 tablet (10 mg total) by mouth every morning., Disp: 30 tablet, Rfl: 3;  diazepam (VALIUM) 5 MG tablet, Take 1 tablet (5 mg total) by mouth every 6 (six) hours as needed for anxiety., Disp: 40 tablet, Rfl: 2;  esomeprazole (NEXIUM) 40 MG capsule, Take 1 capsule (40 mg total) by mouth 2 (two) times daily. For indigestion, Disp: 60 capsule, Rfl: 11 ibuprofen (ADVIL,MOTRIN) 200 MG tablet, Take 400 mg by mouth 2 (two) times daily as needed for moderate pain., Disp: , Rfl: ;  Lidocaine-Hydrocortisone Ace 3-0.5 % KIT, Place 1 application rectally 2 (two) times daily., Disp: 30 each, Rfl: 0;  methadone (DOLOPHINE) 10 MG tablet, Take 5 tablets (50 mg total) by mouth every 8 (eight) hours., Disp: 450 tablet, Rfl: 0 oxycodone (ROXICODONE) 30  MG immediate release tablet, Take 1 tablet (30 mg total) by mouth every 4 (four) hours as needed for pain., Disp: 100 tablet, Rfl: 0;  Pancrelipase, Lip-Prot-Amyl, (ZENPEP) 40000 UNITS CPEP, 3 capsules with meals and 2 with snacks., Disp: 400 capsule, Rfl: 5;  phenazopyridine (PYRIDIUM) 100 MG tablet, Take 100 mg by mouth 2 (two) times daily as needed for pain. , Disp: , Rfl:  prochlorperazine (COMPAZINE) 10 MG tablet, Take 5-10 mg by mouth 3 (three) times daily. , Disp: , Rfl: ;  Rivaroxaban (XARELTO) 20 MG TABS tablet, Take 1 tablet (20 mg total) by mouth daily with supper., Disp: 30 tablet, Rfl: 12;  silodosin (RAPAFLO) 8 MG CAPS capsule, Take 1 capsule (8 mg total) by mouth daily with breakfast., Disp: 30 capsule, Rfl: 3 tiZANidine (ZANAFLEX) 4 MG capsule, Take 1 capsule up to 4 times a day for spasms., Disp: 120 capsule, Rfl: 3;  zolpidem (AMBIEN) 5 MG tablet, Take 1 tablet (5 mg total) by mouth at bedtime as needed for sleep., Disp: 30 tablet, Rfl: 3  BP 145/86  Pulse 74  Resp 18  Ht 6' (1.829 m)  Wt 210 lb (95.255 kg)  BMI 28.47 kg/m2  Body mass index is 28.47 kg/(m^2).             Review of Systems patient has multiple complaints for multiple systems including abdominal discomfort had previous cholecystectomy and has had pancreatitis. Also has chest pressure, dyspnea on exertion, orthopnea, leg swelling, weakness in arms and legs, dizziness, history of blood clotting, dysuria, rashes, ulcers, chills. All other systems negative for complete review of systems.     Objective:   Physical Exam BP 145/86  Pulse 74  Resp 18  Ht 6' (1.829 m)  Wt 210 lb (95.255 kg)  BMI 28.47 kg/m2  Gen.-alert and oriented x3 in no apparent distress HEENT normal for age Lungs no rhonchi or wheezing Cardiovascular regular rhythm no murmurs carotid pulses 3+ palpable no bruits audible Abdomen soft nontender no palpable masses Musculoskeletal free of  major deformities Skin clear -no  rashes Neurologic normal Lower extremities 3+ femoral and dorsalis pedis pulses palpable bilaterally with 1+ edema bilaterally below the knee. 2 small ulcerations left leg the medial aspect measuring about 3 mm in diameter which have essentially healed. Telangiectasias evidence and lower third of both legs around Malleoli  Medially.  Today I ordered bilateral venous duplex exam which are viewed and interpreted. There is  essentially no change from a study in September of 2014. There is no new DVT. There is no reflux and superficial systems bilaterally. There is deep venous reflux bilaterally.       Assessment:     Chronic bilateral venous insufficiency with reflux and deep venous systems but no DVT and no superficial venous reflux of significance causing chronic pain and swelling in    Plan:     #1 patient needs to elevate the foot of the bed rather than you spell was with about 4 inch blocks under the foot of the bed #2 patient needs to apply a long-leg last a compression stockings before getting out of bed in the morning to get maximal improvement #3 no vascular procedures indicated or it would be helpful Discuss this with patient and his wife and they seem to understand and accept this

## 2014-05-08 ENCOUNTER — Encounter: Payer: Self-pay | Admitting: Gastroenterology

## 2014-05-08 ENCOUNTER — Ambulatory Visit (INDEPENDENT_AMBULATORY_CARE_PROVIDER_SITE_OTHER): Payer: Medicare Other | Admitting: Gastroenterology

## 2014-05-08 ENCOUNTER — Other Ambulatory Visit: Payer: Self-pay | Admitting: Gastroenterology

## 2014-05-08 VITALS — BP 141/89 | HR 65 | Temp 96.9°F | Ht 72.0 in | Wt 213.6 lb

## 2014-05-08 DIAGNOSIS — R109 Unspecified abdominal pain: Secondary | ICD-10-CM

## 2014-05-08 DIAGNOSIS — G8929 Other chronic pain: Secondary | ICD-10-CM

## 2014-05-08 DIAGNOSIS — R1319 Other dysphagia: Secondary | ICD-10-CM

## 2014-05-08 DIAGNOSIS — R1310 Dysphagia, unspecified: Secondary | ICD-10-CM | POA: Diagnosis not present

## 2014-05-08 NOTE — Assessment & Plan Note (Signed)
In setting of constipation, chronic pancreatitis, multiple co-morbidities. Extensive evaluation already completed through our facility. [facility]. Patient agreeable and welcomes this idea. ?celiac block for assistance with pain control?

## 2014-05-08 NOTE — Progress Notes (Signed)
Primary Care Physician:  Dr. Barnet Glasgow (per patient) Primary GI: Dr. Gala Romney  Chief Complaint  Patient presents with  . Follow-up    doing about the same as before    HPI:   Alex Hoffman presents today in follow-up with history of chronic pancreatitis, GERD, fatty liver, constipation, chronic abdominal pain. Recent EGD/ED completed due to dysphagia. No improvement after dilation. Reports periumbilical discomfort, constant. Reports epigastric/RUQ pain, constant. Chronic abdominal pain. Hard to get up from sitting position. Stays bloated all the time. Feels like he is always about to "bust". Doesn't eat a lot. Constant nausea. Compazine for nausea. Phenergan works but "knocks me out". Unable to tolerate Linzess or Amitiza. Now takes Dulcolax tablets. Taking pancreatic enzymes. Extensive evaluation here for abdominal pain to include CT imaging, blood work. Multiple other comorbitdiies.     Past Medical History  Diagnosis Date  . Pulmonary embolism 12/2010  . Cystic thyroid nodule     left  . HTN (hypertension)   . Heterozygous factor V Leiden mutation 12/2010  . Pancreatitis 05/2011    EUS Dr Ardis Hughs 06/09/11-> dilated main pancreatic duct in HOP, 2-3 filling defects distal pancreatic duct, peripancreatic fluid resolved  . C. difficile colitis 01/2011?  Marland Kitchen Arthritis   . Clotting disorder   . GERD (gastroesophageal reflux disease)   . Chills   . Fever   . Weight loss   . Hearing loss   . Nasal congestion   . Leg swelling   . Palpitations   . Abdominal distension   . Abdominal pain   . Nausea & vomiting   . Rectal pain   . Difficulty urinating   . Arthritis pain   . Headache(784.0)   . Weakness   . Bruises easily   . Anemia   . Dizziness   . Numbness and tingling     both hands, and both legs and feet  . Joint pain   . Blood in urine     hx of, none current  . Frequent urination at night   . Pneumonia as child  . Abnormal EKG     hx left bundle branch block on ekg  . Sleep  apnea     STOPBANG=5  . Heterozygous factor V Leiden mutation   . Prostatitis, chronic   . Pancreatitis   . Pancreatitis 12/2011  . Arthritis     lower back. Dr. Effie Berkshire in [place]  . GERD (gastroesophageal reflux disease)   . HTN (hypertension) 01/28/2014  . BPH (benign prostatic hyperplasia) 01/28/2014  . Factor V Leiden 07/19/2013    Past Surgical History  Procedure Laterality Date  . Neck surgery  1999    C5/C6 fusion, limited neck mobility especially turning to left  . Knee surgery  1980's    arthroscopy, left knee  . Femur fracture surgery  age 56    right leg  . Nerve surgery  1980's    right thumb  . Colonoscopy  01/2011    Dr. Annitta [facility] at [facility]-->normal  . Esophagogastroduodenoscopy  01/2011    Dr. Annitta Jersey at [facility]-->bile reflux in stomach-->patient c/o inadequate conscious sedation (Fentanyl 174m/Versed 840m  . E[facility]  07/2011    [facility] pancreatic enterotomy, pancreatic duct stones removed, bile duct not manipulated, no stents  . Cholecystectomy  01/25/2012    Procedure: LAPAROSCOPIC CHOLECYSTECTOMY WITH INTRAOPERATIVE CHOLANGIOGRAM;  Surgeon: PaMerrie RoofMD;  Location: [facility];  Service: General;  Laterality: N/A;  . Cystoscopy with hydrodistension  and biopsy  01/25/2012    Procedure: CYSTOSCOPY/BIOPSY/HYDRODISTENSION;  Surgeon: Malka So, MD;  Location: [facility];  Service: Urology;  Laterality: N/A;  Cystoscopy and Hydrodistension of the Bladder  . Colonoscopy with propofol  11/22/2012    XBD:[id number] anal rectum/fissure not identified although he certainly could have a chronic anal fissure   . Esophagogastroduodenoscopy (egd) with propofol N/A 02/13/2014    QAS:[id number] EGD as described above s/p bx (mild changes of reflux on esophageal bx  . Esophageal biopsy N/A 02/13/2014    Procedure: BIOPSY;  Surgeon: Daneil Dolin, MD;  Location: [facility];  Service: Endoscopy;  Laterality: N/A;  . Esophagogastroduodenoscopy (egd) with propofol N/A 04/10/2014     Dr. Gala Romney: patulous EG junction, s/p 65 F Maloney dilation empirically  . Esophageal dilation N/A 04/10/2014    Procedure: ESOPHAGEAL DILATION;  Surgeon: Daneil Dolin, MD;  Location: [facility];  Service: Endoscopy;  Laterality: N/A;  M[place]oney 54,     Current Outpatient Prescriptions  Medication Sig Dispense Refill  . amLODipine (NORVASC) 10 MG tablet Take 1 tablet (10 mg total) by mouth every morning.  30 tablet  3  . diazepam (VALIUM) 5 MG tablet Take 1 tablet (5 mg total) by mouth every 6 (six) hours as needed for anxiety.  40 tablet  2  . esomeprazole (NEXIUM) 40 MG capsule Take 1 capsule (40 mg total) by mouth 2 (two) times daily. For indigestion  60 capsule  11  . ibuprofen (ADVIL,MOTRIN) 200 MG tablet Take 400 mg by mouth 2 (two) times daily as needed for moderate pain.      . Lidocaine-Hydrocortisone Ace 3-0.5 % KIT Place 1 application rectally 2 (two) times daily.  30 each  0  . methadone (DOLOPHINE) 10 MG tablet Take 5 tablets (50 mg total) by mouth every 8 (eight) hours.  450 tablet  0  . oxycodone (ROXICODONE) 30 MG immediate release tablet Take 1 tablet (30 mg total) by mouth every 4 (four) hours as needed for pain.  100 tablet  0  . Pancrelipase, Lip-Prot-Amyl, (ZENPEP) 40000 UNITS CPEP 3 capsules with meals and 2 with snacks.  400 capsule  5  . phenazopyridine (PYRIDIUM) 100 MG tablet Take 100 mg by mouth 2 (two) times daily as needed for pain.       Marland Kitchen prochlorperazine (COMPAZINE) 10 MG tablet Take 5-10 mg by mouth 3 (three) times daily.       . Rivaroxaban (XARELTO) 20 MG TABS tablet Take 1 tablet (20 mg total) by mouth daily with supper.  30 tablet  12  . silodosin (RAPAFLO) 8 MG CAPS capsule Take 1 capsule (8 mg total) by mouth daily with breakfast.  30 capsule  3  . tiZANidine (ZANAFLEX) 4 MG capsule Take 1 capsule up to 4 times a day for spasms.  120 capsule  3  . zolpidem (AMBIEN) 5 MG tablet Take 1 tablet (5 mg total) by mouth at bedtime as needed for sleep.  30 tablet  3    No current facility-administered medications for this visit.    Allergies as of 05/08/2014 - Review Complete 05/08/2014  Allergen Reaction Noted  . Reglan [metoclopramide]  08/09/2013    Family History  Problem Relation Age of Onset  . GI problems Brother   . Colon cancer Neg Hx   . Liver disease Neg Hx   . GI problems      stomach and lung cancer, aunts/uncles  . Heart attack Brother     before  age of 46  . Stroke Brother   . Hypertension Mother   . Hypertension Father   . Cancer Maternal Aunt     colon/pancreas/lung/stomach  . Heart disease Maternal Uncle   . Cancer Maternal Grandfather     lung    History   Social History  . Marital Status: Married    Spouse Name: N/A    Number of Children: 2  . Years of Education: N/A   Occupational History  .      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 Smoking status: Former Smoker -- 1.00 packs/day for 35 years    Types: Cigarettes    Quit date: 12/21/2012  . Smokeless tobacco: Never Used     Comment: About 2 cigarettes per day.   . Alcohol Use: No     Comment: No alcohol since January 27, 2011  . Drug Use: No  . Sexual Activity: None   Other Topics Concern  . None   Social History Narrative  . None    Review of Systems: As mentioned in HPI.   Physical Exam: BP 141/89  Pulse 65  Temp(Src) 96.9 F (36.1 C) (Oral)  Ht 6' (1.829 m)  Wt 213 lb 9.6 oz (96.888 kg)  BMI 28.96 kg/m2 General:   Alert and oriented. No distress noted. Pleasant and cooperative.  Head:  Normocephalic and atraumatic. Eyes:  Conjuctiva clear without scleral icterus. Heart:  S1, S2 present without murmurs, rubs, or gallops. Regular rate and rhythm. Abdomen:  +BS, soft, TTP with only extremely light touch, superficial touch. Out of proportion to exam.  Msk:  Symmetrical without gross deformities. Normal posture. Extremities:  1+ edema, chronic venous stasis  changes Neurologic:  Alert and  oriented x4;  grossly normal neurologically. Skin:  Intact without significant lesions or rashes. Psych:  Alert and cooperative. Normal mood and affect.  Lab Results  Component Value Date   ALT 25 01/13/2014   AST 22 01/13/2014   ALKPHOS 105 01/13/2014   BILITOT 1.0 01/13/2014   Lab Results  Component Value Date   WBC 7.6 04/25/2014   HGB 13.4 04/25/2014   HCT 38.8* 04/25/2014   MCV 82.2 04/25/2014   PLT 290 04/25/2014

## 2014-05-08 NOTE — Assessment & Plan Note (Signed)
No improvement with recent dilation. Query motility disorder. BPE for further evaluation.

## 2014-05-08 NOTE — Patient Instructions (Signed)
We have scheduled a swallowing test due to your difficulty swallowing.  I have referred [facility] to [facility] for further evaluation of abdominal pain.  We will see you in 6 months!

## 2014-05-09 NOTE — Progress Notes (Signed)
Referral has been made to [facility] for Second Opinion

## 2014-05-13 ENCOUNTER — Other Ambulatory Visit (HOSPITAL_COMMUNITY): Payer: Medicare Other

## 2014-05-13 ENCOUNTER — Ambulatory Visit (HOSPITAL_COMMUNITY)
Admission: RE | Admit: 2014-05-13 | Discharge: 2014-05-13 | Disposition: A | Discharge status: Home / Self Care | Payer: Medicare Other | Source: Ambulatory Visit | Attending: Gastroenterology | Admitting: Gastroenterology

## 2014-05-13 DIAGNOSIS — K222 Esophageal obstruction: Secondary | ICD-10-CM | POA: Diagnosis not present

## 2014-05-13 DIAGNOSIS — R1319 Other dysphagia: Secondary | ICD-10-CM | POA: Diagnosis not present

## 2014-05-13 DIAGNOSIS — R112 Nausea with vomiting, unspecified: Secondary | ICD-10-CM | POA: Diagnosis not present

## 2014-05-21 NOTE — Progress Notes (Signed)
Quick Note:  Focal narrowing at GE junction noted, obstruction the tablet.  Patient just had an EGD/ED a few weeks ago. How are his symptoms? I need to review this further, as he just had a procedure. ______

## 2014-05-23 ENCOUNTER — Encounter (HOSPITAL_COMMUNITY): Payer: Medicare Other | Attending: Hematology and Oncology

## 2014-05-23 ENCOUNTER — Encounter (HOSPITAL_BASED_OUTPATIENT_CLINIC_OR_DEPARTMENT_OTHER): Payer: Medicare Other

## 2014-05-23 ENCOUNTER — Encounter (HOSPITAL_COMMUNITY): Payer: Self-pay

## 2014-05-23 VITALS — BP 154/75 | HR 83 | Temp 98.1°F | Resp 20 | Wt 214.7 lb

## 2014-05-23 DIAGNOSIS — D6859 Other primary thrombophilia: Secondary | ICD-10-CM

## 2014-05-23 DIAGNOSIS — G8929 Other chronic pain: Secondary | ICD-10-CM | POA: Diagnosis not present

## 2014-05-23 DIAGNOSIS — Z86718 Personal history of other venous thrombosis and embolism: Secondary | ICD-10-CM

## 2014-05-23 DIAGNOSIS — Z7901 Long term (current) use of anticoagulants: Secondary | ICD-10-CM

## 2014-05-23 DIAGNOSIS — J449 Chronic obstructive pulmonary disease, unspecified: Secondary | ICD-10-CM | POA: Diagnosis not present

## 2014-05-23 DIAGNOSIS — R109 Unspecified abdominal pain: Secondary | ICD-10-CM | POA: Diagnosis not present

## 2014-05-23 DIAGNOSIS — Z86711 Personal history of pulmonary embolism: Secondary | ICD-10-CM | POA: Diagnosis not present

## 2014-05-23 DIAGNOSIS — R2243 Localized swelling, mass and lump, lower limb, bilateral: Secondary | ICD-10-CM

## 2014-05-23 DIAGNOSIS — M7989 Other specified soft tissue disorders: Secondary | ICD-10-CM | POA: Diagnosis not present

## 2014-05-23 LAB — CBC WITH DIFFERENTIAL/PLATELET
BASOS PCT: 0 % (ref 0–1)
Basophils Absolute: 0 10*3/uL (ref 0.0–0.1)
Eosinophils Absolute: 0.2 10*3/uL (ref 0.0–0.7)
Eosinophils Relative: 2 % (ref 0–5)
HCT: 38.6 % — ABNORMAL LOW (ref 39.0–52.0)
HEMOGLOBIN: 13.2 g/dL (ref 13.0–17.0)
Lymphocytes Relative: 34 % (ref 12–46)
Lymphs Abs: 3.2 10*3/uL (ref 0.7–4.0)
MCH: 28.4 pg (ref 26.0–34.0)
MCHC: 34.2 g/dL (ref 30.0–36.0)
MCV: 83.2 fL (ref 78.0–100.0)
MONOS PCT: 6 % (ref 3–12)
Monocytes Absolute: 0.6 10*3/uL (ref 0.1–1.0)
NEUTROS ABS: 5.4 10*3/uL (ref 1.7–7.7)
Neutrophils Relative %: 58 % (ref 43–77)
Platelets: 294 10*3/uL (ref 150–400)
RBC: 4.64 MIL/uL (ref 4.22–5.81)
RDW: 12.7 % (ref 11.5–15.5)
WBC: 9.4 10*3/uL (ref 4.0–10.5)

## 2014-05-23 LAB — D-DIMER, QUANTITATIVE (NOT AT ARMC): D-Dimer, Quant: <0.27 ug/mL-FEU (ref 0.00–0.48)

## 2014-05-23 MED ORDER — OXYCODONE HCL 30 MG PO TABS: 30.0000 mg | ORAL_TABLET | Freq: Three times a day (TID) | ORAL | Status: DC | PRN

## 2014-05-23 MED ORDER — METHADONE HCL 10 MG PO TABS: 50.0000 mg | ORAL_TABLET | Freq: Three times a day (TID) | ORAL | Status: DC

## 2014-05-23 NOTE — Patient Instructions (Addendum)
[facility] Discharge Instructions  RECOMMENDATIONS MADE BY THE CONSULTANT AND ANY TEST RESULTS WILL BE SENT TO YOUR REFERRING PHYSICIAN.  EXAM FINDINGS BY THE PHYSICIAN TODAY AND SIGNS OR SYMPTOMS TO REPORT TO CLINIC OR PRIMARY PHYSICIAN: Exam and findings as discussed by Dr. Idell PicklesHeller.  No changes in therapy.  Keep appointment at [facility]. Refills for Oxycodone and Methadone     INSTRUCTIONS/FOLLOW-UP: Follow-up in 1 month.  Thank you for choosing [facility] to provide your oncology and hematology care.  To afford each patient quality time with our providers, please arrive at least 15 minutes before your scheduled appointment time.  With your help, our goal is to use those 15 minutes to complete the necessary work-up to ensure our physicians have the information they need to help with your evaluation and healthcare recommendations.    Effective January 1st, 2014, we ask that you re-schedule your appointment with our physicians should you arrive 10 or more minutes late for your appointment.  We strive to give you quality time with our providers, and arriving late affects you and other patients whose appointments are after yours.    Again, thank you for choosing [facility].  Our hope is that these requests will decrease the amount of time that you wait before being seen by our physicians.       _____________________________________________________________  Should you have questions after your visit to [facility], please contact our office at [phone] between the hours of 8:30 a.m. and 4:30 p.m.  Voicemails left after 4:30 p.m. will not be returned until the following business day.  For prescription refill requests, have your pharmacy contact our office with your prescription refill request.    _______________________________________________________________  We hope that we have given you very good care.  You may receive a  patient satisfaction survey in the mail, please complete it and return it as soon as possible.  We value your feedback!  _______________________________________________________________  Have you asked about our STAR program?  STAR stands for Survivorship Training and Rehabilitation, and this is a nationally recognized cancer care program that focuses on survivorship and rehabilitation.  Cancer and cancer treatments may cause problems, such as, pain, making you feel tired and keeping you from doing the things that you need or want to do. Cancer rehabilitation can help. Our goal is to reduce these troubling effects and help you have the best quality of life possible.  You may receive a survey from a nurse that asks questions about your current state of health.  Based on the survey results, all eligible patients will be referred to the [facility] for an evaluation so we can better serve you!  A frequently asked questions sheet is available upon request.

## 2014-05-23 NOTE — Progress Notes (Signed)
Labs drawn for ddim,cbcd

## 2014-05-24 NOTE — Progress Notes (Signed)
[facility] OFFICE PROGRESS NOTE  PCP Maurilio Lovely, MD [address] [place] [address]  DIAGNOSIS: Localized swelling of both lower legs - Plan: oxycodone (ROXICODONE) 30 MG immediate release tablet, methadone (DOLOPHINE) 10 MG tablet                        Hypercoagulable state - Factor V Leiden (heterzygous) Plan: CBC with Differential, D-dimer, quantitative   CURRENT THERAPY: Methadone, oxycodone, Xarelto, Nexium, pancreatic enzymes.   INTERVAL HEMATOLOGY/ONCOLOGY HX: Alex Hoffman 53 y.o. male returns for followup of thrombophilia. He also has chronic pancreatic abdominal pain, status post aborted EGD on 02/13/2014 because of retained food. Second attempt was performed on 04/10/2014 revealed a patulous EG junction, status post passage of a Maloney dilator with directions to take twice daily PPI and to resume Xarelto and pancreatic enzyme therapy.  He is not knows very much difference in his swallowing. He does get nauseated on occasion but has had no vomiting, hematemesis, melena, hematochezia, hematuria, or epistaxis. Pain is controlled with current analgesic regimen.   The patient had an appointment with Dr. Hart Rochester, vascular surgeon. Patient was seen by him and thoroughly evaluated in September of 2014. He was found to have no evidence of superficial venous reflux. He has a history of DVT in the left leg and a pulmonary embolus and has a factor V Leiden deficiency. He takes Air traffic controller daily. He complains of severe pain in both legs in the morning and throughout the day. He elevates his legs on pillows at night and does try to wear elastic compression stockings. He states that the feet feel as if they are about 2 "explode". He has no history of new DVT or thrombophlebitis. He has had some ulcerations in the left leg which have healed without visiting the wound center. A bilateral venous duplex exam which were viewed and interpreted. There is  essentially no change from a study in September of 2014. There is no new DVT. There is no reflux and superficial systems bilaterally. There is deep venous reflux bilaterally. There was no change from a study in September of 2014.  No vascular procedures indicated or it would be helpful in this patient.    MEDICAL HISTORY:  Past Medical History  Diagnosis Date  . Pulmonary embolism 12/2010  . Cystic thyroid nodule     left  . HTN (hypertension)   . Heterozygous factor V Leiden mutation 12/2010  . Pancreatitis 05/2011    EUS Dr Christella Hartigan 06/09/11-> dilated main pancreatic duct in HOP, 2-3 filling defects distal pancreatic duct, peripancreatic fluid resolved  . C. difficile colitis 01/2011?  Marland Kitchen Arthritis   . Clotting disorder   . GERD (gastroesophageal reflux disease)   . Chills   . Fever   . Weight loss   . Hearing loss   . Nasal congestion   . Leg swelling   . Palpitations   . Abdominal distension   . Abdominal pain   . Nausea & vomiting   . Rectal pain   . Difficulty urinating   . Arthritis pain   . Headache(784.0)   . Weakness   . Bruises easily   . Anemia   . Dizziness   . Numbness and tingling     both hands, and both legs and feet  . Joint pain   . Blood in urine     hx of, none current  . Frequent urination at  night   . Pneumonia as child  . Abnormal EKG     hx left bundle branch block on ekg  . Sleep apnea     STOPBANG=5  . Heterozygous factor V Leiden mutation   . Prostatitis, chronic   . Pancreatitis   . Pancreatitis 12/2011  . Arthritis     lower back. Dr. Charlynne CousinsAlabanza in [place]  . GERD (gastroesophageal reflux disease)   . HTN (hypertension) 01/28/2014  . BPH (benign prostatic hyperplasia) 01/28/2014  . 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    SURGICAL HISTORY: Recorded in the chart from last month's visit  ALLERGIES:  is allergic to reglan.  MEDICATIONS: has a current medication list which includes the following prescription(s): amlodipine, diazepam, esomeprazole, ibuprofen, lidocaine-hydrocortisone ace, methadone, oxycodone, pancrelipase (lip-prot-amyl), phenazopyridine, prochlorperazine, rivaroxaban, silodosin, tizanidine, and zolpidem.  FAMILY HISTORY: family history includes Cancer in his maternal aunt and maternal grandfather; GI problems in his brother and another family member; Heart attack in his brother; Heart disease in his maternal uncle; Hypertension in his father and mother; Stroke in his brother. There is no history of Colon cancer or Liver disease.  REVIEW OF SYSTEMS:    SINCE YOUR LAST VISIT Been diagnosed or treated for a new medical /surgical  problem or condition: Yes (esophageal dilation) Any Recent Xrays or studies performed: Yes Any new prescription or OTC medications: No ECOG Perf Status: Restricted in physically strenuous activity but ambulatory and able to carry out work of a light or sedentary nature, e.g., light house work, office work Problems sleeping: No Medications taken to help sleep: Yes How is your appetie: 75% normal Any Supplements: No Any trouble chewing or swallowing: Yes - Solids;Yes- Liquids (due to esophagus and reflux) Any Nausea or Vomiting: Yes (intermittent nausea) Any Bowel problems: No # Bowel Movements per week: 7 Any Urinary Issues: Yes (unchanged) Any Cardiac Problems: No Any Respiratory Issues:  (exertional dyspnea) Any Neurological Issues: Yes (chronic leg pain) Do you live alone: No Feelings hopelessness: No You or your family have any concerns or Health changes: No Pain Assessment Pain Score: 7    Pain Location:  (bilateral feet, bilateral legs and abdomen) Pain Orientation: Other (Comment) Pain Descriptors / Indicators: Aching;Throbbing Pain Frequency: Constant Pain Onset: On-going Pain Relieving Factors:  (pain meds/stockings and elevation of legs) Pain Aggravating Factors:  (certain movements) Pain Intervention(s): Medication (See eMAR);Rest Significant Pain in Recent Past: Yes, chronic Effect of Pain on Daily Activities:  (unable to do any work activities.)  He has pressure like discomfort in lower abdomen when he voids. No dysuria or hematuria Other than that discussed above is noncontributory.    PHYSICAL EXAMINATION:   weight is 214 lb 11.2 oz (97.387 kg). His oral temperature is 98.1 F (36.7 C). His blood pressure is 154/75 and his pulse is 83. His respiration is 20 and oxygen saturation is 100%.    GENERAL: alert, no distress.  comfortable SKIN: skin color, texture, turgor are normal. Scattered telangiectasiae over the lower extremities.  EYES: PERRLA; Conjunctiva are pink and non-injected, sclera clear. No pinpoint pupils OROPHARYNX: no exudate, no erythema on lips, buccal mucosa, or tongue. LUNGS: clear to auscultation and percussion with normal breathing  effort,  HEART: regular rate & rhythm and no murmurs.  ABDOMEN: diffusely tender without rebound. bowel sounds are decreased. MUSCULOSKELETAL/EXTREMITIES: He wears compression stockings. Lower extremity edema 1+. Pulses are intact. NEURO: alert & oriented x 3 with fluent speech  LABORATORY DATA: Lab on 05/23/2014  Component Date Value Ref Range Status  . WBC 05/23/2014 9.4  4.0 - 10.5 K/uL Final  . RBC 05/23/2014 4.64  4.22 - 5.81 MIL/uL Final  . Hemoglobin 05/23/2014 13.2  13.0 - 17.0 g/dL Final  . HCT 81/19/1478 38.6* 39.0 - 52.0 % Final  . MCV 05/23/2014 83.2  78.0 - 100.0 fL Final  . MCH 05/23/2014 28.4  26.0 - 34.0 pg Final  . MCHC 05/23/2014 34.2  30.0 - 36.0 g/dL Final  . RDW 29/56/2130 12.7  11.5  - 15.5 % Final  . Platelets 05/23/2014 294  150 - 400 K/uL Final  . Neutrophils Relative % 05/23/2014 58  43 - 77 % Final  . Neutro Abs 05/23/2014 5.4  1.7 - 7.7 K/uL Final  . Lymphocytes Relative 05/23/2014 34  12 - 46 % Final  . Lymphs Abs 05/23/2014 3.2  0.7 - 4.0 K/uL Final  . Monocytes Relative 05/23/2014 6  3 - 12 % Final  . Monocytes Absolute 05/23/2014 0.6  0.1 - 1.0 K/uL Final  . Eosinophils Relative 05/23/2014 2  0 - 5 % Final  . Eosinophils Absolute 05/23/2014 0.2  0.0 - 0.7 K/uL Final  . Basophils Relative 05/23/2014 0  0 - 1 % Final  . Basophils Absolute 05/23/2014 0.0  0.0 - 0.1 K/uL Final  . D-Dimer, Quant 05/23/2014 <0.27  0.00 - 0.48 ug/mL-FEU Final   Comment:                                 AT THE INHOUSE ESTABLISHED CUTOFF                          VALUE OF 0.48 ug/mL FEU,                          THIS ASSAY HAS BEEN DOCUMENTED                          IN THE LITERATURE TO HAVE                          A SENSITIVITY AND NEGATIVE                          PREDICTIVE VALUE OF AT LEAST                          98 TO 99%.  THE TEST RESULT                          SHOULD BE CORRELATED WITH                          AN ASSESSMENT OF THE CLINICAL                          PROBABILITY OF DVT / VTE.  Lab on 04/25/2014  Component Date Value Ref Range Status  . WBC 04/25/2014 7.6  4.0 - 10.5 K/uL Final  . RBC 04/25/2014 4.72  4.22 - 5.81 MIL/uL Final  . Hemoglobin 04/25/2014 13.4  13.0 - 17.0 g/dL Final  . HCT 96/01/5408 38.8* 39.0 - 52.0 % Final  . MCV 04/25/2014 82.2  78.0 - 100.0 fL Final  . MCH 04/25/2014 28.4  26.0 - 34.0 pg Final  . MCHC 04/25/2014 34.5  30.0 - 36.0 g/dL Final  . RDW 81/19/1478 12.5  11.5 - 15.5 % Final  . Platelets 04/25/2014 290  150 - 400 K/uL Final  . Neutrophils Relative % 04/25/2014 48  43 - 77 % Final  . Neutro Abs 04/25/2014 3.6  1.7 - 7.7 K/uL Final  . Lymphocytes Relative 04/25/2014 44  12 - 46 % Final  . Lymphs Abs 04/25/2014 3.3  0.7 - 4.0  K/uL Final  . Monocytes Relative 04/25/2014 6  3 - 12 % Final  . Monocytes Absolute 04/25/2014 0.5  0.1 - 1.0 K/uL Final  . Eosinophils Relative 04/25/2014 2  0 - 5 % Final  . Eosinophils Absolute 04/25/2014 0.2  0.0 - 0.7 K/uL Final  . Basophils Relative 04/25/2014 0  0 - 1 % Final  . Basophils Absolute 04/25/2014 0.0  0.0 - 0.1 K/uL Final  . D-Dimer, Quant 04/25/2014 0.33  0.00 - 0.48 ug/mL-FEU Final   Comment:                                 AT THE INHOUSE ESTABLISHED CUTOFF                          VALUE OF 0.48 ug/mL FEU,                          THIS ASSAY HAS BEEN DOCUMENTED                          IN THE LITERATURE TO HAVE                          A SENSITIVITY AND NEGATIVE                          PREDICTIVE VALUE OF AT LEAST                          98 TO 99%.  THE TEST RESULT                          SHOULD BE CORRELATED WITH                          AN ASSESSMENT OF THE CLINICAL                          PROBABILITY OF DVT / VTE.     RADIOGRAPHIC STUDIES: Dg Esophagus  05/13/2014   CLINICAL DATA:  Dysphagia for solids, liquids, and pills for approximately 3 years, worsening, episodes of nausea and vomiting, states had esophagus stretched 3 weeks ago with persistent problems.  EXAM: ESOPHOGRAM / BARIUM SWALLOW / BARIUM TABLET STUDY  TECHNIQUE: Combined double contrast and single contrast examination performed using effervescent crystals, thick barium liquid, and thin barium liquid. The patient was observed with fluoroscopy swallowing a 13mm barium sulphate tablet.  FLUOROSCOPY TIME:  2 min 48 seconds  COMPARISON:  None  FINDINGS: Minimal age-related impairment of esophageal motility with incomplete clearance of barium by primary peristaltic waves.  Focal narrowing of esophagus at gastroesophageal junction, obstructing a 12.5 mm tablet.  Tablet would not pass despite multiple swallows of water and thin barium and distension dissolved in place.  No persistent intraluminal filling  defects.  Smooth appearance of esophageal walls without irregularity or ulceration.  Rapid sequence imaging of the cervical esophagus and hypopharynx shows no laryngeal penetration or aspiration.  Patient is status post anterior cervical spine fusion.  Small vallecular residuals were noted.  No hiatal hernia or gastroesophageal reflux seen during exam.  IMPRESSION: Focal narrowing of gastroesophageal junction, obstructing a 12.5 mm diameter barium tablet.  Small vallecular residuals.  Otherwise negative exam.   Electronically Signed   By: Ulyses Southward M.D.   On: 05/13/2014 11:50    ASSESSMENT/PLAN:    #1. Bilateral postphlebitic lower extremity pain. History of DVT left leg and 1 episode of a PE.  #2. Factor V Leiden mutation on lifelong Xarelto.  #3. Chronic obstructive pulmonary disease, no longer smoking.  #4. Chronic abdominal pain from previous pancreatitis episodes, requiring methadone and oxycodone. He states he will be seeing Dr. Logan Bores,                  Gastroenterologist at [facility] for re-evaluation #5. Urology followup at [facility] per the patient. #6. 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 Bloodwork reviewed.  His pain medications are renewed today.       All questions were answered. The patient knows to call the clinic with any problems, questions or concerns. We can certainly see the patient much sooner if necessary.    Imelda Pillow, MD 05/24/2014 4:06 PM

## 2014-06-03 ENCOUNTER — Other Ambulatory Visit (HOSPITAL_COMMUNITY): Payer: Self-pay | Admitting: Hematology and Oncology

## 2014-06-03 MED ORDER — TIZANIDINE HCL 4 MG PO CAPS: ORAL_CAPSULE | ORAL | Status: DC

## 2014-06-11 ENCOUNTER — Encounter (HOSPITAL_COMMUNITY): Payer: Medicare Other

## 2014-06-11 ENCOUNTER — Encounter: Payer: Medicare Other | Admitting: Vascular Surgery

## 2014-06-11 NOTE — Progress Notes (Signed)
Appointment scheduled at [facility] for 2nd Opinion is August 22, 2014 at 8:30

## 2014-06-20 ENCOUNTER — Encounter (HOSPITAL_COMMUNITY): Payer: Self-pay

## 2014-06-20 ENCOUNTER — Encounter (HOSPITAL_COMMUNITY): Payer: Medicare Other | Attending: Hematology and Oncology

## 2014-06-20 VITALS — BP 138/67 | HR 77 | Temp 97.6°F | Resp 20 | Wt 212.8 lb

## 2014-06-20 DIAGNOSIS — K861 Other chronic pancreatitis: Secondary | ICD-10-CM | POA: Diagnosis not present

## 2014-06-20 DIAGNOSIS — R2243 Localized swelling, mass and lump, lower limb, bilateral: Secondary | ICD-10-CM

## 2014-06-20 DIAGNOSIS — D6859 Other primary thrombophilia: Secondary | ICD-10-CM | POA: Diagnosis not present

## 2014-06-20 DIAGNOSIS — M7989 Other specified soft tissue disorders: Secondary | ICD-10-CM | POA: Diagnosis not present

## 2014-06-20 LAB — CBC WITH DIFFERENTIAL/PLATELET
BASOS ABS: 0 10*3/uL (ref 0.0–0.1)
Basophils Relative: 1 % (ref 0–1)
Eosinophils Absolute: 0.3 10*3/uL (ref 0.0–0.7)
Eosinophils Relative: 3 % (ref 0–5)
HCT: 39.3 % (ref 39.0–52.0)
Hemoglobin: 13.1 g/dL (ref 13.0–17.0)
LYMPHS ABS: 3 10*3/uL (ref 0.7–4.0)
LYMPHS PCT: 36 % (ref 12–46)
MCH: 27.8 pg (ref 26.0–34.0)
MCHC: 33.3 g/dL (ref 30.0–36.0)
MCV: 83.3 fL (ref 78.0–100.0)
Monocytes Absolute: 0.5 10*3/uL (ref 0.1–1.0)
Monocytes Relative: 6 % (ref 3–12)
NEUTROS PCT: 54 % (ref 43–77)
Neutro Abs: 4.5 10*3/uL (ref 1.7–7.7)
PLATELETS: 280 10*3/uL (ref 150–400)
RBC: 4.72 MIL/uL (ref 4.22–5.81)
RDW: 12.9 % (ref 11.5–15.5)
WBC: 8.3 10*3/uL (ref 4.0–10.5)

## 2014-06-20 LAB — LIPASE, BLOOD: LIPASE: 14 U/L (ref 11–59)

## 2014-06-20 LAB — D-DIMER, QUANTITATIVE (NOT AT ARMC): D DIMER QUANT: 0.31 ug{FEU}/mL (ref 0.00–0.48)

## 2014-06-20 MED ORDER — METHADONE HCL 10 MG PO TABS: 50.0000 mg | ORAL_TABLET | Freq: Three times a day (TID) | ORAL | Status: DC

## 2014-06-20 MED ORDER — OXYCODONE HCL 30 MG PO TABS: 30.0000 mg | ORAL_TABLET | Freq: Three times a day (TID) | ORAL | Status: DC | PRN

## 2014-06-20 NOTE — Progress Notes (Signed)
[facility]  OFFICE PROGRESS NOTE  Maurilio Lovely, MD [address] [place] [address]  DIAGNOSIS: Localized swelling of both lower legs - Plan: methadone (DOLOPHINE) 10 MG tablet, oxycodone (ROXICODONE) 30 MG immediate release tablet, CBC with Differential, CBC with Differential, CBC with Differential  Idiopathic chronic pancreatitis - Plan: Lipase, blood, Lipase, blood, Lipase, blood, CBC with Differential  Hypercoagulable state - Plan: D-dimer, quantitative, CBC with Differential  Chief Complaint  Patient presents with  . Follow-up    CURRENT THERAPY: Methadone, oxycodone, Xarelto, Nexium, pancreatic enzymes.  INTERVAL HISTORY: Alex Hoffman 53 y.o. 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 Second attempt was performed on 04/10/2014 revealed a patulous EG junction, status post passage of a Maloney dilator with directions to take twice daily PPI and to resume Xarelto and pancreatic enzyme therapy. He has been extremely compliant with wearing bilateral lower extremity compression Mosher he has had vast improvement in the swelling in his legs. He still has discomfort however control with his current analgesic regimen without any episodes of diarrhea or vomiting. He does get occasional nausea but without abdominal distention, back pain, chest pain, PND, orthopnea, epistaxis, melena, hematochezia, or hematuria. He has been appointment with Dr. Logan Bores at [facility] for October 2015.    MEDICAL HISTORY: Past Medical History  Diagnosis Date  . Pulmonary embolism 12/2010  . Cystic thyroid nodule     left  . HTN (hypertension)   . Heterozygous factor V Leiden mutation 12/2010  . Pancreatitis 05/2011    EUS Dr Christella Hartigan 06/09/11-> dilated main pancreatic duct in HOP, 2-3 filling defects distal pancreatic duct, peripancreatic fluid resolved  . C.  difficile colitis 01/2011?  Marland Kitchen Arthritis   . Clotting disorder   . GERD (gastroesophageal reflux disease)   . Chills   . Fever   . Weight loss   . Hearing loss   . Nasal congestion   . Leg swelling   . Palpitations   . Abdominal distension   . Abdominal pain   . Nausea & vomiting   . Rectal pain   . Difficulty urinating   . Arthritis pain   . Headache(784.0)   . Weakness   . Bruises easily   . Anemia   . Dizziness   . Numbness and tingling     both hands, and both legs and feet  . Joint pain   . Blood in urine     hx of, none current  . Frequent urination at night   . Pneumonia as child  . Abnormal EKG     hx left bundle branch block on ekg  . Sleep apnea     STOPBANG=5  . Heterozygous factor V Leiden mutation   . Prostatitis, chronic   . Pancreatitis   . Pancreatitis 12/2011  . Arthritis     lower back. Dr. Charlynne Cousins in [place]  . GERD (gastroesophageal reflux disease)   . HTN (hypertension) 01/28/2014  . BPH (benign prostatic hyperplasia) 01/28/2014  . 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    ALLERGIES:  is allergic to reglan.  MEDICATIONS: has a current medication list which includes the following prescription(s): amlodipine, bisacodyl, diazepam, esomeprazole, ibuprofen, lidocaine-hydrocortisone ace, methadone, oxycodone, pancrelipase (lip-prot-amyl), phenazopyridine, prochlorperazine, rivaroxaban, silodosin, tizanidine, and zolpidem.  SURGICAL HISTORY:  Past Surgical History  Procedure Laterality Date  . Neck surgery  1999    C5/C6 fusion, limited neck  mobility especially turning to left  . Knee surgery  1980's    arthroscopy, left knee  . Femur fracture surgery  age 68    right leg  . Nerve surgery  1980's    right thumb  . Colonoscopy  01/2011    Dr. Zenon Mayo at [facility]-->normal  . Esophagogastroduodenoscopy  01/2011    Dr. Zenon Mayo at [facility]-->bile reflux in stomach-->patient c/o inadequate conscious sedation (Fentanyl 100mg /Versed 8mg )  . Ercp  07/2011    Dr. Logan Bores [facility]-> pancreatic enterotomy, pancreatic duct stones removed, bile duct not manipulated, no stents  . Cholecystectomy  01/25/2012    Procedure: LAPAROSCOPIC CHOLECYSTECTOMY WITH INTRAOPERATIVE CHOLANGIOGRAM;  Surgeon: Robyne Askew, MD;  Location: [facility];  Service: General;  Laterality: N/A;  . Cystoscopy with hydrodistension and biopsy  01/25/2012    Procedure: CYSTOSCOPY/BIOPSY/HYDRODISTENSION;  Surgeon: Anner Crete, MD;  Location: [facility];  Service: Urology;  Laterality: N/A;  Cystoscopy and Hydrodistension of the Bladder  . Colonoscopy with propofol  11/22/2012    WJX:BJYNWGN anal rectum/fissure not identified although he certainly could have a chronic anal fissure   . Esophagogastroduodenoscopy (egd) with propofol N/A 02/13/2014    FAO:[id number] EGD as described above s/p bx (mild changes of reflux on esophageal bx  . Esophageal biopsy N/A 02/13/2014    Procedure: BIOPSY;  Surgeon: Corbin Ade, MD;  Location: [facility];  Service: Endoscopy;  Laterality: N/A;  . Esophagogastroduodenoscopy (egd) with propofol N/A 04/10/2014    Dr. Jena Gauss: patulous EG junction, s/p 20 F Maloney dilation empirically  . Esophageal dilation N/A 04/10/2014    Procedure: ESOPHAGEAL DILATION;  Surgeon: Corbin Ade, MD;  Location: [facility];  Service: Endoscopy;  Laterality: N/A;  M[place] 54,     FAMILY HISTORY: family history includes Cancer in his maternal aunt and maternal grandfather; GI problems in his brother and another family member; Heart attack in his brother; Heart disease in his  maternal uncle; Hypertension in his father and mother; Stroke in his brother. There is no history of Colon cancer or Liver disease.  SOCIAL HISTORY:  reports that he quit smoking about 17 months ago. His smoking use included Cigarettes. He has a 35 pack-year smoking history. He has never used smokeless tobacco. He reports that he does not drink alcohol or use illicit drugs.  REVIEW OF SYSTEMS:  Other than that discussed above is noncontributory.  PHYSICAL EXAMINATION: ECOG PERFORMANCE STATUS: 1 - Symptomatic but completely ambulatory  Blood pressure 138/67, pulse 77, temperature 97.6 F (36.4 C), temperature source Oral, resp. rate 20, weight 212 lb 12.8 oz (96.525 kg), SpO2 100.00%.  GENERAL:alert, no distress and comfortable SKIN: skin color, texture, turgor are normal, no rashes or significant lesions EYES: PERLA; Conjunctiva are pink and non-injected, sclera clear SINUSES: No redness or tenderness over maxillary or ethmoid sinuses OROPHARYNX:no exudate, no erythema on lips, buccal mucosa, or tongue. NECK: supple, thyroid normal size, non-tender, without nodularity. No masses CHEST: Increased AP diameter with no breast masses. LYMPH:  no palpable lymphadenopathy in the cervical, axillary or inguinal LUNGS: clear to auscultation and percussion with normal breathing effort HEART: regular rate & rhythm and  no murmurs. ABDOMEN:abdomen soft, non-tender and normal bowel sounds. No rebound tenderness or CVA tenderness. MUSCULOSKELETAL: Lower extremity cyanosis below the ankles with evidence of varicosities. Negative Homans sign. NEURO: alert & oriented x 3 with fluent speech, no focal motor/sensory deficits   LABORATORY DATA: Office Visit on 06/20/2014  Component Date Value Ref Range Status  . WBC 06/20/2014 8.3  4.0 - 10.5 K/uL Final  . RBC 06/20/2014 4.72  4.22 - 5.81 MIL/uL Final  . Hemoglobin 06/20/2014 13.1  13.0 - 17.0 g/dL Final  . HCT 16/08/9603 39.3  39.0 - 52.0 % Final  . MCV  06/20/2014 83.3  78.0 - 100.0 fL Final  . MCH 06/20/2014 27.8  26.0 - 34.0 pg Final  . MCHC 06/20/2014 33.3  30.0 - 36.0 g/dL Final  . RDW 54/07/8118 12.9  11.5 - 15.5 % Final  . Platelets 06/20/2014 280  150 - 400 K/uL Final  . Neutrophils Relative % 06/20/2014 54  43 - 77 % Final  . Neutro Abs 06/20/2014 4.5  1.7 - 7.7 K/uL Final  . Lymphocytes Relative 06/20/2014 36  12 - 46 % Final  . Lymphs Abs 06/20/2014 3.0  0.7 - 4.0 K/uL Final  . Monocytes Relative 06/20/2014 6  3 - 12 % Final  . Monocytes Absolute 06/20/2014 0.5  0.1 - 1.0 K/uL Final  . Eosinophils Relative 06/20/2014 3  0 - 5 % Final  . Eosinophils Absolute 06/20/2014 0.3  0.0 - 0.7 K/uL Final  . Basophils Relative 06/20/2014 1  0 - 1 % Final  . Basophils Absolute 06/20/2014 0.0  0.0 - 0.1 K/uL Final  . Lipase 06/20/2014 14  11 - 59 U/L Final  . D-Dimer, Quant 06/20/2014 0.31  0.00 - 0.48 ug/mL-FEU Final   Comment:                                 AT THE INHOUSE ESTABLISHED CUTOFF                          VALUE OF 0.48 ug/mL FEU,                          THIS ASSAY HAS BEEN DOCUMENTED                          IN THE LITERATURE TO HAVE                          A SENSITIVITY AND NEGATIVE                          PREDICTIVE VALUE OF AT LEAST                          98 TO 99%.  THE TEST RESULT                          SHOULD BE CORRELATED WITH                          AN ASSESSMENT OF THE CLINICAL  PROBABILITY OF DVT / VTE.  Lab on 05/23/2014  Component Date Value Ref Range Status  . WBC 05/23/2014 9.4  4.0 - 10.5 K/uL Final  . RBC 05/23/2014 4.64  4.22 - 5.81 MIL/uL Final  . Hemoglobin 05/23/2014 13.2  13.0 - 17.0 g/dL Final  . HCT 16/08/9603 38.6* 39.0 - 52.0 % Final  . MCV 05/23/2014 83.2  78.0 - 100.0 fL Final  . MCH 05/23/2014 28.4  26.0 - 34.0 pg Final  . MCHC 05/23/2014 34.2  30.0 - 36.0 g/dL Final  . RDW 54/07/8118 12.7  11.5 - 15.5 % Final  . Platelets 05/23/2014 294  150 - 400 K/uL Final    . Neutrophils Relative % 05/23/2014 58  43 - 77 % Final  . Neutro Abs 05/23/2014 5.4  1.7 - 7.7 K/uL Final  . Lymphocytes Relative 05/23/2014 34  12 - 46 % Final  . Lymphs Abs 05/23/2014 3.2  0.7 - 4.0 K/uL Final  . Monocytes Relative 05/23/2014 6  3 - 12 % Final  . Monocytes Absolute 05/23/2014 0.6  0.1 - 1.0 K/uL Final  . Eosinophils Relative 05/23/2014 2  0 - 5 % Final  . Eosinophils Absolute 05/23/2014 0.2  0.0 - 0.7 K/uL Final  . Basophils Relative 05/23/2014 0  0 - 1 % Final  . Basophils Absolute 05/23/2014 0.0  0.0 - 0.1 K/uL Final  . D-Dimer, Quant 05/23/2014 <0.27  0.00 - 0.48 ug/mL-FEU Final   Comment:                                 AT THE INHOUSE ESTABLISHED CUTOFF                          VALUE OF 0.48 ug/mL FEU,                          THIS ASSAY HAS BEEN DOCUMENTED                          IN THE LITERATURE TO HAVE                          A SENSITIVITY AND NEGATIVE                          PREDICTIVE VALUE OF AT LEAST                          98 TO 99%.  THE TEST RESULT                          SHOULD BE CORRELATED WITH                          AN ASSESSMENT OF THE CLINICAL                          PROBABILITY OF DVT / VTE.    PATHOLOGY: No new pathology.  Urinalysis    Component Value Date/Time   COLORURINE YELLOW 01/28/2014 1504   APPEARANCEUR CLEAR 01/28/2014 1504   LABSPEC >1.030* 01/28/2014 1504   PHURINE 5.5 01/28/2014 1504  GLUCOSEU NEGATIVE 01/28/2014 1504   HGBUR NEGATIVE 01/28/2014 1504   BILIRUBINUR NEGATIVE 01/28/2014 1504   KETONESUR NEGATIVE 01/28/2014 1504   PROTEINUR NEGATIVE 01/28/2014 1504   UROBILINOGEN 0.2 01/28/2014 1504   NITRITE NEGATIVE 01/28/2014 1504   LEUKOCYTESUR NEGATIVE 01/28/2014 1504    RADIOGRAPHIC STUDIES: No results found.  ASSESSMENT:  #1. Chronic abdominal pain from previous bouts of pancreatitis with bilateral postphlebitic lower extremity pain.  #2. Factor V Leiden mutation on lifelong Xarelto.  #3. Chronic obstructive  pulmonary disease, no longer smoking.  #4. Chronic abdominal pain from previous pancreatitis episodes, controlled with methadone and oxycodone.  #5. Recurrent hematuria, currently resolved.  #6. Bilateral lower extremity varicosities with ulceration,  vascular surgery evaluation on 04/29/2014  without indication for any surgical procedure. #7. Patulous EG junction, status post dilation, not much improvement in symptoms    PLAN:  #1. [facility]. #2. Continue oxycodone, Xarelto, methadone, pancreatic enzymes, and Nexium not twice a day. #3. Followup in 4 weeks with CBC and d-dimer along with lipase.   All questions were answered. The patient knows to call the clinic with any problems, questions or concerns. We can certainly see the patient much sooner if necessary.   I spent 25 minutes counseling the patient face to face. The total time spent in the appointment was 30 minutes.    Maurilio LovelyFormanek, Jinna Weinman A, MD 06/21/2014 10:18 AM  DISCLAIMER:  This note was dictated with voice recognition software.  Similar sounding words can inadvertently be transcribed inaccurately and may not be corrected upon review.

## 2014-06-20 NOTE — Progress Notes (Signed)
Alex Hoffman presented for labwork. Labs per MD order drawn via Peripheral Line 23 gauge needle inserted in right AC.  Good blood return present. Procedure without incident.  Needle removed intact. Patient tolerated procedure well.   

## 2014-06-20 NOTE — Patient Instructions (Signed)
[facility] Discharge Instructions  RECOMMENDATIONS MADE BY THE CONSULTANT AND ANY TEST RESULTS WILL BE SENT TO YOUR REFERRING PHYSICIAN.  EXAM FINDINGS BY THE PHYSICIAN TODAY AND SIGNS OR SYMPTOMS TO REPORT TO CLINIC OR PRIMARY PHYSICIAN: Exam and findings as discussed by Dr. Zigmund DanielFormanek.  You are doing well, legs look good.  No changes in therapy.  MEDICATIONS PRESCRIBED:  Refills for Oxycodone and Methadone  INSTRUCTIONS/FOLLOW-UP: Follow-up in 1 month with labs and office visit.  Thank you for choosing [facility] to provide your oncology and hematology care.  To afford each patient quality time with our providers, please arrive at least 15 minutes before your scheduled appointment time.  With your help, our goal is to use those 15 minutes to complete the necessary work-up to ensure our physicians have the information they need to help with your evaluation and healthcare recommendations.    Effective January 1st, 2014, we ask that you re-schedule your appointment with our physicians should you arrive 10 or more minutes late for your appointment.  We strive to give you quality time with our providers, and arriving late affects you and other patients whose appointments are after yours.    Again, thank you for choosing [facility].  Our hope is that these requests will decrease the amount of time that you wait before being seen by our physicians.       _____________________________________________________________  Should you have questions after your visit to [facility], please contact our office at [phone] between the hours of 8:30 a.m. and 4:30 p.m.  Voicemails left after 4:30 p.m. will not be returned until the following business day.  For prescription refill requests, have your pharmacy contact our office with your prescription refill request.    _______________________________________________________________  We hope that  we have given you very good care.  You may receive a patient satisfaction survey in the mail, please complete it and return it as soon as possible.  We value your feedback!  _______________________________________________________________  Have you asked about our STAR program?  STAR stands for Survivorship Training and Rehabilitation, and this is a nationally recognized cancer care program that focuses on survivorship and rehabilitation.  Cancer and cancer treatments may cause problems, such as, pain, making you feel tired and keeping you from doing the things that you need or want to do. Cancer rehabilitation can help. Our goal is to reduce these troubling effects and help you have the best quality of life possible.  You may receive a survey from a nurse that asks questions about your current state of health.  Based on the survey results, all eligible patients will be referred to the [facility] program for an evaluation so we can better serve you!  A frequently asked questions sheet is available upon request.

## 2014-06-24 ENCOUNTER — Other Ambulatory Visit (HOSPITAL_COMMUNITY): Payer: Self-pay | Admitting: Hematology and Oncology

## 2014-06-24 ENCOUNTER — Other Ambulatory Visit (HOSPITAL_COMMUNITY): Payer: Self-pay | Admitting: Oncology

## 2014-06-24 DIAGNOSIS — D6859 Other primary thrombophilia: Secondary | ICD-10-CM

## 2014-06-24 MED ORDER — RIVAROXABAN 20 MG PO TABS: 20.0000 mg | ORAL_TABLET | Freq: Every day | ORAL | Status: DC

## 2014-07-10 ENCOUNTER — Encounter: Payer: Self-pay | Admitting: Internal Medicine

## 2014-07-10 NOTE — Progress Notes (Signed)
Quick Note:  Let's get him back in to set up EGD/ED. ______

## 2014-07-10 NOTE — Progress Notes (Signed)
APPOINTMENT MADE, LETTER SENT  °

## 2014-07-18 ENCOUNTER — Encounter (HOSPITAL_COMMUNITY): Payer: Medicare Other | Attending: Hematology and Oncology

## 2014-07-18 ENCOUNTER — Encounter (HOSPITAL_BASED_OUTPATIENT_CLINIC_OR_DEPARTMENT_OTHER): Payer: Medicare Other

## 2014-07-18 ENCOUNTER — Encounter (HOSPITAL_COMMUNITY): Payer: Self-pay

## 2014-07-18 VITALS — Wt 216.6 lb

## 2014-07-18 DIAGNOSIS — Z7901 Long term (current) use of anticoagulants: Secondary | ICD-10-CM | POA: Diagnosis not present

## 2014-07-18 DIAGNOSIS — M7989 Other specified soft tissue disorders: Secondary | ICD-10-CM | POA: Diagnosis not present

## 2014-07-18 DIAGNOSIS — K861 Other chronic pancreatitis: Secondary | ICD-10-CM

## 2014-07-18 DIAGNOSIS — I8390 Asymptomatic varicose veins of unspecified lower extremity: Secondary | ICD-10-CM

## 2014-07-18 DIAGNOSIS — D6859 Other primary thrombophilia: Secondary | ICD-10-CM

## 2014-07-18 DIAGNOSIS — R109 Unspecified abdominal pain: Secondary | ICD-10-CM

## 2014-07-18 DIAGNOSIS — G4700 Insomnia, unspecified: Secondary | ICD-10-CM

## 2014-07-18 DIAGNOSIS — M79609 Pain in unspecified limb: Secondary | ICD-10-CM | POA: Diagnosis not present

## 2014-07-18 DIAGNOSIS — R2243 Localized swelling, mass and lump, lower limb, bilateral: Secondary | ICD-10-CM

## 2014-07-18 DIAGNOSIS — I87009 Postthrombotic syndrome without complications of unspecified extremity: Secondary | ICD-10-CM

## 2014-07-18 LAB — CBC WITH DIFFERENTIAL/PLATELET
BASOS ABS: 0 10*3/uL (ref 0.0–0.1)
BASOS PCT: 0 % (ref 0–1)
Eosinophils Absolute: 0.3 10*3/uL (ref 0.0–0.7)
Eosinophils Relative: 3 % (ref 0–5)
HEMATOCRIT: 38.1 % — AB (ref 39.0–52.0)
Hemoglobin: 13.1 g/dL (ref 13.0–17.0)
LYMPHS PCT: 39 % (ref 12–46)
Lymphs Abs: 3.8 10*3/uL (ref 0.7–4.0)
MCH: 28.2 pg (ref 26.0–34.0)
MCHC: 34.4 g/dL (ref 30.0–36.0)
MCV: 81.9 fL (ref 78.0–100.0)
MONO ABS: 0.5 10*3/uL (ref 0.1–1.0)
Monocytes Relative: 5 % (ref 3–12)
Neutro Abs: 5.2 10*3/uL (ref 1.7–7.7)
Neutrophils Relative %: 53 % (ref 43–77)
PLATELETS: 328 10*3/uL (ref 150–400)
RBC: 4.65 MIL/uL (ref 4.22–5.81)
RDW: 13.2 % (ref 11.5–15.5)
WBC: 9.9 10*3/uL (ref 4.0–10.5)

## 2014-07-18 LAB — LIPASE, BLOOD: Lipase: 11 U/L (ref 11–59)

## 2014-07-18 LAB — D-DIMER, QUANTITATIVE: D-Dimer, Quant: 0.32 ug/mL-FEU (ref 0.00–0.48)

## 2014-07-18 MED ORDER — ZOLPIDEM TARTRATE 5 MG PO TABS: 5.0000 mg | ORAL_TABLET | Freq: Every evening | ORAL | Status: DC | PRN

## 2014-07-18 MED ORDER — DIAZEPAM 5 MG PO TABS: 5.0000 mg | ORAL_TABLET | Freq: Four times a day (QID) | ORAL | Status: DC | PRN

## 2014-07-18 MED ORDER — METHADONE HCL 10 MG PO TABS: 50.0000 mg | ORAL_TABLET | Freq: Three times a day (TID) | ORAL | Status: DC

## 2014-07-18 MED ORDER — ZOLPIDEM TARTRATE 5 MG PO TABS
5.0000 mg | ORAL_TABLET | Freq: Every evening | ORAL | Status: DC | PRN
Start: 2014-07-18 — End: 2014-07-18

## 2014-07-18 MED ORDER — OXYCODONE HCL 30 MG PO TABS: 30.0000 mg | ORAL_TABLET | Freq: Three times a day (TID) | ORAL | Status: DC | PRN

## 2014-07-18 MED ORDER — OXYCODONE HCL 30 MG PO TABS: ORAL_TABLET | ORAL | Status: DC

## 2014-07-18 NOTE — Patient Instructions (Signed)
[facility] Discharge Instructions  RECOMMENDATIONS MADE BY THE CONSULTANT AND ANY TEST RESULTS WILL BE SENT TO YOUR REFERRING PHYSICIAN.  EXAM FINDINGS BY THE PHYSICIAN TODAY AND SIGNS OR SYMPTOMS TO REPORT TO CLINIC OR PRIMARY PHYSICIAN: Exam and findings as discussed by Dr. Zigmund Daniel.  No changes in therapy.  Keep scheduled appointments with Dr. Logan Bores and Dr. Jena Gauss.  MEDICATIONS PRESCRIBED:  Refills for: Ambien Valium Oxycodone Methadone  INSTRUCTIONS/FOLLOW-UP: Follow-up in 4 weeks.  Thank you for choosing [facility] to provide your oncology and hematology care.  To afford each patient quality time with our providers, please arrive at least 15 minutes before your scheduled appointment time.  With your help, our goal is to use those 15 minutes to complete the necessary work-up to ensure our physicians have the information they need to help with your evaluation and healthcare recommendations.    Effective January 1st, 2014, we ask that you re-schedule your appointment with our physicians should you arrive 10 or more minutes late for your appointment.  We strive to give you quality time with our providers, and arriving late affects you and other patients whose appointments are after yours.    Again, thank you for choosing [facility].  Our hope is that these requests will decrease the amount of time that you wait before being seen by our physicians.       _____________________________________________________________  Should you have questions after your visit to [facility], please contact our office at [phone] between the hours of 8:30 a.m. and 4:30 p.m.  Voicemails left after 4:30 p.m. will not be returned until the following business day.  For prescription refill requests, have your pharmacy contact our office with your prescription refill request.    _______________________________________________________________  We  hope that we have given you very good care.  You may receive a patient satisfaction survey in the mail, please complete it and return it as soon as possible.  We value your feedback!  _______________________________________________________________  Have you asked about our STAR program?  STAR stands for Survivorship Training and Rehabilitation, and this is a nationally recognized cancer care program that focuses on survivorship and rehabilitation.  Cancer and cancer treatments may cause problems, such as, pain, making you feel tired and keeping you from doing the things that you need or want to do. Cancer rehabilitation can help. Our goal is to reduce these troubling effects and help you have the best quality of life possible.  You may receive a survey from a nurse that asks questions about your current state of health.  Based on the survey results, all eligible patients will be referred to the [facility] program for an evaluation so we can better serve you!  A frequently asked questions sheet is available upon request.

## 2014-07-18 NOTE — Progress Notes (Signed)
[facility]  OFFICE PROGRESS NOTE  Maurilio Lovely, MD [address] [place] [address]  DIAGNOSIS: Localized swelling of both lower legs - Plan: methadone (DOLOPHINE) 10 MG tablet, oxycodone (ROXICODONE) 30 MG immediate release tablet, CBC with Differential, Lipase, blood, DISCONTINUED: oxycodone (ROXICODONE) 30 MG immediate release tablet  Hypercoagulable state - Plan: CBC with Differential, Lipase, blood  Postphlebitic syndrome - Plan: CBC with Differential, Lipase, blood  Idiopathic chronic pancreatitis  Insomnia - Plan: zolpidem (AMBIEN) 5 MG tablet, DISCONTINUED: zolpidem (AMBIEN) 5 MG tablet, DISCONTINUED: zolpidem (AMBIEN) 5 MG tablet  Chief Complaint  Patient presents with  . Hypercoagulable state    CURRENT THERAPY: Methadone, oxycodone, Xarelto, Nexium, pancreatic enzymes.  INTERVAL HISTORY: Alex Hoffman 53 y.o. 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 Second attempt was performed on 04/10/2014 revealed a patulous EG junction, status post passage of a Maloney dilator with directions to take twice daily PPI and to resume Xarelto and pancreatic enzyme therapy. As an appointment to see Dr. Logan Bores at [facility] in October 2015. He continues to experience lower extremity pain with ulcerations. He does wear lower extremity TED stockings. He denies any chest pain, PND, orthopnea, epistaxis, melena, hematochezia, or hematuria. He continues on Xarelto. He denies any headache, focal weakness, and does have appointment scheduled for gastroenterology and October.    MEDICAL HISTORY: Past Medical History  Diagnosis Date  . Pulmonary embolism 12/2010  . Cystic thyroid nodule     left  . HTN (hypertension)   . Heterozygous factor V Leiden mutation 12/2010  . Pancreatitis 05/2011    [facility] Dr Christella Hartigan 06/09/11-> dilated main pancreatic duct in  HOP, 2-3 filling defects distal pancreatic duct, peripancreatic fluid resolved  . C. difficile colitis 01/2011?  Marland Kitchen Arthritis   . Clotting disorder   . GERD (gastroesophageal reflux disease)   . Chills   . Fever   . Weight loss   . Hearing loss   . Nasal congestion   . Leg swelling   . Palpitations   . Abdominal distension   . Abdominal pain   . Nausea & vomiting   . Rectal pain   . Difficulty urinating   . Arthritis pain   . Headache(784.0)   . Weakness   . Bruises easily   . Anemia   . Dizziness   . Numbness and tingling     both hands, and both legs and feet  . Joint pain   . Blood in urine     hx of, none current  . Frequent urination at night   . Pneumonia as child  . Abnormal EKG     hx left bundle branch block on ekg  . Sleep apnea     STOPBANG=5  . Heterozygous factor V Leiden mutation   . Prostatitis, chronic   . Pancreatitis   . Pancreatitis 12/2011  . Arthritis     lower back. Dr. Charlynne Cousins in [place]  . GERD (gastroesophageal reflux disease)   . HTN (hypertension) 01/28/2014  . BPH (benign prostatic hyperplasia) 01/28/2014  . 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    ALLERGIES:  is allergic to reglan.  MEDICATIONS: has a current medication list which includes the following prescription(s): amlodipine, bisacodyl, diazepam, esomeprazole, ibuprofen, lidocaine-hydrocortisone ace, methadone, oxycodone, pancrelipase (lip-prot-amyl), phenazopyridine, prochlorperazine, rivaroxaban, silodosin, tizanidine, and zolpidem.  SURGICAL HISTORY:  Past Surgical  History  Procedure Laterality Date  . Neck surgery  1999    C5/C6 fusion, limited neck mobility especially turning to left  . Knee surgery  1980's    arthroscopy, left knee  . Femur fracture surgery  age 46    right leg  . Nerve surgery  1980's    right thumb  . Colonoscopy  01/2011    Dr. Zenon Mayo at [facility]-->normal  . Esophagogastroduodenoscopy  01/2011    Dr. Zenon Mayo at [facility]-->bile reflux in stomach-->patient c/o inadequate conscious sedation (Fentanyl /Versed )  . Ercp  07/2011    Dr. Logan Bores [facility]-> pancreatic enterotomy, pancreatic duct stones removed, bile duct not manipulated, no stents  . Cholecystectomy  01/25/2012    Procedure: LAPAROSCOPIC CHOLECYSTECTOMY WITH INTRAOPERATIVE CHOLANGIOGRAM;  Surgeon: Robyne Askew, MD;  Location: [facility];  Service: General;  Laterality: N/A;  . Cystoscopy with hydrodistension and biopsy  01/25/2012    Procedure: CYSTOSCOPY/BIOPSY/HYDRODISTENSION;  Surgeon: Anner Crete, MD;  Location: [facility];  Service: Urology;  Laterality: N/A;  Cystoscopy and Hydrodistension of the Bladder  . 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 Esophagogastroduodenoscopy (egd) with propofol N/A 02/13/2014    [id number] EGD as described above s/p bx (mild changes of reflux on esophageal bx  . Esophageal biopsy N/A 02/13/2014    Procedure: BIOPSY;  Surgeon: Corbin Ade, MD;  Location: [facility];  Service: Endoscopy;  Laterality: N/A;  . Esophagogastroduodenoscopy (egd) with propofol N/A 04/10/2014    Dr. Jena Gauss: patulous EG junction, s/p 34 F Maloney dilation empirically  . Esophageal dilation N/A 04/10/2014    Procedure: ESOPHAGEAL DILATION;  Surgeon: Corbin Ade, MD;  Location: [facility];  Service: Endoscopy;  Laterality: N/A;  [place] 54,     FAMILY HISTORY: family history includes Cancer in his maternal aunt and maternal grandfather; GI problems in his  brother and another family member; Heart attack in his brother; Heart disease in his maternal uncle; Hypertension in his father and mother; Stroke in his brother. There is no history of Colon cancer or Liver disease.  SOCIAL HISTORY:  reports that he quit smoking about 18 months ago. His smoking use included Cigarettes. He has a 35 pack-year smoking history. He has never used smokeless tobacco. He reports that he does not drink alcohol or use illicit drugs.  REVIEW OF SYSTEMS:  Other than that discussed above is noncontributory.  PHYSICAL EXAMINATION: ECOG PERFORMANCE STATUS: 1 - Symptomatic but completely ambulatory  Weight 216 lb 9.6 oz (98.249 kg).  GENERAL:alert, no distress and comfortable SKIN: skin color, texture, turgor are normal, no rashes or significant lesions EYES: PERLA; Conjunctiva are pink and non-injected, sclera clear SINUSES: No redness or tenderness over maxillary or ethmoid sinuses OROPHARYNX:no exudate, no erythema on lips, buccal mucosa, or tongue. NECK: supple, thyroid normal size, non-tender, without nodularity. No masses CHEST: Increased AP diameter with no breast masses. LYMPH:  no palpable lymphadenopathy in the cervical, axillary or inguinal LUNGS: clear to auscultation and percussion with normal breathing effort HEART: regular rate & rhythm and no murmurs. ABDOMEN:abdomen soft, non-tender and  normal bowel sounds MUSCULOSKELETAL:no cyanosis of digits and no clubbing. Range of motion normal. Bilateral lower extremity swelling with varicosities but negative Homans sign. There is skin breakdown on the dorsal surface of both feet. There is no evidence of purulence or hemorrhage. NEURO: alert & oriented x 3 with fluent speech, no focal motor/sensory deficits   LABORATORY DATA: Lab on 07/18/2014  Component Date Value Ref Range Status  . D-Dimer, Quant 07/18/2014 0.32  0.00 - 0.48 ug/mL-FEU Final   Comment:                                 AT THE INHOUSE  ESTABLISHED CUTOFF                          VALUE OF 0.48 ug/mL FEU,                          THIS ASSAY HAS BEEN DOCUMENTED                          IN THE LITERATURE TO HAVE                          A SENSITIVITY AND NEGATIVE                          PREDICTIVE VALUE OF AT LEAST                          98 TO 99%.  THE TEST RESULT                          SHOULD BE CORRELATED WITH                          AN ASSESSMENT OF THE CLINICAL                          PROBABILITY OF DVT / VTE.  . Lipase 07/18/2014 11  11 - 59 U/L Final  . WBC 07/18/2014 9.9  4.0 - 10.5 K/uL Final  . RBC 07/18/2014 4.65  4.22 - 5.81 MIL/uL Final  . Hemoglobin 07/18/2014 13.1  13.0 - 17.0 g/dL Final  . HCT 16/08/9603 38.1* 39.0 - 52.0 % Final  . MCV 07/18/2014 81.9  78.0 - 100.0 fL Final  . MCH 07/18/2014 28.2  26.0 - 34.0 pg Final  . MCHC 07/18/2014 34.4  30.0 - 36.0 g/dL Final  . RDW 54/07/8118 13.2  11.5 - 15.5 % Final  . Platelets 07/18/2014 328  150 - 400 K/uL Final  . Neutrophils Relative % 07/18/2014 53  43 - 77 % Final  . Neutro Abs 07/18/2014 5.2  1.7 - 7.7 K/uL Final  . Lymphocytes Relative 07/18/2014 39  12 - 46 % Final  . Lymphs Abs 07/18/2014 3.8  0.7 - 4.0 K/uL Final  . Monocytes Relative 07/18/2014 5  3 - 12 % Final  . Monocytes Absolute 07/18/2014 0.5  0.1 - 1.0 K/uL Final  . Eosinophils Relative 07/18/2014 3  0 - 5 % Final  . Eosinophils Absolute 07/18/2014 0.3  0.0 - 0.7 K/uL Final  .  Basophils Relative 07/18/2014 0  0 - 1 % Final  . Basophils Absolute 07/18/2014 0.0  0.0 - 0.1 K/uL Final  Office Visit on 06/20/2014  Component Date Value Ref Range Status  . WBC 06/20/2014 8.3  4.0 - 10.5 K/uL Final  . RBC 06/20/2014 4.72  4.22 - 5.81 MIL/uL Final  . Hemoglobin 06/20/2014 13.1  13.0 - 17.0 g/dL Final  . HCT 40/98/1191 39.3  39.0 - 52.0 % Final  . MCV 06/20/2014 83.3  78.0 - 100.0 fL Final  . MCH 06/20/2014 27.8  26.0 - 34.0 pg Final  . MCHC 06/20/2014 33.3  30.0 - 36.0 g/dL Final  . RDW  47/82/9562 12.9  11.5 - 15.5 % Final  . Platelets 06/20/2014 280  150 - 400 K/uL Final  . Neutrophils Relative % 06/20/2014 54  43 - 77 % Final  . Neutro Abs 06/20/2014 4.5  1.7 - 7.7 K/uL Final  . Lymphocytes Relative 06/20/2014 36  12 - 46 % Final  . Lymphs Abs 06/20/2014 3.0  0.7 - 4.0 K/uL Final  . Monocytes Relative 06/20/2014 6  3 - 12 % Final  . Monocytes Absolute 06/20/2014 0.5  0.1 - 1.0 K/uL Final  . Eosinophils Relative 06/20/2014 3  0 - 5 % Final  . Eosinophils Absolute 06/20/2014 0.3  0.0 - 0.7 K/uL Final  . Basophils Relative 06/20/2014 1  0 - 1 % Final  . Basophils Absolute 06/20/2014 0.0  0.0 - 0.1 K/uL Final  . Lipase 06/20/2014 14  11 - 59 U/L Final  . D-Dimer, Quant 06/20/2014 0.31  0.00 - 0.48 ug/mL-FEU Final   Comment:                                 AT THE INHOUSE ESTABLISHED CUTOFF                          VALUE OF 0.48 ug/mL FEU,                          THIS ASSAY HAS BEEN DOCUMENTED                          IN THE LITERATURE TO HAVE                          A SENSITIVITY AND NEGATIVE                          PREDICTIVE VALUE OF AT LEAST                          98 TO 99%.  THE TEST RESULT                          SHOULD BE CORRELATED WITH                          AN ASSESSMENT OF THE CLINICAL                          PROBABILITY OF DVT / VTE.    PATHOLOGY: No new pathology.  Urinalysis  Component Value Date/Time   COLORURINE YELLOW 01/28/2014 1504   APPEARANCEUR CLEAR 01/28/2014 1504   LABSPEC >1.030* 01/28/2014 1504   PHURINE 5.5 01/28/2014 1504   GLUCOSEU NEGATIVE 01/28/2014 1504   HGBUR NEGATIVE 01/28/2014 1504   BILIRUBINUR NEGATIVE 01/28/2014 1504   KETONESUR NEGATIVE 01/28/2014 1504   PROTEINUR NEGATIVE 01/28/2014 1504   UROBILINOGEN 0.2 01/28/2014 1504   NITRITE NEGATIVE 01/28/2014 1504   LEUKOCYTESUR NEGATIVE 01/28/2014 1504    RADIOGRAPHIC STUDIES: No results found.  ASSESSMENT:  #1. Chronic abdominal pain from previous bouts of pancreatitis  with bilateral postphlebitic lower extremity pain.  #2. Factor V Leiden mutation on lifelong Xarelto.  #3. Chronic obstructive pulmonary disease, no longer smoking.  #4. Chronic abdominal pain from previous pancreatitis episodes, controlled with methadone and oxycodone.  #5. Recurrent hematuria, currently resolved.  #6. Bilateral lower extremity varicosities with ulceration, vascular surgery evaluation on 04/29/2014 without indication for any surgical procedure.  #7. Patulous EG junction, status post dilation, not much improvement in symptoms       PLAN:  #1. Followup with Dr. Jena Gauss on 08/12/2014. #2. Evaluation by Dr. Logan Bores on 08/21/2014 at [facility]. #3. Continue current medications including zolpidem, methadone, oxycodone, Valium, and pancreatic enzymes. #4. Followup in 4 weeks. CBC and d-dimer along with lipase.   All questions were answered. The patient knows to call the clinic with any problems, questions or concerns. We can certainly see the patient much sooner if necessary.   I spent 25 minutes counseling the patient face to face. The total time spent in the appointment was 30 minutes.    Maurilio Lovely, MD 07/18/2014 2:21 PM  DISCLAIMER:  This note was dictated with voice recognition software.  Similar sounding words can inadvertently be transcribed inaccurately and may not be corrected upon review.

## 2014-07-18 NOTE — Progress Notes (Signed)
LABS FOR CBCD,LIPASE,DDIM

## 2014-08-05 ENCOUNTER — Other Ambulatory Visit (HOSPITAL_COMMUNITY): Payer: Self-pay | Admitting: Hematology and Oncology

## 2014-08-06 ENCOUNTER — Other Ambulatory Visit (HOSPITAL_COMMUNITY): Payer: Self-pay | Admitting: Oncology

## 2014-08-06 DIAGNOSIS — G4700 Insomnia, unspecified: Secondary | ICD-10-CM

## 2014-08-06 MED ORDER — ZOLPIDEM TARTRATE 5 MG PO TABS: 5.0000 mg | ORAL_TABLET | Freq: Every evening | ORAL | Status: DC | PRN

## 2014-08-12 ENCOUNTER — Ambulatory Visit: Payer: Medicare Other | Admitting: Gastroenterology

## 2014-08-14 ENCOUNTER — Ambulatory Visit (HOSPITAL_COMMUNITY): Payer: Medicare Other

## 2014-08-14 ENCOUNTER — Other Ambulatory Visit (HOSPITAL_COMMUNITY): Payer: Medicare Other

## 2014-08-18 ENCOUNTER — Encounter (HOSPITAL_COMMUNITY): Payer: Medicare Other | Attending: Hematology and Oncology

## 2014-08-18 ENCOUNTER — Encounter (HOSPITAL_COMMUNITY): Payer: Self-pay

## 2014-08-18 ENCOUNTER — Encounter (HOSPITAL_BASED_OUTPATIENT_CLINIC_OR_DEPARTMENT_OTHER): Payer: Medicare Other

## 2014-08-18 VITALS — BP 111/78 | HR 64 | Temp 98.2°F | Resp 18 | Wt 222.0 lb

## 2014-08-18 DIAGNOSIS — J439 Emphysema, unspecified: Secondary | ICD-10-CM

## 2014-08-18 DIAGNOSIS — K861 Other chronic pancreatitis: Secondary | ICD-10-CM

## 2014-08-18 DIAGNOSIS — D6859 Other primary thrombophilia: Secondary | ICD-10-CM

## 2014-08-18 DIAGNOSIS — R2243 Localized swelling, mass and lump, lower limb, bilateral: Secondary | ICD-10-CM | POA: Insufficient documentation

## 2014-08-18 DIAGNOSIS — I87009 Postthrombotic syndrome without complications of unspecified extremity: Secondary | ICD-10-CM | POA: Insufficient documentation

## 2014-08-18 DIAGNOSIS — G4700 Insomnia, unspecified: Secondary | ICD-10-CM | POA: Diagnosis not present

## 2014-08-18 LAB — D-DIMER, QUANTITATIVE (NOT AT ARMC): D DIMER QUANT: 0.27 ug{FEU}/mL (ref 0.00–0.48)

## 2014-08-18 LAB — CBC WITH DIFFERENTIAL/PLATELET
BASOS ABS: 0 10*3/uL (ref 0.0–0.1)
Basophils Relative: 0 % (ref 0–1)
EOS ABS: 0.3 10*3/uL (ref 0.0–0.7)
Eosinophils Relative: 4 % (ref 0–5)
HCT: 36.6 % — ABNORMAL LOW (ref 39.0–52.0)
HEMOGLOBIN: 12 g/dL — AB (ref 13.0–17.0)
Lymphocytes Relative: 44 % (ref 12–46)
Lymphs Abs: 3.9 10*3/uL (ref 0.7–4.0)
MCH: 27.9 pg (ref 26.0–34.0)
MCHC: 32.8 g/dL (ref 30.0–36.0)
MCV: 85.1 fL (ref 78.0–100.0)
Monocytes Absolute: 0.4 10*3/uL (ref 0.1–1.0)
Monocytes Relative: 5 % (ref 3–12)
NEUTROS ABS: 4.2 10*3/uL (ref 1.7–7.7)
NEUTROS PCT: 47 % (ref 43–77)
Platelets: 308 10*3/uL (ref 150–400)
RBC: 4.3 MIL/uL (ref 4.22–5.81)
RDW: 13.8 % (ref 11.5–15.5)
WBC: 8.9 10*3/uL (ref 4.0–10.5)

## 2014-08-18 LAB — LIPASE, BLOOD: Lipase: 14 U/L (ref 11–59)

## 2014-08-18 MED ORDER — OXYCODONE HCL 30 MG PO TABS: ORAL_TABLET | ORAL | Status: DC

## 2014-08-18 MED ORDER — METHADONE HCL 10 MG PO TABS: 50.0000 mg | ORAL_TABLET | Freq: Three times a day (TID) | ORAL | Status: DC

## 2014-08-18 MED ORDER — ZOLPIDEM TARTRATE 5 MG PO TABS: 5.0000 mg | ORAL_TABLET | Freq: Every evening | ORAL | Status: DC | PRN

## 2014-08-18 NOTE — Progress Notes (Signed)
Alex Hoffman L Lazaro presented for labwork. Labs per MD order drawn via Peripheral Line 25 gauge needle inserted in lt ac.  Good blood return present. Procedure without incident.  Needle removed intact. Patient tolerated procedure well.

## 2014-08-18 NOTE — Patient Instructions (Signed)
[facility] Discharge Instructions  RECOMMENDATIONS MADE BY THE CONSULTANT AND ANY TEST RESULTS WILL BE SENT TO YOUR REFERRING PHYSICIAN.  We will see you in 1 month for a doctor's appointment and repeat lab work. Please call for any questions or concerns.    Thank you for choosing [facility] to provide your oncology and hematology care.  To afford each patient quality time with our providers, please arrive at least 15 minutes before your scheduled appointment time.  With your help, our goal is to use those 15 minutes to complete the necessary work-up to ensure our physicians have the information they need to help with your evaluation and healthcare recommendations.    Effective January 1st, 2014, we ask that you re-schedule your appointment with our physicians should you arrive 10 or more minutes late for your appointment.  We strive to give you quality time with our providers, and arriving late affects you and other patients whose appointments are after yours.    Again, thank you for choosing [facility].  Our hope is that these requests will decrease the amount of time that you wait before being seen by our physicians.       _____________________________________________________________  Should you have questions after your visit to [facility], please contact our office at [phone] between the hours of 8:30 a.m. and 4:30 p.m.  Voicemails left after 4:30 p.m. will not be returned until the following business day.  For prescription refill requests, have your pharmacy contact our office with your prescription refill request.    _______________________________________________________________  We hope that we have given you very good care.  You may receive a patient satisfaction survey in the mail, please complete it and return it as soon as possible.  We value your  feedback!  _______________________________________________________________  Have you asked about our STAR program?  STAR stands for Survivorship Training and Rehabilitation, and this is a nationally recognized cancer care program that focuses on survivorship and rehabilitation.  Cancer and cancer treatments may cause problems, such as, pain, making you feel tired and keeping you from doing the things that you need or want to do. Cancer rehabilitation can help. Our goal is to reduce these troubling effects and help you have the best quality of life possible.  You may receive a survey from a nurse that asks questions about your current state of health.  Based on the survey results, all eligible patients will be referred to the [facility] program for an evaluation so we can better serve you!  A frequently asked questions sheet is available upon request.

## 2014-08-18 NOTE — Progress Notes (Signed)
LABS FOR LIPASE,DDIM,CBCD

## 2014-08-18 NOTE — Progress Notes (Unsigned)
[facility]  OFFICE PROGRESS NOTE  Maurilio Lovely, MD [address] [place] [address]  DIAGNOSIS: Localized swelling of both lower legs - Plan: methadone (DOLOPHINE) 10 MG tablet, oxycodone (ROXICODONE) 30 MG immediate release tablet  Chief Complaint  Patient presents with  . Chronic abdominal pain  . Thrombophilia  . Post phlebitic syndrome    CURRENT THERAPY: Methadone, oxycodone, Xarelto, Nexium, pancreatic enzymes.  INTERVAL HISTORY: Alex Hoffman 53 y.o. 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 Second attempt was performed on 04/10/2014 revealed a patulous EG junction, status post passage of a Maloney dilator with directions to take twice daily PPI and to resume Xarelto and pancreatic enzyme therapy. He still has occasional nausea control with medication. Denies any vomiting, diarrhea, constipation, but with lower extremity pain at remains severe. He denies any chest pain, PND, orthopnea, skin rash, headache, fever, night sweats, or seizures.    MEDICAL HISTORY: Past Medical History  Diagnosis Date  . Pulmonary embolism 12/2010  . Cystic thyroid nodule     left  . HTN (hypertension)   . Heterozygous factor V Leiden mutation 12/2010  . Pancreatitis 05/2011    [facility] Dr Christella Hartigan 06/09/11-> dilated main pancreatic duct in HOP, 2-3 filling defects distal pancreatic duct, peripancreatic fluid resolved  . C. difficile colitis 01/2011?  Marland Kitchen Arthritis   . Clotting disorder   . GERD (gastroesophageal reflux disease)   . Chills   . Fever   . Weight loss   . Hearing loss   . Nasal congestion   . Leg swelling   . Palpitations   . Abdominal distension   . Abdominal pain   . Nausea & vomiting   . Rectal pain   . Difficulty urinating   . Arthritis pain   . Headache(784.0)   . Weakness   . Bruises easily   . Anemia   . Dizziness   . Numbness  and tingling     both hands, and both legs and feet  . Joint pain   . Blood in urine     hx of, none current  . Frequent urination at night   . Pneumonia as child  . Abnormal EKG     hx left bundle branch block on ekg  . Sleep apnea     STOPBANG=5  . Heterozygous factor V Leiden mutation   . Prostatitis, chronic   . Pancreatitis   . Pancreatitis 12/2011  . Arthritis     lower back. Dr. Charlynne Cousins in [place]  . GERD (gastroesophageal reflux disease)   . HTN (hypertension) 01/28/2014  . BPH (benign prostatic hyperplasia) 01/28/2014  . 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    ALLERGIES:  is allergic to reglan.  MEDICATIONS: has a current medication list which includes the following prescription(s): amlodipine, bisacodyl, diazepam, esomeprazole, ibuprofen, lidocaine-hydrocortisone ace, methadone, oxycodone, pancrelipase (lip-prot-amyl), phenazopyridine, prochlorperazine, rivaroxaban, silodosin, tizanidine, and zolpidem.  SURGICAL HISTORY:  Past Surgical History  Procedure Laterality Date  . Neck surgery  1999    C5/C6 fusion, limited neck mobility especially turning  to left  . Knee surgery  1980's    arthroscopy, left knee  . Femur fracture surgery  age 19    right leg  . Nerve surgery  1980's    right thumb  . Colonoscopy  01/2011    Dr. Zenon Mayo at [facility]-->normal  . Esophagogastroduodenoscopy  01/2011    Dr. Zenon Mayo at [facility]-->bile reflux in stomach-->patient c/o inadequate conscious sedation (Fentanyl 100mg /Versed 8mg )  . Ercp  07/2011    Dr. Logan Bores [facility]->  pancreatic enterotomy, pancreatic duct stones removed, bile duct not manipulated, no stents  . Cholecystectomy  01/25/2012    Procedure: LAPAROSCOPIC CHOLECYSTECTOMY WITH INTRAOPERATIVE CHOLANGIOGRAM;  Surgeon: Robyne Askew, MD;  Location: [facility];  Service: General;  Laterality: N/A;  . Cystoscopy with hydrodistension and biopsy  01/25/2012    Procedure: CYSTOSCOPY/BIOPSY/HYDRODISTENSION;  Surgeon: Anner Crete, MD;  Location: [facility];  Service: Urology;  Laterality: N/A;  Cystoscopy and Hydrodistension of the Bladder  . Colonoscopy with propofol  11/22/2012    UJW:[id number] anal rectum/fissure not identified although he certainly could have a chronic anal fissure   . Esophagogastroduodenoscopy (egd) with propofol N/A 02/13/2014    [id number] EGD as described above s/p bx (mild changes of reflux on esophageal bx  . Esophageal biopsy N/A 02/13/2014    Procedure: BIOPSY;  Surgeon: Corbin Ade, MD;  Location: [facility];  Service: Endoscopy;  Laterality: N/A;  . Esophagogastroduodenoscopy (egd) with propofol N/A 04/10/2014    Dr. Jena Gauss: patulous EG junction, s/p 17 F Maloney dilation empirically  . Esophageal dilation N/A 04/10/2014    Procedure: ESOPHAGEAL DILATION;  Surgeon: Corbin Ade, MD;  Location: [facility];  Service: Endoscopy;  Laterality: N/A;  M[place] 54,     FAMILY HISTORY: family history includes Cancer in his maternal aunt and maternal grandfather; GI problems in his brother and another family member; Heart attack in his brother; Heart disease in his maternal uncle; Hypertension in his father and mother; Stroke in his brother. There is no history of Colon cancer or Liver disease.  SOCIAL HISTORY:  reports that he quit smoking about 19 months ago. His smoking use included Cigarettes. He has a 35 pack-year smoking history. He has never used smokeless tobacco. He reports that he does not drink alcohol or use illicit drugs.  REVIEW OF SYSTEMS:  Other than that discussed above is  noncontributory.  PHYSICAL EXAMINATION: ECOG PERFORMANCE STATUS: 1 - Symptomatic but completely ambulatory  There were no vitals taken for this visit.  GENERAL:alert, no distress and comfortable SKIN: skin color, texture, turgor are normal, no rashes or significant lesions EYES: PERLA; Conjunctiva are pink and non-injected, sclera clear SINUSES: No redness or tenderness over maxillary or ethmoid sinuses OROPHARYNX:no exudate, no erythema on lips, buccal mucosa, or tongue. NECK: supple, thyroid normal size, non-tender, without nodularity. No masses CHEST: Increased AP diameter with no breast masses. LYMPH:  no palpable lymphadenopathy in the cervical, axillary or inguinal LUNGS: clear to auscultation and percussion with normal breathing effort HEART: regular rate & rhythm and no murmurs. ABDOMEN:abdomen soft, non-tender and normal bowel sounds. No organomegaly, ascites, or CVA tenderness. MUSCULOSKELETAL:no cyanosis of digits and no clubbing. Range of motion normal. . Chronic venous stasis with tenderness to mild palpation, negative Homan sign. NEURO: alert & oriented x 3 with fluent speech, no focal motor/sensory deficits   LABORATORY DATA: No visits with results within 30 Day(s) from this visit. Latest known visit with results is:  Lab on 07/18/2014  Component Date  Value Ref Range Status  . D-Dimer, Quant 07/18/2014 0.32  0.00 - 0.48 ug/mL-FEU Final   Comment:                                 AT THE INHOUSE ESTABLISHED CUTOFF                          VALUE OF 0.48 ug/mL FEU,                          THIS ASSAY HAS BEEN DOCUMENTED                          IN THE LITERATURE TO HAVE                          A SENSITIVITY AND NEGATIVE                          PREDICTIVE VALUE OF AT LEAST                          98 TO 99%.  THE TEST RESULT                          SHOULD BE CORRELATED WITH                          AN ASSESSMENT OF THE CLINICAL                          PROBABILITY  OF DVT / VTE.  . Lipase 07/18/2014 11  11 - 59 U/L Final  . WBC 07/18/2014 9.9  4.0 - 10.5 K/uL Final  . RBC 07/18/2014 4.65  4.22 - 5.81 MIL/uL Final  . Hemoglobin 07/18/2014 13.1  13.0 - 17.0 g/dL Final  . HCT 16/10/960409/18/2015 38.1* 39.0 - 52.0 % Final  . MCV 07/18/2014 81.9  78.0 - 100.0 fL Final  . MCH 07/18/2014 28.2  26.0 - 34.0 pg Final  . MCHC 07/18/2014 34.4  30.0 - 36.0 g/dL Final  . RDW 54/09/811909/18/2015 13.2  11.5 - 15.5 % Final  . Platelets 07/18/2014 328  150 - 400 K/uL Final  . Neutrophils Relative % 07/18/2014 53  43 - 77 % Final  . Neutro Abs 07/18/2014 5.2  1.7 - 7.7 K/uL Final  . Lymphocytes Relative 07/18/2014 39  12 - 46 % Final  . Lymphs Abs 07/18/2014 3.8  0.7 - 4.0 K/uL Final  . Monocytes Relative 07/18/2014 5  3 - 12 % Final  . Monocytes Absolute 07/18/2014 0.5  0.1 - 1.0 K/uL Final  . Eosinophils Relative 07/18/2014 3  0 - 5 % Final  . Eosinophils Absolute 07/18/2014 0.3  0.0 - 0.7 K/uL Final  . Basophils Relative 07/18/2014 0  0 - 1 % Final  . Basophils Absolute 07/18/2014 0.0  0.0 - 0.1 K/uL Final    PATHOLOGY: No new pathology.  Urinalysis    Component Value Date/Time   COLORURINE YELLOW 01/28/2014 1504   APPEARANCEUR CLEAR 01/28/2014 1504   LABSPEC >1.030* 01/28/2014 1504   PHURINE 5.5 01/28/2014 1504   GLUCOSEU NEGATIVE  01/28/2014 1504   HGBUR NEGATIVE 01/28/2014 1504   BILIRUBINUR NEGATIVE 01/28/2014 1504   KETONESUR NEGATIVE 01/28/2014 1504   PROTEINUR NEGATIVE 01/28/2014 1504   UROBILINOGEN 0.2 01/28/2014 1504   NITRITE NEGATIVE 01/28/2014 1504   LEUKOCYTESUR NEGATIVE 01/28/2014 1504    RADIOGRAPHIC STUDIES: No results found.  ASSESSMENT:  #1. Chronic abdominal pain from previous bouts of pancreatitis with bilateral postphlebitic lower extremity pain.  #2. Factor V Leiden mutation on lifelong Xarelto.  #3. Chronic obstructive pulmonary disease, no longer smoking.  #4. Chronic abdominal pain from previous pancreatitis episodes, controlled with methadone and  oxycodone.  #5. Recurrent hematuria, currently resolved.  #6. Bilateral lower extremity varicosities with ulceration, vascular surgery evaluation on 04/29/2014 without indication for any surgical procedure.  #7. Patulous EG junction, status post dilation, not much improvement in symptoms        PLAN:  #1. [facility] #2. Continue current medications including zolpidem, methadone, oxycodone, Nexium, and pancreatic enzymes. #3. Followup in 4 weeks with CBC, d-dimer, and lipase   All questions were answered. The patient knows to call the clinic with any problems, questions or concerns. We can certainly see the patient much sooner if necessary.   I spent 25 minutes counseling the patient face to face. The total time spent in the appointment was 30 minutes.    Maurilio LovelyFormanek, Kazuko Clemence A, MD 08/18/2014 11:23 AM  DISCLAIMER:  This note was dictated with voice recognition software.  Similar sounding words can inadvertently be transcribed inaccurately and may not be corrected upon review.

## 2014-08-21 DIAGNOSIS — R1314 Dysphagia, pharyngoesophageal phase: Secondary | ICD-10-CM | POA: Diagnosis not present

## 2014-08-21 DIAGNOSIS — D6851 Activated protein C resistance: Secondary | ICD-10-CM | POA: Diagnosis not present

## 2014-08-21 DIAGNOSIS — M7989 Other specified soft tissue disorders: Secondary | ICD-10-CM | POA: Diagnosis not present

## 2014-08-21 DIAGNOSIS — K861 Other chronic pancreatitis: Secondary | ICD-10-CM | POA: Diagnosis not present

## 2014-08-29 DIAGNOSIS — K861 Other chronic pancreatitis: Secondary | ICD-10-CM | POA: Diagnosis not present

## 2014-09-01 ENCOUNTER — Ambulatory Visit: Payer: Medicare Other | Admitting: Gastroenterology

## 2014-09-01 ENCOUNTER — Encounter: Payer: Self-pay | Admitting: Gastroenterology

## 2014-09-18 ENCOUNTER — Encounter (HOSPITAL_COMMUNITY): Payer: Self-pay

## 2014-09-18 ENCOUNTER — Encounter (HOSPITAL_BASED_OUTPATIENT_CLINIC_OR_DEPARTMENT_OTHER): Payer: Medicare Other

## 2014-09-18 ENCOUNTER — Encounter (HOSPITAL_COMMUNITY): Payer: Medicare Other | Attending: Hematology and Oncology

## 2014-09-18 VITALS — BP 146/81 | HR 76 | Temp 98.7°F | Resp 16 | Wt 217.8 lb

## 2014-09-18 DIAGNOSIS — K859 Acute pancreatitis, unspecified: Secondary | ICD-10-CM

## 2014-09-18 DIAGNOSIS — R197 Diarrhea, unspecified: Secondary | ICD-10-CM | POA: Diagnosis not present

## 2014-09-18 DIAGNOSIS — I87009 Postthrombotic syndrome without complications of unspecified extremity: Secondary | ICD-10-CM | POA: Diagnosis not present

## 2014-09-18 DIAGNOSIS — G4700 Insomnia, unspecified: Secondary | ICD-10-CM | POA: Diagnosis not present

## 2014-09-18 DIAGNOSIS — D6851 Activated protein C resistance: Secondary | ICD-10-CM

## 2014-09-18 DIAGNOSIS — R0602 Shortness of breath: Secondary | ICD-10-CM

## 2014-09-18 DIAGNOSIS — D6859 Other primary thrombophilia: Secondary | ICD-10-CM | POA: Diagnosis not present

## 2014-09-18 DIAGNOSIS — K858 Other acute pancreatitis without necrosis or infection: Secondary | ICD-10-CM

## 2014-09-18 DIAGNOSIS — R2243 Localized swelling, mass and lump, lower limb, bilateral: Secondary | ICD-10-CM | POA: Diagnosis not present

## 2014-09-18 LAB — CBC WITH DIFFERENTIAL/PLATELET
BASOS ABS: 0 10*3/uL (ref 0.0–0.1)
BASOS PCT: 0 % (ref 0–1)
Eosinophils Absolute: 0.3 10*3/uL (ref 0.0–0.7)
Eosinophils Relative: 2 % (ref 0–5)
HCT: 37.2 % — ABNORMAL LOW (ref 39.0–52.0)
Hemoglobin: 12.5 g/dL — ABNORMAL LOW (ref 13.0–17.0)
LYMPHS PCT: 31 % (ref 12–46)
Lymphs Abs: 3.8 10*3/uL (ref 0.7–4.0)
MCH: 28.5 pg (ref 26.0–34.0)
MCHC: 33.6 g/dL (ref 30.0–36.0)
MCV: 84.7 fL (ref 78.0–100.0)
MONO ABS: 0.8 10*3/uL (ref 0.1–1.0)
Monocytes Relative: 7 % (ref 3–12)
NEUTROS PCT: 60 % (ref 43–77)
Neutro Abs: 7.2 10*3/uL (ref 1.7–7.7)
Platelets: 368 10*3/uL (ref 150–400)
RBC: 4.39 MIL/uL (ref 4.22–5.81)
RDW: 13.4 % (ref 11.5–15.5)
WBC: 12.1 10*3/uL — AB (ref 4.0–10.5)

## 2014-09-18 LAB — LIPASE, BLOOD: LIPASE: 11 U/L (ref 11–59)

## 2014-09-18 LAB — D-DIMER, QUANTITATIVE (NOT AT ARMC)

## 2014-09-18 MED ORDER — METHADONE HCL 10 MG PO TABS: 50.0000 mg | ORAL_TABLET | Freq: Three times a day (TID) | ORAL | Status: DC

## 2014-09-18 MED ORDER — OXYCODONE HCL 30 MG PO TABS: ORAL_TABLET | ORAL | Status: DC

## 2014-09-18 MED ORDER — DIAZEPAM 5 MG PO TABS: 5.0000 mg | ORAL_TABLET | Freq: Four times a day (QID) | ORAL | Status: DC | PRN

## 2014-09-18 NOTE — Progress Notes (Signed)
ed inaccurately and may not be corrected upon review.

## 2014-09-18 NOTE — Patient Instructions (Signed)
[facility] Discharge Instructions  RECOMMENDATIONS MADE BY THE CONSULTANT AND ANY TEST RESULTS WILL BE SENT TO YOUR REFERRING PHYSICIAN.  EXAM FINDINGS BY THE PHYSICIAN TODAY AND SIGNS OR SYMPTOMS TO REPORT TO CLINIC OR PRIMARY PHYSICIAN: Exam and findings as discussed by Dr. Zigmund DanielFormanek.  MEDICATIONS PRESCRIBED:  Refills for Methadone, oxycodone and valium.  INSTRUCTIONS/FOLLOW-UP: Follow-up in 4 weeks with labs and office visit.  Thank you for choosing [facility] to provide your oncology and hematology care.  To afford each patient quality time with our providers, please arrive at least 15 minutes before your scheduled appointment time.  With your help, our goal is to use those 15 minutes to complete the necessary work-up to ensure our physicians have the information they need to help with your evaluation and healthcare recommendations.    Effective January 1st, 2014, we ask that you re-schedule your appointment with our physicians should you arrive 10 or more minutes late for your appointment.  We strive to give you quality time with our providers, and arriving late affects you and other patients whose appointments are after yours.    Again, thank you for choosing [facility].  Our hope is that these requests will decrease the amount of time that you wait before being seen by our physicians.       _____________________________________________________________  Should you have questions after your visit to [facility], please contact our office at [phone] between the hours of 8:30 a.m. and 4:30 p.m.  Voicemails left after 4:30 p.m. will not be returned until the following business day.  For prescription refill requests, have your pharmacy contact our office with your prescription refill request.    _______________________________________________________________  We hope that we have given you very good care.  You may receive  a patient satisfaction survey in the mail, please complete it and return it as soon as possible.  We value your feedback!  _______________________________________________________________  Have you asked about our STAR program?  STAR stands for Survivorship Training and Rehabilitation, and this is a nationally recognized cancer care program that focuses on survivorship and rehabilitation.  Cancer and cancer treatments may cause problems, such as, pain, making you feel tired and keeping you from doing the things that you need or want to do. Cancer rehabilitation can help. Our goal is to reduce these troubling effects and help you have the best quality of life possible.  You may receive a survey from a nurse that asks questions about your current state of health.  Based on the survey results, all eligible patients will be referred [facility] the [facility] for an evaluation so we can better serve you!  A frequently asked questions sheet is available upon request.

## 2014-09-18 NOTE — Progress Notes (Signed)
[facility]  OFFICE PROGRESS NOTE  Maurilio LovelyFormanek, Christena Sunderlin A, MD [address] [place] [id number]  DIAGNOSIS: Localized swelling of both lower legs - Plan: methadone (DOLOPHINE) 10 MG tablet, oxycodone (ROXICODONE) 30 MG immediate release tablet, CBC with Differential, D-dimer, quantitative, Lipase, blood  Other pancreatitis - Plan: CBC with Differential, D-dimer, quantitative, Lipase, blood  Chief Complaint  Patient presents with  . PE hx  . Follow-up  . Thrombophilia  . Chronic abdominal pain    CURRENT THERAPY: Methadone, oxycodone, Xarelto, Nexium, Valium, and pancreatic enzymes.  INTERVAL HISTORY: Alex PernaDavid L Seel 53 y.o. 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 Second attempt was performed on 04/10/2014 revealed a patulous EG junction, status post passage of a Maloney dilator with directions to take twice daily PPI and to resume Xarelto and pancreatic enzyme therapy. He has been complaining of increasing shortness of breath and does have an [facility]. He's had occasional bowel distention without diarrhea or constipation. He denies any melena, hematochezia, or hematemesis. He does have [facility] on [facility]2014. Pain is controlled on his current regimen. He denies any vomiting but does have nausea which is controlled well with 5 mg of Compazine to 3 times per day. He denies a worsening lower 70 swelling, PND, orthopnea, or palpitations.  MEDICAL HISTORY: Past Medical History  Diagnosis Date  . Pulmonary embolism 12/2010  . Cystic thyroid nodule     left  . HTN (hypertension)   . Heterozygous factor V Leiden mutation 12/2010  . Pancreatitis 05/2011    EUS Dr Christella HartiganJacobs 06/09/11-> dilated main pancreatic duct in HOP, 2-3 filling defects distal pancreatic duct, peripancreatic fluid resolved  . C.  difficile colitis 01/2011?  Marland Kitchen. Arthritis   . Clotting disorder   . GERD (gastroesophageal reflux disease)   . Chills   . Fever   . Weight loss   . Hearing loss   . Nasal congestion   . Leg swelling   . Palpitations   . Abdominal distension   . Abdominal pain   . Nausea & vomiting   . Rectal pain   . Difficulty urinating   . Arthritis pain   . Headache(784.0)   . Weakness   . Bruises easily   . Anemia   . Dizziness   . Numbness and tingling     both hands, and both legs and feet  . Joint pain   . Blood in urine     hx of, none current  . Frequent urination at night   . Pneumonia as child  . Abnormal EKG     hx left bundle branch block on ekg  . Sleep apnea     STOPBANG=5  . Heterozygous factor V Leiden mutation   . Prostatitis, chronic   . Pancreatitis   . Pancreatitis 12/2011  . Arthritis     lower back. Dr. Ch[place]nne [place]abanza in [place]  . GERD (gastroesophageal reflux disease)   . HTN (hypertension) 01/28/2014  . BPH (benign prostatic hyperplasia) 01/28/2014  . 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    ALLERGIES:  is allergic to reglan.  MEDICATIONS: has a current medication list which includes the following prescription(s): amlodipine, bisacodyl, diazepam, esomeprazole, ibuprofen, lidocaine-hydrocortisone ace, methadone, oxycodone, pancrelipase (lip-prot-amyl), phenazopyridine, prochlorperazine, silodosin, tizanidine, zolpidem, and rivaroxaban.  SURGICAL HISTORY:  Past Surgical History  Procedure Laterality Date  . Neck surgery  1999    C5/C6 fusion, limited neck  mobility especially turning to left  . Knee surgery  1980's    arthroscopy, left knee  . Femur fracture surgery  age 35    right leg  . Nerve surgery  1980's    right thumb  . Colonoscopy  01/2011    [facility]. [facility]Brian Beachum at [facility]-->normal  . Esophagogastroduodenoscopy  01/2011    Dr. Zenon MayoBrian Beachum at [facility]-->bile reflux in stomach-->patient c/o inadequate conscious sedation (Fentanyl 100mg /Versed 8mg )  . Ercp  07/2011    Dr. Logan [facility]oresEvans [facility]-> pancreatic enterotomy, pancreatic duct stones removed, bile duct not manipulated, no stents  . Cholecystectomy  01/25/2012    Procedure: LAPAROSCOPIC CHOLECYSTECTOMY WITH INTRAOPERATIVE CHOLANGIOGRAM;  Surgeon: Robyne AskewPaul S Toth III, MD;  Location: [facility];  Service: General;  Laterality: N/A;  . Cystoscopy with hydrodistension and biopsy  01/25/2012    Procedure: CYSTOSCOPY/BIOPSY/HYDRODISTENSION;  Surgeon: Anner CreteJohn J Wrenn, MD;  Location: [facility];  Service: Urology;  Laterality: N/A;  Cystoscopy and Hydrodistension of the Bladder  . Colonoscopy with propofol  11/22/2012    UJW:[id number]:friable anal rectum/fissure not identified although he certainly could have a chronic anal fissure   . Esophagogastroduodenoscopy (egd) with propofol N/A 02/13/2014    GNF:[id number]:Incomplete EGD as described above s/p bx (mild changes of reflux on esophageal bx  . Esophageal biopsy N/A 02/13/2014    Procedure: BIOPSY;  Surgeon: Corbin Adeobert M Rourk, MD;  Location: [facility];  Service: Endoscopy;  Laterality: N/A;  . Esophagogastroduodenoscopy (egd) with propofol N/A 04/10/2014    Dr. Jena Gaussourk: patulous EG junction, s/p 9856 F Maloney dilation empirically  . Esophageal dilation N/A 04/10/2014    Procedure: ESOPHAGEAL DILATION;  Surgeon: Corbin Adeobert M Rourk, MD;  Location: [facility];  Service: Endoscopy;  Laterality: N/A;  Malloney 54,     FAMILY HISTORY: family history includes Cancer in his maternal aunt and maternal grandfather; GI problems in his brother and another family member; Heart attack in his brother; Heart disease in his  maternal uncle; Hypertension in his father and mother; Stroke in his brother. There is no history of Colon cancer or Liver disease.  SOCIAL HISTORY:  reports that he quit smoking about 20 months ago. His smoking use included Cigarettes. He has a 35 pack-year smoking history. He has never used smokeless tobacco. He reports that he does not drink alcohol or use illicit drugs.  REVIEW OF SYSTEMS:  Other than that discussed above is noncontributory.  PHYSICAL EXAMINATION: ECOG PERFORMANCE STATUS: 1 - Symptomatic but completely ambulatory  Blood pressure 146/81, pulse 76, temperature 98.7 F (37.1 C), temperature source Oral, resp. rate 16, weight 217 lb 12.8 oz (98.793 kg), SpO2 100 %.  GENERAL:alert, no distress and comfortable SKIN: skin color, texture, turgor are normal, no rashes or significant lesions EYES: PERLA; Conjunctiva are pink and non-injected, sclera clear SINUSES: No redness or tenderness over maxillary or ethmoid sinuses OROPHARYNX:no exudate, no erythema on lips, buccal mucosa, or tongue. NECK: supple, thyroid normal size, non-tender, without nodularity. No masses CHEST: Increased AP diameter with no breast masses. LYMPH:  no palpable lymphadenopathy in the cervical, axillary or inguinal LUNGS: clear to auscultation and  percussion with normal breathing effort HEART: regular rate & rhythm and no murmurs. ABDOMEN:abdomen soft, non-tender and normal bowel sounds. Tenderness to touch. MUSCULOSKELETAL:no cyanosis of digits and no clubbing. Range of motion normal. Bilateral lower extremity varicosities with tenderness. NEURO: alert & oriented x 3 with fluent speech, no focal motor/sensory deficits   LABORATORY DATA: Lab on 09/18/2014  Component Date Value Ref Range Status  . WBC 09/18/2014 12.1* 4.0 - 10.5 K/uL Final  . RBC 09/18/2014 4.39  4.22 - 5.81 MIL/uL Final  . Hemoglobin 09/18/2014 12.5* 13.0 - 17.0 g/dL Final  . HCT 14/78/2956 37.2* 39.0 - 52.0 % Final  . MCV  09/18/2014 84.7  78.0 - 100.0 fL Final  . MCH 09/18/2014 28.5  26.0 - 34.0 pg Final  . MCHC 09/18/2014 33.6  30.0 - 36.0 g/dL Final  . RDW 21/30/8657 13.4  11.5 - 15.5 % Final  . Platelets 09/18/2014 368  150 - 400 K/uL Final  . Neutrophils Relative % 09/18/2014 60  43 - 77 % Final  . Neutro Abs 09/18/2014 7.2  1.7 - 7.7 K/uL Final  . Lymphocytes Relative 09/18/2014 31  12 - 46 % Final  . Lymphs Abs 09/18/2014 3.8  0.7 - 4.0 K/uL Final  . Monocytes Relative 09/18/2014 7  3 - 12 % Final  . Monocytes Absolute 09/18/2014 0.8  0.1 - 1.0 K/uL Final  . Eosinophils Relative 09/18/2014 2  0 - 5 % Final  . Eosinophils Absolute 09/18/2014 0.3  0.0 - 0.7 K/uL Final  . Basophils Relative 09/18/2014 0  0 - 1 % Final  . Basophils Absolute 09/18/2014 0.0  0.0 - 0.1 K/uL Final  . Lipase 09/18/2014 11  11 - 59 U/L Final    PATHOLOGY: No new pathology.  Urinalysis    Component Value Date/Time   COLORURINE YELLOW 01/28/2014 1504   APPEARANCEUR CLEAR 01/28/2014 1504   LABSPEC >1.030* 01/28/2014 1504   PHURINE 5.5 01/28/2014 1504   GLUCOSEU NEGATIVE 01/28/2014 1504   HGBUR NEGATIVE 01/28/2014 1504   BILIRUBINUR NEGATIVE 01/28/2014 1504   KETONESUR NEGATIVE 01/28/2014 1504   PROTEINUR NEGATIVE 01/28/2014 1504   UROBILINOGEN 0.2 01/28/2014 1504   NITRITE NEGATIVE 01/28/2014 1504   LEUKOCYTESUR NEGATIVE 01/28/2014 1504    RADIOGRAPHIC STUDIES: No results found.  ASSESSMENT:  #1. Chronic abdominal pain from previous bouts of pancreatitis with bilateral postphlebitic lower extremity pain.  #2. Factor V Leiden mutation on lifelong Xarelto.  #3. Chronic obstructive pulmonary disease, no longer smoking.  #4. Chronic abdominal pain from previous pancreatitis episodes, controlled with methadone and oxycodone.  #5. Recurrent hematuria, currently resolved.  #6. Bilateral lower extremity varicosities with ulceration, vascular surgery evaluation on 04/29/2014 without indication for any surgical  procedure.  #7. Patulous EG junction, status post dilation, not much improvement in symptoms . #8. Postphlebitic syndrome both lower extremities.   PLAN:  #1. Continue zolpidem, methadone, oxycodone, Nexium, pancreatic enzymes, Valium, Xarelto. #2. Follow-up with [facility] appointments in November and December with cardiology and gastroenterology. #3. Follow-up in 4 weeks with CBC, d-dimer, and lipase.   All questions were answered. The patient knows to call the clinic with any problems, questions or concerns. We can certainly see the patient much sooner if necessary.   I spent 25 minutes counseling the patient face to face. The total time spent in the appointment was 30 minutes.    Maurilio Lovely, MD 09/18/2014 10:33 AM  DISCLAIMER:  This note was dictated with voice recognition software.  Similar sounding words  can inadvertently be transcribed inaccurately and may not be corrected upon review.

## 2014-09-18 NOTE — Progress Notes (Signed)
LABS FOR DDIM,LIPASE,CBCD

## 2014-09-29 DIAGNOSIS — R0609 Other forms of dyspnea: Secondary | ICD-10-CM | POA: Diagnosis not present

## 2014-09-29 DIAGNOSIS — I509 Heart failure, unspecified: Secondary | ICD-10-CM | POA: Diagnosis not present

## 2014-09-29 DIAGNOSIS — D6851 Activated protein C resistance: Secondary | ICD-10-CM | POA: Diagnosis not present

## 2014-09-29 DIAGNOSIS — D689 Coagulation defect, unspecified: Secondary | ICD-10-CM | POA: Diagnosis not present

## 2014-09-29 DIAGNOSIS — K861 Other chronic pancreatitis: Secondary | ICD-10-CM | POA: Diagnosis not present

## 2014-09-29 DIAGNOSIS — F419 Anxiety disorder, unspecified: Secondary | ICD-10-CM | POA: Diagnosis not present

## 2014-09-29 DIAGNOSIS — Z86711 Personal history of pulmonary embolism: Secondary | ICD-10-CM | POA: Diagnosis not present

## 2014-09-29 DIAGNOSIS — M7989 Other specified soft tissue disorders: Secondary | ICD-10-CM | POA: Diagnosis not present

## 2014-09-29 DIAGNOSIS — J302 Other seasonal allergic rhinitis: Secondary | ICD-10-CM | POA: Diagnosis not present

## 2014-09-29 DIAGNOSIS — K219 Gastro-esophageal reflux disease without esophagitis: Secondary | ICD-10-CM | POA: Diagnosis not present

## 2014-09-29 DIAGNOSIS — G8929 Other chronic pain: Secondary | ICD-10-CM | POA: Diagnosis not present

## 2014-09-29 DIAGNOSIS — I10 Essential (primary) hypertension: Secondary | ICD-10-CM | POA: Diagnosis not present

## 2014-09-29 DIAGNOSIS — F17210 Nicotine dependence, cigarettes, uncomplicated: Secondary | ICD-10-CM | POA: Diagnosis not present

## 2014-10-09 DIAGNOSIS — M7989 Other specified soft tissue disorders: Secondary | ICD-10-CM | POA: Diagnosis not present

## 2014-10-09 DIAGNOSIS — R0609 Other forms of dyspnea: Secondary | ICD-10-CM | POA: Diagnosis not present

## 2014-10-09 DIAGNOSIS — I517 Cardiomegaly: Secondary | ICD-10-CM | POA: Diagnosis not present

## 2014-10-15 DIAGNOSIS — Z7901 Long term (current) use of anticoagulants: Secondary | ICD-10-CM | POA: Diagnosis not present

## 2014-10-15 DIAGNOSIS — R1314 Dysphagia, pharyngoesophageal phase: Secondary | ICD-10-CM | POA: Diagnosis not present

## 2014-10-15 DIAGNOSIS — F17210 Nicotine dependence, cigarettes, uncomplicated: Secondary | ICD-10-CM | POA: Diagnosis not present

## 2014-10-15 DIAGNOSIS — M1990 Unspecified osteoarthritis, unspecified site: Secondary | ICD-10-CM | POA: Diagnosis not present

## 2014-10-15 DIAGNOSIS — Z86718 Personal history of other venous thrombosis and embolism: Secondary | ICD-10-CM | POA: Diagnosis not present

## 2014-10-15 DIAGNOSIS — D6851 Activated protein C resistance: Secondary | ICD-10-CM | POA: Diagnosis not present

## 2014-10-15 DIAGNOSIS — F419 Anxiety disorder, unspecified: Secondary | ICD-10-CM | POA: Diagnosis not present

## 2014-10-15 DIAGNOSIS — I209 Angina pectoris, unspecified: Secondary | ICD-10-CM | POA: Diagnosis not present

## 2014-10-15 DIAGNOSIS — J302 Other seasonal allergic rhinitis: Secondary | ICD-10-CM | POA: Diagnosis not present

## 2014-10-15 DIAGNOSIS — K219 Gastro-esophageal reflux disease without esophagitis: Secondary | ICD-10-CM | POA: Diagnosis not present

## 2014-10-15 DIAGNOSIS — R079 Chest pain, unspecified: Secondary | ICD-10-CM | POA: Diagnosis not present

## 2014-10-15 DIAGNOSIS — I10 Essential (primary) hypertension: Secondary | ICD-10-CM | POA: Diagnosis not present

## 2014-10-15 HISTORY — PX: CARDIAC CATHETERIZATION: SHX172

## 2014-10-16 ENCOUNTER — Other Ambulatory Visit (HOSPITAL_COMMUNITY): Payer: Self-pay | Admitting: Hematology and Oncology

## 2014-10-16 ENCOUNTER — Encounter (HOSPITAL_COMMUNITY): Payer: Medicare Other | Attending: Hematology and Oncology

## 2014-10-16 ENCOUNTER — Encounter (HOSPITAL_COMMUNITY): Payer: Self-pay

## 2014-10-16 ENCOUNTER — Encounter (HOSPITAL_BASED_OUTPATIENT_CLINIC_OR_DEPARTMENT_OTHER): Payer: Medicare Other

## 2014-10-16 VITALS — BP 123/74 | HR 58 | Temp 97.9°F | Resp 16 | Wt 214.5 lb

## 2014-10-16 DIAGNOSIS — Z7901 Long term (current) use of anticoagulants: Secondary | ICD-10-CM | POA: Diagnosis not present

## 2014-10-16 DIAGNOSIS — R2243 Localized swelling, mass and lump, lower limb, bilateral: Secondary | ICD-10-CM

## 2014-10-16 DIAGNOSIS — D6859 Other primary thrombophilia: Secondary | ICD-10-CM | POA: Diagnosis not present

## 2014-10-16 DIAGNOSIS — J439 Emphysema, unspecified: Secondary | ICD-10-CM

## 2014-10-16 DIAGNOSIS — G4700 Insomnia, unspecified: Secondary | ICD-10-CM | POA: Diagnosis not present

## 2014-10-16 DIAGNOSIS — I87009 Postthrombotic syndrome without complications of unspecified extremity: Secondary | ICD-10-CM | POA: Insufficient documentation

## 2014-10-16 DIAGNOSIS — K858 Other acute pancreatitis without necrosis or infection: Secondary | ICD-10-CM

## 2014-10-16 LAB — LIPASE, BLOOD: LIPASE: 11 U/L (ref 11–59)

## 2014-10-16 LAB — CBC WITH DIFFERENTIAL/PLATELET
Basophils Absolute: 0 10*3/uL (ref 0.0–0.1)
Basophils Relative: 0 % (ref 0–1)
Eosinophils Absolute: 0.4 10*3/uL (ref 0.0–0.7)
Eosinophils Relative: 4 % (ref 0–5)
HCT: 40 % (ref 39.0–52.0)
HEMOGLOBIN: 13 g/dL (ref 13.0–17.0)
LYMPHS ABS: 4.8 10*3/uL — AB (ref 0.7–4.0)
Lymphocytes Relative: 44 % (ref 12–46)
MCH: 28 pg (ref 26.0–34.0)
MCHC: 32.5 g/dL (ref 30.0–36.0)
MCV: 86.2 fL (ref 78.0–100.0)
MONOS PCT: 5 % (ref 3–12)
Monocytes Absolute: 0.6 10*3/uL (ref 0.1–1.0)
NEUTROS ABS: 5.2 10*3/uL (ref 1.7–7.7)
NEUTROS PCT: 47 % (ref 43–77)
Platelets: 356 10*3/uL (ref 150–400)
RBC: 4.64 MIL/uL (ref 4.22–5.81)
RDW: 13.3 % (ref 11.5–15.5)
WBC: 11 10*3/uL — ABNORMAL HIGH (ref 4.0–10.5)

## 2014-10-16 LAB — D-DIMER, QUANTITATIVE: D-Dimer, Quant: 0.38 ug/mL-FEU (ref 0.00–0.48)

## 2014-10-16 MED ORDER — OXYCODONE HCL 30 MG PO TABS: ORAL_TABLET | ORAL | Status: DC

## 2014-10-16 MED ORDER — METHADONE HCL 10 MG PO TABS: 50.0000 mg | ORAL_TABLET | Freq: Three times a day (TID) | ORAL | Status: DC

## 2014-10-16 NOTE — Progress Notes (Signed)
[facility]  OFFICE PROGRESS NOTE  Maurilio Lovely, MD [address] [place] [address]  DIAGNOSIS: Localized swelling of both lower legs - Plan: oxycodone (ROXICODONE) 30 MG immediate release tablet, methadone (DOLOPHINE) 10 MG tablet  Hypercoagulable state  Postphlebitic syndrome  Pulmonary emphysema, unspecified emphysema type  Chief Complaint  Patient presents with  . Follow-up  . Thrombophilia  . Chronic abdominal pain    CURRENT THERAPY: Methadone, oxycodone, Xarelto, Nexium, Valium, and pancreatic enzymes for chronic abdominal pain and thrombophilia.  INTERVAL HISTORY: Alex Hoffman 53 y.o. male returns for follow-up of thrombophilia and chronic pancreatic abdominal pain, status post aborted EGD on 02/13/2014 because of retained food. Second attempt was performed on 04/10/2014 revealed a patulous EG junction, status post passage of a Maloney dilator with directions to take twice daily PPI and to resume Xarelto and pancreatic enzyme therapy. He has an appointment with gastroenterology at [facility] on 10/29/2014 for EGD. He was evaluated by cardiology at [facility] on 09/29/2014 and underwent an echocardiogram as well as an attempted stress test which was impossible because of lower 7 extremity lpain. He did have a cardiac catheterization yesterday. Chronic lower extremity and abdominal pain persist on a level of 4/10. He denies any fever, night sweats, PND, orthopnea, or palpitations but has had shortness of breath of late and for that reason underwent cardiac evaluation. He denies any lower extremity redness but has had chronic swelling without chest pain, skin rash, headache, or seizures. He also denies epistaxis, melena, hematochezia, hematuria, or hemoptysis.   MEDICAL HISTORY: Past Medical History  Diagnosis Date  . Pulmonary embolism 12/2010  . Cystic thyroid nodule     left  . HTN (hypertension)   . Heterozygous factor V  Leiden mutation 12/2010  . Pancreatitis 05/2011    EUS Dr Christella Hartigan 06/09/11-> dilated main pancreatic duct in HOP, 2-3 filling defects distal pancreatic duct, peripancreatic fluid resolved  . C. difficile colitis 01/2011?  Marland Kitchen Arthritis   . Clotting disorder   . GERD (gastroesophageal reflux disease)   . Chills   . Fever   . Weight loss   . Hearing loss   . Nasal congestion   . Leg swelling   . Palpitations   . Abdominal distension   . Abdominal pain   . Nausea & vomiting   . Rectal pain   . Difficulty urinating   . Arthritis pain   . Headache(784.0)   . Weakness   . Bruises easily   . Anemia   . Dizziness   . Numbness and tingling     both hands, and both legs and feet  . Joint pain   . Blood in urine     hx of, none current  . Frequent urination at night   . Pneumonia as child  . Abnormal EKG     hx left bundle branch block on ekg  . Sleep apnea     STOPBANG=5  . Heterozygous factor V Leiden mutation   . Prostatitis, chronic   . Pancreatitis   . Pancreatitis 12/2011  . Arthritis     lower back. Dr. Charlynne Cousins in [place]  . GERD (gastroesophageal reflux disease)   . HTN (hypertension) 01/28/2014  . BPH (benign prostatic hyperplasia) 01/28/2014  . Factor V Leiden 07/19/2013    INTERIM HISTORY: has GERD (gastroesophageal reflux disease); Chronic abdominal pain; Dysphagia, unspecified; Rectal bleeding; Constipation; Pancreatitis; Abnormal LFTs; Hematuria, gross; Diarrhea; Hypokalemia; Cholecystitis;  RUQ pain; Factor V Leiden; Pain in limb; HTN (hypertension); BPH (benign prostatic hyperplasia); Nausea alone; and Varicose veins of lower extremities with ulcer and inflammation on his problem list.    ALLERGIES:  is allergic to reglan.  MEDICATIONS: has a current medication list which includes the following prescription(s): bisacodyl, carvedilol, diazepam, esomeprazole, ibuprofen, lidocaine-hydrocortisone ace, lisinopril, methadone, oxycodone, phenazopyridine, prochlorperazine,  rivaroxaban, silodosin, tizanidine, zolpidem, and pancrelipase (lip-prot-amyl).  SURGICAL HISTORY:  Past Surgical History  Procedure Laterality Date  . Neck surgery  1999    C5/C6 fusion, limited neck mobility especially turning to left  . Knee surgery  1980's    arthroscopy, left knee  . Femur fracture surgery  age 53    right leg  . Nerve surgery  1980's    right thumb  . Colonoscopy  01/2011    [facility]. [facility]Brian Beachum at [facility]-->normal  . Esophagogastroduodenoscopy  01/2011    Dr. Zenon MayoBrian Beachum at [facility]-->bile reflux in stomach-->patient c/o inadequate conscious sedation (Fentanyl 100mg /Versed 8mg )  . Ercp  07/2011    Dr. Logan BoresEvans [facility]-> pancreatic enterotomy, pancreatic duct stones removed, bile duct not manipulated, no stents  . Cholecystectomy  01/25/2012    Procedure: LAPAROSCOPIC CHOLECYSTECTOMY WITH INTRAOPERATIVE CHOLANGIOGRAM;  Surgeon: Robyne AskewPaul S Toth III, MD;  Location: [facility];  Service: General;  Laterality: N/A;  . Cystoscopy with hydrodistension and biopsy  01/25/2012    Procedure: CYSTOSCOPY/BIOPSY/HYDRODISTENSION;  Surgeon: Anner CreteJohn J Wrenn, MD;  Location: [facility];  Service: Urology;  Laterality: N/A;  Cystoscopy and Hydrodistension of the Bladder  . 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 Esophagogastroduodenoscopy (egd) with propofol N/A 02/13/2014    UJW:[id number]:Incomplete EGD as described above s/p bx (mild changes of reflux on esophageal bx  . Esophageal biopsy N/A 02/13/2014    Procedure: BIOPSY;  Surgeon: Corbin Adeobert M Rourk, MD;  Location: [facility];  Service: Endoscopy;  Laterality: N/A;  . Esophagogastroduodenoscopy (egd) with propofol N/A 04/10/2014    Dr. Jena Gaussourk: patulous EG junction, s/p 6056 F Maloney dilation empirically  . Esophageal dilation N/A 04/10/2014    Procedure: ESOPHAGEAL DILATION;  Surgeon: Corbin Adeobert M Rourk, MD;  Location: [facility];  Service: Endoscopy;  Laterality: N/A;  Malloney 54,     . Cardiac catheterization  10/15/14    FAMILY HISTORY: family history includes Cancer in his maternal aunt and maternal grandfather; GI problems in his brother and another family member; Heart attack in his brother; Heart disease in his maternal uncle; Hypertension in his father and mother; Stroke in his brother. There is no history of Colon cancer or Liver disease.  SOCIAL HISTORY:  reports that he quit smoking about 21 months ago. His smoking use included Cigarettes. He has a 35 pack-year smoking history. He has never used smokeless tobacco. He reports that he does not drink alcohol or use illicit drugs.  REVIEW OF SYSTEMS:  Other than that discussed above is noncontributory.  PHYSICAL EXAMINATION: ECOG PERFORMANCE STATUS: 1 - Symptomatic but completely ambulatory  Blood pressure 123/74, pulse 58, temperature 97.9 F (36.6 C), temperature source Oral, resp. rate 16, weight 214 lb 8 oz (97.297 kg), SpO2 100 %.  GENERAL:alert, no distress and comfortable SKIN: skin color, texture, turgor are normal, no rashes or significant lesions EYES: PERLA; Conjunctiva are pink and non-injected, sclera clear SINUSES: No redness or tenderness over maxillary or ethmoid sinuses OROPHARYNX:no exudate, no erythema on lips, buccal mucosa, or tongue. NECK:  supple, thyroid normal size, non-tender, without nodularity. No masses CHEST: Increased AP diameter with no breast masses. LYMPH:  no palpable lymphadenopathy in the cervical, axillary or inguinal LUNGS: clear to auscultation and percussion with normal breathing effort HEART: regular rate & rhythm and no murmurs. ABDOMEN:abdomen soft, non-tender and normal bowel sounds MUSCULOSKELETAL:no cyanosis of digits and no clubbing. Range of motion normal. Chronic lower extremity tenderness with swelling. Negative Homans sign. NEURO: alert & oriented x 3 with fluent speech, no focal motor/sensory deficits   LABORATORY DATA: Lab on 10/16/2014  Component Date  Value Ref Range Status  . WBC 10/16/2014 11.0* 4.0 - 10.5 K/uL Final  . RBC 10/16/2014 4.64  4.22 - 5.81 MIL/uL Final  . Hemoglobin 10/16/2014 13.0  13.0 - 17.0 g/dL Final  . HCT 16/10/960412/17/2015 40.0  39.0 - 52.0 % Final  . MCV 10/16/2014 86.2  78.0 - 100.0 fL Final  . MCH 10/16/2014 28.0  26.0 - 34.0 pg Final  . MCHC 10/16/2014 32.5  30.0 - 36.0 g/dL Final  . RDW 54/09/811912/17/2015 13.3  11.5 - 15.5 % Final  . Platelets 10/16/2014 356  150 - 400 K/uL Final  . Neutrophils Relative % 10/16/2014 47  43 - 77 % Final  . Neutro Abs 10/16/2014 5.2  1.7 - 7.7 K/uL Final  . Lymphocytes Relative 10/16/2014 44  12 - 46 % Final  . Lymphs Abs 10/16/2014 4.8* 0.7 - 4.0 K/uL Final  . Monocytes Relative 10/16/2014 5  3 - 12 % Final  . Monocytes Absolute 10/16/2014 0.6  0.1 - 1.0 K/uL Final  . Eosinophils Relative 10/16/2014 4  0 - 5 % Final  . Eosinophils Absolute 10/16/2014 0.4  0.0 - 0.7 K/uL Final  . Basophils Relative 10/16/2014 0  0 - 1 % Final  . Basophils Absolute 10/16/2014 0.0  0.0 - 0.1 K/uL Final  . D-Dimer, Quant 10/16/2014 0.38  0.00 - 0.48 ug/mL-FEU Final   Comment:        AT THE INHOUSE ESTABLISHED CUTOFF VALUE OF 0.48 ug/mL FEU, THIS ASSAY HAS BEEN DOCUMENTED IN THE LITERATURE TO HAVE A SENSITIVITY AND NEGATIVE PREDICTIVE VALUE OF AT LEAST 98 TO 99%.  THE TEST RESULT SHOULD BE CORRELATED WITH AN ASSESSMENT OF THE CLINICAL PROBABILITY OF DVT / VTE.   . Lipase 10/16/2014 11  11 - 59 U/L Final  Lab on 09/18/2014  Component Date Value Ref Range Status  . D-Dimer, Quant 09/18/2014 <0.27  0.00 - 0.48 ug/mL-FEU Final   Comment:        AT THE INHOUSE ESTABLISHED CUTOFF VALUE OF 0.48 ug/mL FEU, THIS ASSAY HAS BEEN DOCUMENTED IN THE LITERATURE TO HAVE A SENSITIVITY AND NEGATIVE PREDICTIVE VALUE OF AT LEAST 98 TO 99%.  THE TEST RESULT SHOULD BE CORRELATED WITH AN ASSESSMENT OF THE CLINICAL PROBABILITY OF DVT / VTE.   . WBC 09/18/2014 12.1* 4.0 - 10.5 K/uL Final  . RBC 09/18/2014 4.39  4.22  - 5.81 MIL/uL Final  . Hemoglobin 09/18/2014 12.5* 13.0 - 17.0 g/dL Final  . HCT 14/78/295611/19/2015 37.2* 39.0 - 52.0 % Final  . MCV 09/18/2014 84.7  78.0 - 100.0 fL Final  . MCH 09/18/2014 28.5  26.0 - 34.0 pg Final  . MCHC 09/18/2014 33.6  30.0 - 36.0 g/dL Final  . RDW 21/30/865711/19/2015 13.4  11.5 - 15.5 % Final  . Platelets 09/18/2014 368  150 - 400 K/uL Final  . Neutrophils Relative % 09/18/2014 60  43 - 77 % Final  . Neutro  Abs 09/18/2014 7.2  1.7 - 7.7 K/uL Final  . Lymphocytes Relative 09/18/2014 31  12 - 46 % Final  . Lymphs Abs 09/18/2014 3.8  0.7 - 4.0 K/uL Final  . Monocytes Relative 09/18/2014 7  3 - 12 % Final  . Monocytes Absolute 09/18/2014 0.8  0.1 - 1.0 K/uL Final  . Eosinophils Relative 09/18/2014 2  0 - 5 % Final  . Eosinophils Absolute 09/18/2014 0.3  0.0 - 0.7 K/uL Final  . Basophils Relative 09/18/2014 0  0 - 1 % Final  . Basophils Absolute 09/18/2014 0.0  0.0 - 0.1 K/uL Final  . Lipase 09/18/2014 11  11 - 59 U/L Final    PATHOLOGY: No new pathology.  Urinalysis    Component Value Date/Time   COLORURINE YELLOW 01/28/2014 1504   APPEARANCEUR CLEAR 01/28/2014 1504   LABSPEC >1.030* 01/28/2014 1504   PHURINE 5.5 01/28/2014 1504   GLUCOSEU NEGATIVE 01/28/2014 1504   HGBUR NEGATIVE 01/28/2014 1504   BILIRUBINUR NEGATIVE 01/28/2014 1504   KETONESUR NEGATIVE 01/28/2014 1504   PROTEINUR NEGATIVE 01/28/2014 1504   UROBILINOGEN 0.2 01/28/2014 1504   NITRITE NEGATIVE 01/28/2014 1504   LEUKOCYTESUR NEGATIVE 01/28/2014 1504    RADIOGRAPHIC STUDIES: No results found.  ASSESSMENT:  #1. Chronic abdominal pain from previous bouts of pancreatitis with bilateral postphlebitic lower extremity pain.  #2. Factor V Leiden mutation on lifelong Xarelto.  #3. Chronic obstructive pulmonary disease, no longer smoking.  #4. Chronic abdominal pain from previous pancreatitis episodes, controlled with methadone and oxycodone.  #5. Recurrent hematuria, currently resolved.  #6. Bilateral  lower extremity varicosities with ulceration, vascular surgery evaluation on 04/29/2014 without indication for any surgical procedure.  #7. Patulous EG junction, status post dilation, not much improvement in symptoms . #8. Postphlebitic syndrome both lower extremities   PLAN:  #1. Continue zolpidem, methadone, oxycodone, Nexium, pancreatic enzymes, Valium, and Xarelto. #2. Follow-up with Dr. Logan Bores at [facility] on 10/29/2014 as well as cardiology. #3. Follow-up in 4 weeks with CBC, d-dimer   All questions were answered. The patient knows to call the clinic with any problems, questions or concerns. We can certainly see the patient much sooner if necessary.   I spent 25 minutes counseling the patient face to face. The total time spent in the appointment was 30 minutes.    Maurilio Lovely, MD 10/16/2014 9:41 AM  DISCLAIMER:  This note was dictated with voice recognition software.  Similar sounding words can inadvertently be transcribed inaccurately and may not be corrected upon review.

## 2014-10-16 NOTE — Progress Notes (Signed)
LABS FOR DDIM,LIPASE,CBCD 

## 2014-10-16 NOTE — Patient Instructions (Signed)
[facility] Discharge Instructions  RECOMMENDATIONS MADE BY THE CONSULTANT AND ANY TEST RESULTS WILL BE SENT TO YOUR REFERRING PHYSICIAN.  EXAM FINDINGS BY THE PHYSICIAN TODAY AND SIGNS OR SYMPTOMS TO REPORT TO CLINIC OR PRIMARY PHYSICIAN: Exam and findings as discussed by Dr. Zigmund DanielFormanek.  No changes in therapy.  MEDICATIONS PRESCRIBED:  Refills for Methadone and Oxycodone  INSTRUCTIONS/FOLLOW-UP: 1 month - labs and office visit.  Thank you for choosing [facility] to provide your oncology and hematology care.  To afford each patient quality time with our providers, please arrive at least 15 minutes before your scheduled appointment time.  With your help, our goal is to use those 15 minutes to complete the necessary work-up to ensure our physicians have the information they need to help with your evaluation and healthcare recommendations.    Effective January 1st, 2014, we ask that you re-schedule your appointment with our physicians should you arrive 10 or more minutes late for your appointment.  We strive to give you quality time with our providers, and arriving late affects you and other patients whose appointments are after yours.    Again, thank you for choosing [facility].  Our hope is that these requests will decrease the amount of time that you wait before being seen by our physicians.       _____________________________________________________________  Should you have questions after your visit to [facility], please contact our office at [phone] between the hours of 8:30 a.m. and 4:30 p.m.  Voicemails left after 4:30 p.m. will not be returned until the following business day.  For prescription refill requests, have your pharmacy contact our office with your prescription refill request.    _______________________________________________________________  We hope that we have given you very good care.  You may receive a  patient satisfaction survey in the mail, please complete it and return it as soon as possible.  We value your feedback!  _______________________________________________________________  Have you asked about our STAR program?  STAR stands for Survivorship Training and Rehabilitation, and this is a nationally recognized cancer care program that focuses on survivorship and rehabilitation.  Cancer and cancer treatments may cause problems, such as, pain, making you feel tired and keeping you from doing the things that you need or want to do. Cancer rehabilitation can help. Our goal is to reduce these troubling effects and help you have the best quality of life possible.  You may receive a survey from a nurse that asks questions about your current state of health.  Based on the survey results, all eligible patients will be referred to the [facility] program for an evaluation so we can better serve you!  A frequently asked questions sheet is available upon request.

## 2014-10-17 ENCOUNTER — Other Ambulatory Visit (HOSPITAL_COMMUNITY): Payer: Self-pay | Admitting: Hematology and Oncology

## 2014-10-17 ENCOUNTER — Encounter: Payer: Self-pay | Admitting: Gastroenterology

## 2014-10-17 ENCOUNTER — Ambulatory Visit (INDEPENDENT_AMBULATORY_CARE_PROVIDER_SITE_OTHER): Payer: Medicare Other | Admitting: Gastroenterology

## 2014-10-17 ENCOUNTER — Other Ambulatory Visit (HOSPITAL_COMMUNITY): Payer: Self-pay | Admitting: Oncology

## 2014-10-17 VITALS — BP 142/82 | HR 76 | Temp 98.6°F | Ht 68.0 in | Wt 218.6 lb

## 2014-10-17 DIAGNOSIS — R1310 Dysphagia, unspecified: Secondary | ICD-10-CM | POA: Diagnosis not present

## 2014-10-17 DIAGNOSIS — G4700 Insomnia, unspecified: Secondary | ICD-10-CM

## 2014-10-17 DIAGNOSIS — G8929 Other chronic pain: Secondary | ICD-10-CM

## 2014-10-17 DIAGNOSIS — R109 Unspecified abdominal pain: Secondary | ICD-10-CM | POA: Diagnosis not present

## 2014-10-17 MED ORDER — PROCHLORPERAZINE MALEATE 10 MG PO TABS: 5.0000 mg | ORAL_TABLET | Freq: Three times a day (TID) | ORAL | Status: DC

## 2014-10-17 MED ORDER — ZOLPIDEM TARTRATE 5 MG PO TABS: 5.0000 mg | ORAL_TABLET | Freq: Every evening | ORAL | Status: DC | PRN

## 2014-10-17 NOTE — Progress Notes (Signed)
Referring Provider: Farrel Gobble, MD Primary GI: Dr. Gala Romney   Chief Complaint  Patient presents with  . Dysphagia  . Nausea  . Emesis    HPI:   Alex Hoffman presents today in follow-up with history of chronic pancreatitis, GERD, fatty liver, constipation, chronic abdominal pain. Recent EGD/ED in June 2015 with empiric dilation. BPE July 2015 with focal narrowing of GE junction, obstruction of a 12.5 mm tablet. Attempted to have patient return for a visit to discuss repeat EGD/ED; however, he has had multiple health issues in the interim.    Seen at [facility] Oct 2015 for second opinion secondary to chronic abdominal pain. He was to have manometry performed due to dysphagia; this is scheduled for 12/30. States dysphagia with "everything". Even liquids feel like it gets hung up.  Had a stress test due to chest discomfort and SOB. Unable to complete appropriately so cardiac cath completed at [facility] exhausted now. Feels like he gets out of breath easily. Notes lower, suprapubic abdominal discomfort and right-sided abdominal pain at baseline. Compazine for nausea, works the best. Vomiting if not taking Compazine "religiously".    Past Medical History  Diagnosis Date  . Pulmonary embolism 12/2010  . Cystic thyroid nodule     left  . HTN (hypertension)   . Heterozygous factor V Leiden mutation 12/2010  . Pancreatitis 05/2011    EUS Dr Ardis Hughs 06/09/11-> dilated main pancreatic duct in HOP, 2-3 filling defects distal pancreatic duct, peripancreatic fluid resolved  . C. difficile colitis 01/2011?  Marland Kitchen Arthritis   . Clotting disorder   . GERD (gastroesophageal reflux disease)   . Chills   . Fever   . Weight loss   . Hearing loss   . Nasal congestion   . Leg swelling   . Palpitations   . Abdominal distension   . Abdominal pain   . Nausea & vomiting   . Rectal pain   . Difficulty urinating   . Arthritis pain   . Headache(784.0)   . Weakness   . Bruises easily   . Anemia   .  Dizziness   . Numbness and tingling     both hands, and both legs and feet  . Joint pain   . Blood in urine     hx of, none current  . Frequent urination at night   . Pneumonia as child  . Abnormal EKG     hx left bundle branch block on ekg  . Sleep apnea     STOPBANG=5  . Heterozygous factor V Leiden mutation   . Prostatitis, chronic   . Pancreatitis   . Pancreatitis 12/2011  . Arthritis     lower back. Dr. Effie Berkshire in [place]  . GERD (gastroesophageal reflux disease)   . HTN (hypertension) 01/28/2014  . BPH (benign prostatic hyperplasia) 01/28/2014  . Factor V Leiden 07/19/2013    Past Surgical History  Procedure Laterality Date  . Neck surgery  1999    C5/C6 fusion, limited neck mobility especially turning to left  . Knee surgery  1980's    arthroscopy, left knee  . Femur fracture surgery  age 79    right leg  . Nerve surgery  1980's    right thumb  . Colonoscopy  01/2011    [facility]. Annitta Jersey at [facility]-->normal  . Esophagogastroduodenoscopy  01/2011    Dr. Annitta Jersey at [facility]-->bile reflux in stomach-->patient c/o inadequate conscious sedation (Fentanyl 175m/Versed 864m  . Ercp  07/2011  Dr. Amalia Hailey [facility]-> pancreatic enterotomy, pancreatic duct stones removed, bile duct not manipulated, no stents  . Cholecystectomy  01/25/2012    Procedure: LAPAROSCOPIC CHOLECYSTECTOMY WITH INTRAOPERATIVE CHOLANGIOGRAM;  Surgeon: Merrie Roof, MD;  Location: [facility];  Service: General;  Laterality: N/A;  . Cystoscopy with hydrodistension and biopsy  01/25/2012    Procedure: CYSTOSCOPY/BIOPSY/HYDRODISTENSION;  Surgeon: Malka So, MD;  Location: [facility];  Service: Urology;  Laterality: N/A;  Cystoscopy and Hydrodistension of the Bladder  . Colonoscopy with propofol  11/22/2012    IWO:[id number] anal rectum/fissure not identified although he certainly could have a chronic anal fissure   . Esophagogastroduodenoscopy (egd) with propofol N/A 02/13/2014    [id number] EGD as described above s/p  bx (mild changes of reflux on esophageal bx  . Esophageal biopsy N/A 02/13/2014    Procedure: BIOPSY;  Surgeon: Daneil Dolin, MD;  Location: [facility];  Service: Endoscopy;  Laterality: N/A;  . Esophagogastroduodenoscopy (egd) with propofol N/A 04/10/2014    Dr. Gala Romney: patulous EG junction, s/p 2 F Maloney dilation empirically  . Esophageal dilation N/A 04/10/2014    Procedure: ESOPHAGEAL DILATION;  Surgeon: Daneil Dolin, MD;  Location: [facility];  Service: Endoscopy;  Laterality: N/A;  Malloney 21,   . Cardiac catheterization  10/15/14    Current Outpatient Prescriptions  Medication Sig Dispense Refill  . Bisacodyl (DULCOLAX PO) Take 1 tablet by mouth as needed.    . carvedilol (COREG) 25 MG tablet Take 25 mg by mouth 2 (two) times daily.    . diazepam (VALIUM) 5 MG tablet Take 1 tablet (5 mg total) by mouth every 6 (six) hours as needed for anxiety. 40 tablet 2  . esomeprazole (NEXIUM) 40 MG capsule Take 1 capsule (40 mg total) by mouth 2 (two) times daily. For indigestion 60 capsule 11  . ibuprofen (ADVIL,MOTRIN) 200 MG tablet Take 400 mg by mouth 2 (two) times daily as needed for moderate pain.    . Lidocaine-Hydrocortisone Ace 3-0.5 % KIT Place 1 application rectally 2 (two) times daily. 30 each 0  . lisinopril (ZESTRIL) 10 MG tablet Take 10 mg by mouth daily.    . methadone (DOLOPHINE) 10 MG tablet Take 5 tablets (50 mg total) by mouth every 8 (eight) hours. 450 tablet 0  . oxycodone (ROXICODONE) 30 MG immediate release tablet Take 1 tablet every 4-6 hours as needed for pain 120 tablet 0  . Pancrelipase, Lip-Prot-Amyl, (ZENPEP) 40000 UNITS CPEP 3 capsules with meals and 2 with snacks. 400 capsule 5  . phenazopyridine (PYRIDIUM) 100 MG tablet Take 100 mg by mouth 2 (two) times daily as needed for pain.     Marland Kitchen prochlorperazine (COMPAZINE) 10 MG tablet Take 0.5-1 tablets (5-10 mg total) by mouth 3 (three) times daily. 60 tablet 3  . rivaroxaban (XARELTO) 20 MG TABS tablet Take 1 tablet (20 mg  total) by mouth daily with supper. 30 tablet 12  . silodosin (RAPAFLO) 8 MG CAPS capsule Take 1 capsule (8 mg total) by mouth daily with breakfast. 30 capsule 3  . tiZANidine (ZANAFLEX) 4 MG capsule Take 1 capsule up to 4 times a day for spasms. 120 capsule 3  . zolpidem (AMBIEN) 5 MG tablet Take 1 tablet (5 mg total) by mouth at bedtime as needed for sleep. 30 tablet 0   No current facility-administered medications for this visit.    Allergies as of 10/17/2014 - Review Complete 10/17/2014  Allergen Reaction Noted  . Reglan [metoclopramide]  08/09/2013  Family History  Problem Relation Age of Onset  . GI problems Brother   . Colon cancer Neg Hx   . Liver disease Neg Hx   . GI problems      stomach and lung cancer, aunts/uncles  . Heart attack Brother     before age of 4  . Stroke Brother   . Hypertension Mother   . Hypertension Father   . Cancer Maternal Aunt     colon/pancreas/lung/stomach  . Heart disease Maternal Uncle   . Cancer Maternal Grandfather     lung    History   Social History  . Marital Status: Married    Spouse Name: N/A    Number of Children: 2  . Years of Education: N/A   Occupational History  .      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 Smoking status: Former Smoker -- 1.00 packs/day for 35 years    Types: Cigarettes    Quit date: 12/21/2012  . Smokeless tobacco: Never Used     Comment: About 2 cigarettes per day.   . Alcohol Use: No     Comment: No alcohol since January 27, 2011  . Drug Use: No  . Sexual Activity: None   Other Topics Concern  . None   Social History Narrative    Review of Systems: As mentioned in HPI.   Physical Exam: BP 142/82 mmHg  Pulse 76  Temp(Src) 98.6 F (37 C) (Oral)  Ht _0  (1.727 m)  Wt 218 lb 9.6 oz (99.156 kg)  BMI 33.25 kg/m2 General:   Alert and oriented. No distress noted. Pleasant and cooperative.  Head:   Normocephalic and atraumatic. Eyes:  Conjuctiva clear without scleral icterus. Mouth:  Oral mucosa pink and moist. Good dentition. No lesions. Abdomen:  +BS, soft, diffuse TTP with only mild palpation, at baseline. Limited exam as patient sitting up in chair, stating he is unable to lay down today Msk:  Symmetrical without gross deformities. Normal posture. Extremities:  Without edema. Neurologic:  Alert and  oriented x4;  grossly normal neurologically. Psych:  Alert and cooperative. Normal mood and affect.

## 2014-10-17 NOTE — Patient Instructions (Signed)
[facility].   We will see you back in 6 months.   Get some rest and have  Merry Christmas!

## 2014-10-17 NOTE — Assessment & Plan Note (Signed)
Empiric dilation earlier this year, with subsequent BPE noting focal narrowing at GE junction. [facility] at [facility] in the [facility], with plans for manometry on 12/30. May ultimately need repeat dilation but will follow-up on [facility] recommendations as they come available.

## 2014-10-17 NOTE — Assessment & Plan Note (Signed)
With extensive prior work-up through our office and now established at [facility]. Agree with manometry due to persistent upper GI symptoms. May need repeat EGD/ED; will follow-up on what [facility] recommends after 12/30. Continue Nexium BID, pancreatic enzymes. 6 month return.

## 2014-10-29 NOTE — Progress Notes (Signed)
cc'ed to pcp °

## 2014-11-14 ENCOUNTER — Other Ambulatory Visit (HOSPITAL_COMMUNITY): Payer: Self-pay

## 2014-11-14 DIAGNOSIS — D6851 Activated protein C resistance: Secondary | ICD-10-CM

## 2014-11-15 NOTE — Assessment & Plan Note (Addendum)
On Xarelto 20 mg daily.  Compliance encouraged. Return in 2 months for follow-up   

## 2014-11-15 NOTE — Assessment & Plan Note (Addendum)
Not a hematologic issue.  On high dose narcotics for pain controlled, enabled by Dr. Zigmund DanielFormanek.  Will provide a 2 month supply. Pain contract signed.

## 2014-11-15 NOTE — Progress Notes (Signed)
No primary care provider on file. No primary provider on file.  Heterozygous factor V Leiden mutation - Plan: CBC with Differential, Comprehensive metabolic panel  Chronic abdominal pain - Plan: Lipase  Localized swelling of both lower legs - Plan: methadone (DOLOPHINE) 10 MG tablet, oxycodone (ROXICODONE) 30 MG immediate release tablet, DISCONTINUED: methadone (DOLOPHINE) 10 MG tablet, DISCONTINUED: oxycodone (ROXICODONE) 30 MG immediate release tablet  Insomnia - Plan: diazepam (VALIUM) 5 MG tablet, zolpidem (AMBIEN) 5 MG tablet  Narcotic pain contract exists with [facility]  CURRENT THERAPY: Xarelto anticoagulation without any evidence of VTE recurrence.  INTERVAL HISTORY: Alex PernaDavid L Hoffman 54 y.o. male returns for followup of Factor V Leiden heterozygosity on hypercoag panel on 07/22/2013 without any other abnormalities.   Chart is reviewed in detail.  Of note, when Dr. Zigmund DanielFormanek (Hem/Onc Locum) consumed [name]'s care after he was released from [facility] [place] from the care of Dr. Margo CommonKen Karb for issues related to his pain medication, he consumed the patient's analgesia needs.  Today, I have provided the patient with a 2 month supply of his pain medications.  His pain is unrelated to hematology with chronic abdominal pain from pancreatitis, but we know from literature, that chronic pain is poorly controlled from pancreatitis.    Additionally, Dr. Zigmund DanielFormanek will be removed as the patient's [facility] provider [facility] as we do not perform primary care at the [facility].  Today, the patient signed a pain contrast with us to continue his pain medication.    He is encouraged to continue Xarelto and compliance is strongly recommended.   Hematologically, he denies any complaints and ROS questioning is negative.   Past Medical History  Diagnosis Date  . Pulmonary embolism 12/2010  . Cystic thyroid nodule     left  . HTN  (hypertension)   . Heterozygous factor V Leiden mutation 12/2010  . Pancreatitis 05/2011    EUS Dr Christella HartiganJacobs 06/09/11-> dilated main pancreatic duct in HOP, 2-3 filling defects distal pancreatic duct, peripancreatic fluid resolved  . C. difficile colitis 01/2011?  Marland Kitchen. Arthritis   . Clotting disorder   . GERD (gastroesophageal reflux disease)   . Chills   . Fever   . Weight loss   . Hearing loss   . Nasal congestion   . Leg swelling   . Palpitations   . Abdominal distension   . Abdominal pain   . Nausea & vomiting   . Rectal pain   . Difficulty urinating   . Arthritis pain   . Headache(784.0)   . Weakness   . Bruises easily   . Anemia   . Dizziness   . Numbness and tingling     both hands, and both legs and feet  . Joint pain   . Blood in urine     hx of, none current  . Frequent urination at night   . Pneumonia as child  . Abnormal EKG     hx left bundle branch block on ekg  . Sleep apnea     STOPBANG=5  . Heterozygous factor V Leiden mutation   . Prostatitis, chronic   . Pancreatitis   . Pancreatitis 12/2011  . Arthritis     lower back. Dr. Charlynne CousinsAlabanza in [place]  . GERD (gastroesophageal reflux disease)   . HTN (hypertension) 01/28/2014  . BPH (benign prostatic hyperplasia) 01/28/2014  . Factor V Leiden 07/19/2013  . 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     is allergic to reglan.  Mr. Pitre had no medications administered during this visit.  Past  Surgical History  Procedure Laterality Date  . Neck surgery  1999    C5/C6 fusion, limited neck mobility especially turning to left  . Knee surgery  1980's    arthroscopy, left knee  . Femur fracture surgery  age 34    right leg  . Nerve surgery  1980's    right thumb  . Colonoscopy  01/2011    Dr. Zenon Mayo at [facility]-->normal  . Esophagogastroduodenoscopy  01/2011    Dr. Zenon Mayo at [facility]-->bile reflux in stomach-->patient c/o inadequate conscious sedation (Fentanyl /Versed )  . Ercp  07/2011    Dr. Logan Bores [facility]-> pancreatic enterotomy, pancreatic duct stones removed, bile duct not manipulated, no stents  . Cholecystectomy  01/25/2012    Procedure: LAPAROSCOPIC CHOLECYSTECTOMY WITH INTRAOPERATIVE CHOLANGIOGRAM;  Surgeon: Robyne Askew, MD;  Location: [facility];  Service: General;  Laterality: N/A;  . Cystoscopy with hydrodistension and biopsy  01/25/2012    Procedure: CYSTOSCOPY/BIOPSY/HYDRODISTENSION;  Surgeon: Anner Crete, MD;  Location: [facility];  Service: Urology;  Laterality: N/A;  Cystoscopy and Hydrodistension of the Bladder  . 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 Esophagogastroduodenoscopy (egd) with propofol N/A 02/13/2014    [id number] EGD as described above s/p bx (mild changes of reflux on esophageal bx  . Esophageal biopsy N/A 02/13/2014    Procedure: BIOPSY;  Surgeon: Corbin Ade, MD;  Location: [facility];  Service: Endoscopy;  Laterality: N/A;  . Esophagogastroduodenoscopy (egd) with propofol N/A 04/10/2014    Dr. Jena Gauss: patulous EG junction, s/p 54 F Maloney dilation empirically  . Esophageal dilation N/A 04/10/2014    Procedure: ESOPHAGEAL DILATION;  Surgeon: Corbin Ade, MD;  Location: [facility];  Service: Endoscopy;  Laterality: N/A;  Malloney 56,   . Cardiac catheterization  10/15/14    Denies any headaches, dizziness, double vision, fevers, chills,  night sweats, nausea, vomiting, diarrhea, constipation, chest pain, heart palpitations, shortness of breath, blood in stool, black tarry stool, urinary pain, urinary burning, urinary frequency, hematuria.   PHYSICAL EXAMINATION  ECOG PERFORMANCE STATUS: 1 - Symptomatic but completely ambulatory  Filed Vitals:   11/17/14 1000  BP: 154/74  Pulse: 75  Temp: 97.8 F (36.6 C)  Resp: 20    GENERAL:alert, no distress, well nourished, well developed, comfortable, cooperative and smiling SKIN: skin color, texture, turgor are normal, no rashes or significant lesions HEAD: Normocephalic, No masses, lesions, tenderness or abnormalities EYES: normal, PERRLA, EOMI, Conjunctiva are pink and non-injected EARS: External ears normal OROPHARYNX:lips, buccal mucosa, and tongue normal and mucous membranes are moist  NECK: supple, no stridor, trachea midline LYMPH:  no palpable lymphadenopathy BREAST:not examined LUNGS: not examined HEART: not examined ABDOMEN:abdomen soft BACK: Back symmetric, no curvature. EXTREMITIES:less then 2 second capillary refill, no joint deformities, effusion, or inflammation, no skin discoloration, no cyanosis  NEURO:  alert & oriented x 3 with fluent speech, no focal motor/sensory deficits   LABORATORY DATA: CBC    Component Value Date/Time   WBC 9.6 11/17/2014 1048   RBC 4.11* 11/17/2014 1048   HGB 11.7* 11/17/2014 1048   HCT 34.8* 11/17/2014 1048   HCT 42 11/08/2011 1452   PLT 336 11/17/2014 1048   MCV 84.7 11/17/2014 1048   MCV 85.8 11/08/2011 1452   MCH 28.5 11/17/2014 1048   MCHC 33.6 11/17/2014 1048   RDW 13.0 11/17/2014 1048   LYMPHSABS 4.2* 11/17/2014 1048   MONOABS 0.5 11/17/2014 1048   EOSABS 0.3 11/17/2014 1048   BASOSABS 0.0 11/17/2014 1048      Chemistry      Component Value Date/Time   NA 136* 04/08/2014 1305   NA 137 11/08/2011 1450   K 4.2 04/08/2014 1305   K 3.9 11/08/2011 1450   CL 97 04/08/2014 1305   CL 103 08/29/2011 1618   CO2  27 04/08/2014 1305   CO2 32 08/29/2011 1618   BUN 14 04/08/2014 1305   BUN 8 11/08/2011 1450   CREATININE 0.95 04/08/2014 1305   CREATININE 0.78 05/02/2013 1134      Component Value Date/Time   CALCIUM 9.2 04/08/2014 1305   CALCIUM 9.3 11/08/2011 1450   ALKPHOS 105 01/13/2014 1017   ALKPHOS 89 11/08/2011 1450   AST 22 01/13/2014 1017   AST 21 11/08/2011 1450   ALT 25 01/13/2014 1017   BILITOT 1.0 01/13/2014 1017   BILITOT 0.6 08/29/2011 1618       ASSESSMENT AND PLAN:  Heterozygous factor V Leiden mutation On Xarelto 20 mg daily.  Compliance encouraged. Return in 2 months for follow-up   Chronic abdominal pain Not a hematologic issue.  On high dose narcotics for pain controlled, enabled by Dr. Zigmund Daniel.  Will provide a 2 month supply. Pain contract signed.     Narcotic pain contract exists with [facility] Signed today, 11/17/2014    THERAPY PLAN:  No signs or symptoms of VTE.  Continue Xarelto 20 mg daily.  Compliance encouraged.  All questions were answered. The patient knows to call the clinic with any problems, questions or concerns. We can certainly see the patient much sooner if necessary.  Patient and plan discussed with Dr. Loma Messing and she is in agreement with the aforementioned.   KEFALAS,THOMAS 11/17/2014

## 2014-11-17 ENCOUNTER — Encounter (HOSPITAL_COMMUNITY): Payer: Medicare (Managed Care) | Attending: Hematology and Oncology | Admitting: Oncology

## 2014-11-17 ENCOUNTER — Encounter (HOSPITAL_COMMUNITY): Payer: Self-pay | Admitting: Oncology

## 2014-11-17 ENCOUNTER — Encounter (HOSPITAL_BASED_OUTPATIENT_CLINIC_OR_DEPARTMENT_OTHER): Payer: Medicare (Managed Care)

## 2014-11-17 VITALS — BP 154/74 | HR 75 | Temp 97.8°F | Resp 20 | Wt 219.0 lb

## 2014-11-17 DIAGNOSIS — R2243 Localized swelling, mass and lump, lower limb, bilateral: Secondary | ICD-10-CM | POA: Diagnosis present

## 2014-11-17 DIAGNOSIS — D6859 Other primary thrombophilia: Secondary | ICD-10-CM | POA: Insufficient documentation

## 2014-11-17 DIAGNOSIS — Z79899 Other long term (current) drug therapy: Secondary | ICD-10-CM

## 2014-11-17 DIAGNOSIS — G8929 Other chronic pain: Secondary | ICD-10-CM

## 2014-11-17 DIAGNOSIS — I87009 Postthrombotic syndrome without complications of unspecified extremity: Secondary | ICD-10-CM | POA: Insufficient documentation

## 2014-11-17 DIAGNOSIS — D688 Other specified coagulation defects: Secondary | ICD-10-CM

## 2014-11-17 DIAGNOSIS — R109 Unspecified abdominal pain: Secondary | ICD-10-CM

## 2014-11-17 DIAGNOSIS — G4700 Insomnia, unspecified: Secondary | ICD-10-CM | POA: Insufficient documentation

## 2014-11-17 DIAGNOSIS — D6851 Activated protein C resistance: Secondary | ICD-10-CM

## 2014-11-17 HISTORY — DX: Other long term (current) drug therapy: Z79.899

## 2014-11-17 LAB — CBC WITH DIFFERENTIAL/PLATELET
BASOS PCT: 0 % (ref 0–1)
Basophils Absolute: 0 10*3/uL (ref 0.0–0.1)
EOS ABS: 0.3 10*3/uL (ref 0.0–0.7)
EOS PCT: 3 % (ref 0–5)
HCT: 34.8 % — ABNORMAL LOW (ref 39.0–52.0)
Hemoglobin: 11.7 g/dL — ABNORMAL LOW (ref 13.0–17.0)
LYMPHS PCT: 43 % (ref 12–46)
Lymphs Abs: 4.2 10*3/uL — ABNORMAL HIGH (ref 0.7–4.0)
MCH: 28.5 pg (ref 26.0–34.0)
MCHC: 33.6 g/dL (ref 30.0–36.0)
MCV: 84.7 fL (ref 78.0–100.0)
Monocytes Absolute: 0.5 10*3/uL (ref 0.1–1.0)
Monocytes Relative: 6 % (ref 3–12)
Neutro Abs: 4.6 10*3/uL (ref 1.7–7.7)
Neutrophils Relative %: 48 % (ref 43–77)
PLATELETS: 336 10*3/uL (ref 150–400)
RBC: 4.11 MIL/uL — AB (ref 4.22–5.81)
RDW: 13 % (ref 11.5–15.5)
WBC: 9.6 10*3/uL (ref 4.0–10.5)

## 2014-11-17 LAB — D-DIMER, QUANTITATIVE (NOT AT ARMC): D DIMER QUANT: 0.29 ug{FEU}/mL (ref 0.00–0.48)

## 2014-11-17 LAB — LIPASE, BLOOD: LIPASE: 15 U/L (ref 11–59)

## 2014-11-17 MED ORDER — DIAZEPAM 5 MG PO TABS
5.0000 mg | ORAL_TABLET | Freq: Four times a day (QID) | ORAL | Status: DC | PRN
Start: 2014-11-17 — End: 2015-01-05

## 2014-11-17 MED ORDER — OXYCODONE HCL 30 MG PO TABS: ORAL_TABLET | ORAL | Status: DC

## 2014-11-17 MED ORDER — METHADONE HCL 10 MG PO TABS: 50.0000 mg | ORAL_TABLET | Freq: Three times a day (TID) | ORAL | Status: DC

## 2014-11-17 MED ORDER — ZOLPIDEM TARTRATE 5 MG PO TABS: 5.0000 mg | ORAL_TABLET | Freq: Every evening | ORAL | Status: DC | PRN

## 2014-11-17 NOTE — Patient Instructions (Signed)
[facility]  Discharge Instructions:  [facility].   Rx's today:  1. Methadone  2. Roxicodone  3. Ambien with 1 refill  4. Valium with 1 refill I have also given you 2 additional prescriptions for Methadone and Roxicodone that are to be filled in 4 weeks. Today, you signed a pain contract.  This is updated in your chart. Return in 2 months for follow-up. _______________________________________________________________  Thank you for choosing [facility] to provide your oncology and hematology care.  To afford each patient quality time with our providers, please arrive at least 15 minutes before your scheduled appointment.  You need to re-schedule your appointment if you arrive 10 or more minutes late.  We strive to give you quality time with our providers, and arriving late affects you and other patients whose appointments are after yours.  Also, if you no show three or more times for appointments you may be dismissed from the clinic.  Again, thank you for choosing [facility] at [facility]. Our hope is that these requests will allow you access to exceptional care and in a timely manner. _______________________________________________________________  If you have questions after your visit, please contact our office at [phone] between the hours of 8:30 a.m. and 5:00 p.m. Voicemails left after 4:30 p.m. will not be returned until the following business day. _______________________________________________________________  For prescription refill requests, have your pharmacy contact our office. _______________________________________________________________  Recommendations made by the consultant and any test results will be sent to your referring physician. _______________________________________________________________

## 2014-11-17 NOTE — Assessment & Plan Note (Signed)
Signed today, 11/17/2014

## 2014-11-17 NOTE — Progress Notes (Signed)
LABS FOR LIPASE,DDIM,CBCD 

## 2015-01-05 ENCOUNTER — Ambulatory Visit (HOSPITAL_COMMUNITY): Payer: Medicare Other | Admitting: Oncology

## 2015-01-05 ENCOUNTER — Other Ambulatory Visit (HOSPITAL_COMMUNITY): Payer: Medicare Other

## 2015-01-05 MED ORDER — METHADONE HCL 10 MG PO TABS: 50.0000 mg | ORAL_TABLET | Freq: Three times a day (TID) | ORAL | Status: DC

## 2015-01-05 MED ORDER — ZOLPIDEM TARTRATE 5 MG PO TABS: 5.0000 mg | ORAL_TABLET | Freq: Every evening | ORAL | Status: DC | PRN

## 2015-01-05 MED ORDER — OXYCODONE HCL 30 MG PO TABS: ORAL_TABLET | ORAL | Status: DC

## 2015-01-05 MED ORDER — DIAZEPAM 5 MG PO TABS: 5.0000 mg | ORAL_TABLET | Freq: Four times a day (QID) | ORAL | Status: DC | PRN

## 2015-01-05 NOTE — Assessment & Plan Note (Addendum)
Secondary to recurrent pancreatitis.  Not a hematologic issue.  On high dose narcotics for pain controlled, enabled by Dr. Formanek.  Will provide a 2 month supply of Methadone and Oxycodone. Pain contract signed.   

## 2015-01-05 NOTE — Assessment & Plan Note (Signed)
Signed on 11/17/2014.   

## 2015-01-05 NOTE — Progress Notes (Signed)
-  Rescheduled for 3/14 at patient's request-

## 2015-01-05 NOTE — Progress Notes (Signed)
No primary care provider on file. No primary provider on file.  Heterozygous factor V Leiden mutation  Narcotic pain contract exists with [facility]  Chronic abdominal pain  CURRENT THERAPY: Xarelto anticoagulation without any evidence of VTE recurrence.  Started by Dr. Zigmund Daniel.  INTERVAL HISTORY: Alex Hoffman 54 y.o. male returns for followup of Factor V Leiden heterozygosity on hypercoag panel on 07/22/2013 without any other abnormalities.  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  Chart is reviewed in detail. Of note, when Dr. Zigmund Daniel (Hem/Onc Locum) consumed Mr. Caidence care after he was released from [facility] in [place] from the care of Dr. Margo Common for issues related to his pain medication, he consumed the patient's analgesia needs.   Today, I have provided the patient with a 2 month supply of his pain medications. His pain is unrelated to hematology with chronic abdominal pain from pancreatitis, but we know from literature, that chronic pain is historically poorly controlled from pancreatitis.   I personally reviewed and went over laboratory results with the patient.  The results are noted within this dictation.  He reports that he has an upcoming appointment with his cardiologist at [facility]  His legs are swollen B/L and tender to palpation.  He has 2-3+ pitting edema pre-tibially.  No erythema.  No heat.  He reports elevation of his feet help with pain.   Hematologically, he denies any complaints and ROS questioning is negative.  Past Medical History  Diagnosis Date  . Pulmonary embolism 12/2010  . Cystic thyroid nodule     left  . HTN (hypertension)   . Heterozygous factor V Leiden mutation 12/2010  . Pancreatitis 05/2011    EUS Dr Christella Hartigan 06/09/11-> dilated main pancreatic duct in HOP,  2-3 filling defects distal pancreatic duct, peripancreatic fluid resolved  . C. difficile colitis 01/2011?  Marland Kitchen Arthritis   . Clotting disorder   . GERD (gastroesophageal reflux disease)   . Chills   . Fever   . Weight loss   . Hearing loss   . Nasal congestion   . Leg swelling   . Palpitations   . Abdominal distension   . Abdominal pain   . Nausea & vomiting   . Rectal pain   . Difficulty urinating   . Arthritis pain   . Headache(784.0)   . Weakness   . Bruises easily   . Anemia   . Dizziness   . Numbness and tingling     both hands, and both legs and feet  . Joint pain   . Blood in urine     hx of, none current  . Frequent urination at night   . Pneumonia as child  . Abnormal EKG     hx left bundle branch block on ekg  . Sleep apnea     STOPBANG=5  . Heterozygous factor V Leiden mutation   . Prostatitis, chronic   . Pancreatitis   . Pancreatitis 12/2011  . Arthritis     lower back. Dr. Charlynne Cousins in [place]  . GERD (gastroesophageal reflux disease)   . HTN (hypertension) 01/28/2014  . BPH (benign prostatic hyperplasia) 01/28/2014  . Factor V Leiden 07/19/2013  . 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     is allergic to reglan.  Mr. Viernes had no medications administered during this visit.  Past Surgical History  Procedure Laterality Date  . Neck surgery  1999    C5/C6 fusion, limited neck mobility especially turning to left  . Knee surgery   1980's    arthroscopy, left knee  . Femur fracture surgery  age 84    right leg  . Nerve surgery  1980's    right thumb  . Colonoscopy  01/2011    Dr. Zenon Mayo at [facility]-->normal  . Esophagogastroduodenoscopy  01/2011    Dr. Zenon Mayo at [facility]-->bile reflux in stomach-->patient c/o inadequate conscious sedation (Fentanyl /Versed )  . Ercp  07/2011    Dr. Logan Bores [facility]-> pancreatic enterotomy, pancreatic duct stones removed, bile duct not manipulated, no stents  . Cholecystectomy  01/25/2012    Procedure: LAPAROSCOPIC CHOLECYSTECTOMY WITH INTRAOPERATIVE CHOLANGIOGRAM;  Surgeon: Robyne Askew, MD;  Location: [facility];  Service: General;  Laterality: N/A;  . Cystoscopy with hydrodistension and biopsy  01/25/2012    Procedure: CYSTOSCOPY/BIOPSY/HYDRODISTENSION;  Surgeon: Anner Crete, MD;  Location: [facility];  Service: Urology;  Laterality: N/A;  Cystoscopy and Hydrodistension of the Bladder  . Colonoscopy with propofol  11/22/2012    BJY:[id number] anal rectum/fissure not identified although he certainly could have a chronic anal fissure   . Esophagogastroduodenoscopy (egd) with propofol N/A 02/13/2014    [id number]:[id number] EGD as described above s/p bx (mild changes of reflux on esophageal bx  . Esophageal biopsy N/A 02/13/2014    Procedure: BIOPSY;  Surgeon: Corbin Ade, MD;  Location: [facility];  Service: Endoscopy;  Laterality: N/A;  . Esophagogastroduodenoscopy (egd) with propofol N/A 04/10/2014    Dr. Jena Gauss: patulous EG junction, s/p 9 F Maloney dilation empirically  . Esophageal dilation N/A 04/10/2014    Procedure: ESOPHAGEAL DILATION;  Surgeon: Corbin Ade, MD;  Location: [facility];  Service: Endoscopy;  Laterality: N/A;  Malloney 54,   . Cardiac catheterization  10/15/14    Denies any headaches, dizziness, double vision, fevers, chills, night sweats, nausea, vomiting, diarrhea, constipation, chest pain, heart palpitations, shortness of breath, blood in stool, black tarry stool, urinary  pain, urinary burning, urinary frequency, hematuria.   PHYSICAL EXAMINATION  ECOG PERFORMANCE STATUS: 1 - Symptomatic but completely ambulatory  Filed Vitals:   01/12/15 1147  BP: 166/88  Pulse: 77  Temp: 97.9 F (36.6 C)  Resp: 20    GENERAL:alert, no distress, well nourished, well developed, comfortable, cooperative, obese and smiling SKIN: skin color, texture, turgor are normal, no rashes or significant lesions HEAD: Normocephalic, No masses, lesions, tenderness or abnormalities EYES: normal, PERRLA, EOMI, Conjunctiva are pink and non-injected EARS: External ears normal OROPHARYNX:lips, buccal mucosa, and tongue normal and mucous membranes are moist  NECK: supple, thyroid normal size, non-tender, without nodularity, no stridor, non-tender, trachea midline LYMPH:  no palpable lymphadenopathy BREAST:not examined LUNGS: clear to auscultation  HEART: regular rate & rhythm, no murmurs and no gallops ABDOMEN:abdomen soft and normal bowel sounds BACK: Back symmetric, no curvature., No CVA tenderness EXTREMITIES:less then 2 second capillary refill, no joint deformities, effusion, or inflammation, no  skin discoloration, no clubbing, no cyanosis, positive findings:  edema bilateral LE edema 2-3+ pitting with tenderness to palpation.  No erythema or heat.  NEURO: alert & oriented x 3 with fluent speech, no focal motor/sensory deficits, gait normal   LABORATORY DATA: CBC    Component Value Date/Time   WBC 9.6 11/17/2014 1048   RBC 4.11* 11/17/2014 1048   HGB 11.7* 11/17/2014 1048   HCT 34.8* 11/17/2014 1048   HCT 42 11/08/2011 1452   PLT 336 11/17/2014 1048   MCV 84.7 11/17/2014 1048   MCV 85.8 11/08/2011 1452   MCH 28.5 11/17/2014 1048   MCHC 33.6 11/17/2014 1048   RDW 13.0 11/17/2014 1048   LYMPHSABS 4.2* 11/17/2014 1048   MONOABS 0.5 11/17/2014 1048   EOSABS 0.3 11/17/2014 1048   BASOSABS 0.0 11/17/2014 1048      Chemistry      Component Value Date/Time   NA 136*  04/08/2014 1305   NA 137 11/08/2011 1450   K 4.2 04/08/2014 1305   K 3.9 11/08/2011 1450   CL 97 04/08/2014 1305   CL 103 08/29/2011 1618   CO2 27 04/08/2014 1305   CO2 32 08/29/2011 1618   BUN 14 04/08/2014 1305   BUN 8 11/08/2011 1450   CREATININE 0.95 04/08/2014 1305   CREATININE 0.78 05/02/2013 1134      Component Value Date/Time   CALCIUM 9.2 04/08/2014 1305   CALCIUM 9.3 11/08/2011 1450   ALKPHOS 105 01/13/2014 1017   ALKPHOS 89 11/08/2011 1450   AST 22 01/13/2014 1017   AST 21 11/08/2011 1450   ALT 25 01/13/2014 1017   BILITOT 1.0 01/13/2014 1017   BILITOT 0.6 08/29/2011 1618      Lab Results  Component Value Date   DDIMER 0.29 11/17/2014      ASSESSMENT AND PLAN:  Heterozygous factor V Leiden mutation On Xarelto 20 mg daily.  Compliance encouraged. Return in 2 months for follow-up   Narcotic pain contract exists with [facility] Signed on 11/17/2014.   Chronic abdominal pain Secondary to recurrent pancreatitis.  Not a hematologic issue.  On high dose narcotics for pain controlled, enabled by Dr. Zigmund DanielFormanek.  Will provide a 2 month supply of Methadone and Oxycodone. Pain contract signed.     THERAPY PLAN:  No signs or symptoms of VTE. Continue Xarelto 20 mg daily. Compliance encouraged.  All questions were answered. The patient knows to call the clinic with any problems, questions or concerns. We can certainly see the patient much sooner if necessary.  Patient and plan discussed with Dr. Loma MessingShannon Penland and she is in agreement with the aforementioned.   This note is electronically signed by: Dellis AnesKEFALAS,Ruslan Mccabe 01/12/2015 12:40 PM

## 2015-01-05 NOTE — Assessment & Plan Note (Signed)
On Xarelto 20 mg daily.  Compliance encouraged. Return in 2 months for follow-up   

## 2015-01-05 NOTE — Assessment & Plan Note (Signed)
Secondary to recurrent pancreatitis.  Not a hematologic issue.  On high dose narcotics for pain controlled, enabled by Dr. Formanek.  Will provide a 2 month supply of Methadone and Oxycodone. Pain contract signed.   

## 2015-01-12 ENCOUNTER — Encounter (HOSPITAL_COMMUNITY): Payer: Medicare (Managed Care)

## 2015-01-12 ENCOUNTER — Encounter (HOSPITAL_COMMUNITY): Payer: Medicare (Managed Care) | Attending: Hematology and Oncology | Admitting: Oncology

## 2015-01-12 VITALS — BP 166/88 | HR 77 | Temp 97.9°F | Resp 20 | Wt 221.0 lb

## 2015-01-12 DIAGNOSIS — D6851 Activated protein C resistance: Secondary | ICD-10-CM

## 2015-01-12 DIAGNOSIS — G8929 Other chronic pain: Secondary | ICD-10-CM

## 2015-01-12 DIAGNOSIS — D6859 Other primary thrombophilia: Secondary | ICD-10-CM | POA: Diagnosis present

## 2015-01-12 DIAGNOSIS — R2243 Localized swelling, mass and lump, lower limb, bilateral: Secondary | ICD-10-CM | POA: Diagnosis not present

## 2015-01-12 DIAGNOSIS — I87009 Postthrombotic syndrome without complications of unspecified extremity: Secondary | ICD-10-CM | POA: Insufficient documentation

## 2015-01-12 DIAGNOSIS — R109 Unspecified abdominal pain: Secondary | ICD-10-CM

## 2015-01-12 DIAGNOSIS — G4700 Insomnia, unspecified: Secondary | ICD-10-CM | POA: Diagnosis present

## 2015-01-12 DIAGNOSIS — Z79899 Other long term (current) drug therapy: Secondary | ICD-10-CM

## 2015-01-12 DIAGNOSIS — K859 Acute pancreatitis, unspecified: Secondary | ICD-10-CM

## 2015-01-12 LAB — CBC WITH DIFFERENTIAL/PLATELET
BASOS ABS: 0.1 10*3/uL (ref 0.0–0.1)
Basophils Relative: 1 % (ref 0–1)
Eosinophils Absolute: 0.3 10*3/uL (ref 0.0–0.7)
Eosinophils Relative: 3 % (ref 0–5)
HCT: 44.4 % (ref 39.0–52.0)
Hemoglobin: 12.7 g/dL — ABNORMAL LOW (ref 13.0–17.0)
Lymphocytes Relative: 35 % (ref 12–46)
Lymphs Abs: 3.6 10*3/uL (ref 0.7–4.0)
MCH: 28.5 pg (ref 26.0–34.0)
MCHC: 28.6 g/dL — ABNORMAL LOW (ref 30.0–36.0)
MCV: 99.6 fL (ref 78.0–100.0)
Monocytes Absolute: 0.5 10*3/uL (ref 0.1–1.0)
Monocytes Relative: 5 % (ref 3–12)
Neutro Abs: 5.9 10*3/uL (ref 1.7–7.7)
Neutrophils Relative %: 56 % (ref 43–77)
PLATELETS: 335 10*3/uL (ref 150–400)
RBC: 4.46 MIL/uL (ref 4.22–5.81)
RDW: 13.4 % (ref 11.5–15.5)
WBC: 10.5 10*3/uL (ref 4.0–10.5)

## 2015-01-12 LAB — COMPREHENSIVE METABOLIC PANEL
ALT: 17 U/L (ref 0–53)
ANION GAP: 7 (ref 5–15)
AST: 24 U/L (ref 0–37)
Albumin: 4.1 g/dL (ref 3.5–5.2)
Alkaline Phosphatase: 110 U/L (ref 39–117)
BILIRUBIN TOTAL: 0.5 mg/dL (ref 0.3–1.2)
BUN: 10 mg/dL (ref 6–23)
CHLORIDE: 102 mmol/L (ref 96–112)
CO2: 27 mmol/L (ref 19–32)
CREATININE: 1.03 mg/dL (ref 0.50–1.35)
Calcium: 9.2 mg/dL (ref 8.4–10.5)
GFR calc Af Amer: >90 mL/min (ref >90)
GFR, EST NON AFRICAN AMERICAN: 80 mL/min — AB (ref >90)
Glucose, Bld: 85 mg/dL (ref 70–99)
Potassium: 3.6 mmol/L (ref 3.5–5.1)
Sodium: 136 mmol/L (ref 135–145)
Total Protein: 7.8 g/dL (ref 6.0–8.3)

## 2015-01-12 NOTE — Patient Instructions (Addendum)
[facility] at [facility] Discharge Instructions  RECOMMENDATIONS MADE BY THE CONSULTANT AND ANY TEST RESULTS WILL BE SENT TO YOUR REFERRING PHYSICIAN.   Return in 7 weeks    Thank you for choosing [facility] at [facility] to provide your oncology and hematology care.  To afford each patient quality time with our provider, please arrive at least 15 minutes before your scheduled appointment time.    You need to re-schedule your appointment should you arrive 10 or more minutes late.  We strive to give you quality time with our providers, and arriving late affects you and other patients whose appointments are after yours.  Also, if you no show three or more times for appointments you may be dismissed from the clinic at the providers discretion.     Again, thank you for choosing [facility].  Our hope is that these requests will decrease the amount of time that you wait before being seen by our physicians.       _____________________________________________________________  Should you have questions after your visit to [facility], please contact our office at [phone] between the hours of 8:30 a.m. and 4:30 p.m.  Voicemails left after 4:30 p.m. will not be returned until the following business day.  For prescription refill requests, have your pharmacy contact our office.

## 2015-03-02 ENCOUNTER — Encounter (HOSPITAL_COMMUNITY): Payer: Self-pay | Admitting: Oncology

## 2015-03-02 ENCOUNTER — Ambulatory Visit (HOSPITAL_COMMUNITY): Payer: Medicare Other | Admitting: Hematology & Oncology

## 2015-03-11 ENCOUNTER — Encounter (HOSPITAL_BASED_OUTPATIENT_CLINIC_OR_DEPARTMENT_OTHER): Payer: Medicare (Managed Care)

## 2015-03-11 ENCOUNTER — Encounter (HOSPITAL_COMMUNITY): Payer: Self-pay | Admitting: Oncology

## 2015-03-11 ENCOUNTER — Encounter (HOSPITAL_COMMUNITY): Payer: Medicare (Managed Care) | Attending: Hematology and Oncology | Admitting: Oncology

## 2015-03-11 VITALS — BP 143/70 | HR 67 | Temp 98.3°F | Resp 16 | Wt 212.7 lb

## 2015-03-11 DIAGNOSIS — G8929 Other chronic pain: Secondary | ICD-10-CM | POA: Diagnosis not present

## 2015-03-11 DIAGNOSIS — I87009 Postthrombotic syndrome without complications of unspecified extremity: Secondary | ICD-10-CM | POA: Diagnosis present

## 2015-03-11 DIAGNOSIS — G4700 Insomnia, unspecified: Secondary | ICD-10-CM | POA: Diagnosis present

## 2015-03-11 DIAGNOSIS — D688 Other specified coagulation defects: Secondary | ICD-10-CM

## 2015-03-11 DIAGNOSIS — D6859 Other primary thrombophilia: Secondary | ICD-10-CM | POA: Insufficient documentation

## 2015-03-11 DIAGNOSIS — R2243 Localized swelling, mass and lump, lower limb, bilateral: Secondary | ICD-10-CM | POA: Insufficient documentation

## 2015-03-11 DIAGNOSIS — D6851 Activated protein C resistance: Secondary | ICD-10-CM

## 2015-03-11 DIAGNOSIS — R109 Unspecified abdominal pain: Secondary | ICD-10-CM | POA: Diagnosis not present

## 2015-03-11 DIAGNOSIS — Z79899 Other long term (current) drug therapy: Secondary | ICD-10-CM

## 2015-03-11 LAB — CBC WITH DIFFERENTIAL/PLATELET
Basophils Absolute: 0 10*3/uL (ref 0.0–0.1)
Basophils Relative: 0 % (ref 0–1)
EOS ABS: 0.3 10*3/uL (ref 0.0–0.7)
Eosinophils Relative: 3 % (ref 0–5)
HCT: 35.7 % — ABNORMAL LOW (ref 39.0–52.0)
Hemoglobin: 11.6 g/dL — ABNORMAL LOW (ref 13.0–17.0)
LYMPHS ABS: 4 10*3/uL (ref 0.7–4.0)
LYMPHS PCT: 44 % (ref 12–46)
MCH: 27.2 pg (ref 26.0–34.0)
MCHC: 32.5 g/dL (ref 30.0–36.0)
MCV: 83.6 fL (ref 78.0–100.0)
MONOS PCT: 5 % (ref 3–12)
Monocytes Absolute: 0.5 10*3/uL (ref 0.1–1.0)
NEUTROS PCT: 48 % (ref 43–77)
Neutro Abs: 4.4 10*3/uL (ref 1.7–7.7)
Platelets: 367 10*3/uL (ref 150–400)
RBC: 4.27 MIL/uL (ref 4.22–5.81)
RDW: 13.2 % (ref 11.5–15.5)
WBC: 9.2 10*3/uL (ref 4.0–10.5)

## 2015-03-11 LAB — FERRITIN: Ferritin: 29 ng/mL (ref 24–336)

## 2015-03-11 MED ORDER — METHADONE HCL 10 MG PO TABS
50.0000 mg | ORAL_TABLET | Freq: Three times a day (TID) | ORAL | Status: DC
Start: 2015-03-11 — End: 2015-03-11

## 2015-03-11 MED ORDER — OXYCODONE HCL 30 MG PO TABS: ORAL_TABLET | ORAL | Status: DC

## 2015-03-11 MED ORDER — METHADONE HCL 10 MG PO TABS: 50.0000 mg | ORAL_TABLET | Freq: Three times a day (TID) | ORAL | Status: DC

## 2015-03-11 MED ORDER — OXYCODONE HCL 30 MG PO TABS
ORAL_TABLET | ORAL | Status: DC
Start: 2015-03-11 — End: 2015-05-06

## 2015-03-11 NOTE — Progress Notes (Signed)
Labs drawn

## 2015-03-11 NOTE — Assessment & Plan Note (Signed)
On Xarelto 20 mg daily.  Compliance encouraged. Return in 2 months for follow-up

## 2015-03-11 NOTE — Assessment & Plan Note (Signed)
Signed on 11/17/2014.

## 2015-03-11 NOTE — Assessment & Plan Note (Signed)
Secondary to recurrent pancreatitis.  Not a hematologic issue.  On high dose narcotics for pain controlled, enabled by Dr. Formanek.  Will provide a 2 month supply of Methadone and Oxycodone. Pain contract signed.   

## 2015-03-11 NOTE — Progress Notes (Signed)
No primary care provider on file. No primary provider on file.  Heterozygous factor V Leiden mutation - Plan: CBC with Differential, Ferritin  Narcotic pain contract exists with Alex [facility]  Chronic abdominal pain  Localized swelling of both lower legs - Plan: methadone (DOLOPHINE) 10 MG tablet, oxycodone (ROXICODONE) 30 MG immediate release tablet, DISCONTINUED: methadone (DOLOPHINE) 10 MG tablet, DISCONTINUED: oxycodone (ROXICODONE) 30 MG immediate release tablet  CURRENT THERAPY: Xarelto anticoagulation without any evidence of VTE recurrence. Started by Dr. Zigmund Hoffman.  INTERVAL HISTORY: Alex Hoffman 54 y.o. male returns for followup of Factor V Leiden heterozygosity on hypercoag panel on 07/22/2013 without any other abnormalities. 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  Chart is reviewed in detail. Of note, when Dr. Zigmund Hoffman (Hem/Onc Locum) consumed Mr. Alex Hoffman's care after he was released from [facility] in [place] from the care of Dr. Margo CommonKen Hoffman for issues related to his pain medication, he consumed the patient's analgesia needs.  Today, I have provided the patient with a 2 month supply of his pain medications. His pain is unrelated to hematology with chronic abdominal pain from pancreatitis, but we know from literature, that chronic pain is historically poorly controlled from pancreatitis.    I personally reviewed and went over laboratory results with the patient.  The results are noted within this dictation.  Ferritin is pending from today.  He reports B/L LE edema which is noted on physical exam.  He is on Lasix by his cardiologist.  I have encouraged him to continue this medication.  He denies any signs or symptoms of VTE.  Hematologically, he denies any complaints and ROS questioning is  negative.  Past Medical History  Diagnosis Date  . Pulmonary embolism 12/2010  . Cystic thyroid nodule     left  . HTN (hypertension)   . Heterozygous factor V Leiden mutation 12/2010  . Pancreatitis 05/2011    EUS Dr Christella Hoffman 06/09/11-> dilated main pancreatic duct in HOP, 2-3 filling defects distal pancreatic duct, peripancreatic fluid resolved  . C. difficile colitis 01/2011?  Marland Kitchen. Arthritis   . Clotting disorder   . GERD (gastroesophageal reflux disease)   . Chills   . Fever   . Weight loss   . Hearing loss   . Nasal congestion   . Leg swelling   . Palpitations   . Abdominal distension   . Abdominal pain   . Nausea & vomiting   . Rectal pain   . Difficulty urinating   . Arthritis pain   . Headache(784.0)   . Weakness   . Bruises easily   . Anemia   . Dizziness   . Numbness and tingling     both hands, and both legs and feet  . Joint pain   . Blood in urine     hx of, none current  . Frequent urination at night   . Pneumonia as child  . Abnormal EKG     hx left bundle branch block on ekg  . Sleep apnea     STOPBANG=5  . Heterozygous factor V Leiden mutation   . Prostatitis, chronic   . Pancreatitis   . Pancreatitis 12/2011  . Arthritis     lower back. Alex Hoffman  . GERD (gastroesophageal reflux disease)   . HTN (hypertension) 01/28/2014  .  BPH (benign prostatic hyperplasia) 01/28/2014  . Factor V Leiden 07/19/2013  . 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     is allergic to reglan.  Alex Hoffman had no medications administered during this visit.  Past Surgical History  Procedure Laterality Date  . Neck surgery  1999    C5/C6 fusion, limited neck mobility especially turning to left  . Knee surgery  1980's    arthroscopy, left knee  . Femur fracture surgery  age 44    right leg  . Nerve surgery  1980's    right thumb  . Colonoscopy  01/2011    [facility] at [facility]-->normal  . Esophagogastroduodenoscopy  01/2011    [facility] at [facility]-->bile reflux in stomach-->patient c/o inadequate conscious sedation (Fentanyl /Versed )  . Ercp  07/2011    Alex Hoffman-> pancreatic enterotomy, pancreatic duct stones removed, bile duct not manipulated, no stents  . Cholecystectomy  01/25/2012    Procedure: LAPAROSCOPIC CHOLECYSTECTOMY WITH INTRAOPERATIVE CHOLANGIOGRAM;  Surgeon: Robyne Askew, MD;  Location: [facility];  Service: General;  Laterality: N/A;  . Cystoscopy with hydrodistension and biopsy  01/25/2012    Procedure: CYSTOSCOPY/BIOPSY/HYDRODISTENSION;  Surgeon: Anner Crete, MD;  Location: [facility];  Service: Urology;  Laterality: N/A;  Cystoscopy and Hydrodistension of the Bladder  . Colonoscopy with propofol  11/22/2012    UJW:[id number] anal rectum/fissure not identified although he certainly could have a chronic anal fissure   . Esophagogastroduodenoscopy (egd) with propofol N/A 02/13/2014    [id number] EGD as described above s/p bx (mild changes of reflux on esophageal bx  . Esophageal biopsy N/A 02/13/2014    Procedure: BIOPSY;  Surgeon: Corbin Ade, MD;  Location: [facility];  Service: Endoscopy;  Laterality: N/A;  . Esophagogastroduodenoscopy (egd) with propofol N/A 04/10/2014    Dr. Jena Gauss: patulous EG junction, s/p 39 F Maloney dilation empirically  . Esophageal dilation N/A 04/10/2014    Procedure: ESOPHAGEAL DILATION;  Surgeon: Corbin Ade, MD;  Location: [facility];  Service: Endoscopy;  Laterality: N/A;  Malloney  54,   . Cardiac catheterization  10/15/14    Denies any headaches, dizziness, double vision, fevers, chills, night sweats, nausea, vomiting, diarrhea, constipation, chest pain, heart palpitations, shortness of breath, blood in stool, black tarry stool, urinary pain, urinary burning, urinary frequency, hematuria.   PHYSICAL EXAMINATION  ECOG PERFORMANCE STATUS: 1 - Symptomatic but completely ambulatory  Filed Vitals:   03/11/15 0959  BP: 143/70  Pulse: 67  Temp: 98.3 F (36.8 C)  Resp: 16    GENERAL:alert, no distress, well nourished, well developed, comfortable, cooperative and smiling SKIN: skin color, texture, turgor are normal, no rashes or significant lesions HEAD: Normocephalic, No masses, lesions, tenderness or abnormalities EYES: normal, PERRLA, EOMI, Conjunctiva are pink and non-injected EARS: External ears normal OROPHARYNX:lips, buccal mucosa, and tongue normal and mucous membranes are moist  NECK: supple, no adenopathy, thyroid normal size, non-tender, without nodularity, no stridor, non-tender, trachea midline LYMPH:  no palpable lymphadenopathy BREAST:not examined LUNGS: clear to auscultation  HEART: regular rate &  rhythm, no murmurs, no gallops, S1 normal and S2 normal ABDOMEN:abdomen soft, non-tender and normal bowel sounds BACK: Back symmetric, no curvature., No CVA tenderness EXTREMITIES:less then 2 second capillary refill, no joint deformities, effusion, or inflammation, no skin discoloration, no cyanosis, B/L LE edema, 2+ pitting. NEURO: alert & oriented x 3 with fluent speech, no focal motor/sensory deficits, gait normal   LABORATORY DATA: CBC    Component Value Date/Time   WBC 9.2 03/11/2015 0954   RBC 4.27 03/11/2015 0954   HGB 11.6* 03/11/2015 0954   HCT 35.7* 03/11/2015 0954   HCT 42 11/08/2011 1452   PLT 367 03/11/2015 0954   MCV 83.6 03/11/2015 0954   MCV 85.8 11/08/2011 1452   MCH 27.2 03/11/2015 0954   MCHC 32.5 03/11/2015 0954   RDW 13.2  03/11/2015 0954   LYMPHSABS 4.0 03/11/2015 0954   MONOABS 0.5 03/11/2015 0954   EOSABS 0.3 03/11/2015 0954   BASOSABS 0.0 03/11/2015 0954      Chemistry      Component Value Date/Time   NA 136 01/12/2015 1210   NA 137 11/08/2011 1450   K 3.6 01/12/2015 1210   K 3.9 11/08/2011 1450   CL 102 01/12/2015 1210   CL 103 08/29/2011 1618   CO2 27 01/12/2015 1210   CO2 32 08/29/2011 1618   BUN 10 01/12/2015 1210   BUN 8 11/08/2011 1450   CREATININE 1.03 01/12/2015 1210   CREATININE 0.78 05/02/2013 1134      Component Value Date/Time   CALCIUM 9.2 01/12/2015 1210   CALCIUM 9.3 11/08/2011 1450   ALKPHOS 110 01/12/2015 1210   ALKPHOS 89 11/08/2011 1450   AST 24 01/12/2015 1210   AST 21 11/08/2011 1450   ALT 17 01/12/2015 1210   BILITOT 0.5 01/12/2015 1210   BILITOT 0.6 08/29/2011 1618        ASSESSMENT AND PLAN:  Heterozygous factor V Leiden mutation On Xarelto 20 mg daily.  Compliance encouraged. Return in 2 months for follow-up     Narcotic pain contract exists with [facility] Signed on 11/17/2014.     Chronic abdominal pain Secondary to recurrent pancreatitis.  Not a hematologic issue.  On high dose narcotics for pain controlled, enabled by Dr. Zigmund Hoffman.  Will provide a 2 month supply of Methadone and Oxycodone. Pain contract signed.       THERAPY PLAN:  We will continue with pain control.  If his pain becomes unmanageable with current pain medication regimen, referral to pain clinic would be in the patient's best interest.  All questions were answered. The patient knows to call the clinic with any problems, questions or concerns. We can certainly see the patient much sooner if necessary.  Patient and plan discussed with Dr. Loma MessingShannon Penland and she is in agreement with the aforementioned.   This note is electronically signed by: Dellis AnesKEFALAS,Aissata Wilmore 03/11/2015 10:28 AM

## 2015-03-11 NOTE — Patient Instructions (Signed)
..  [facility] at [facility] Discharge Instructions  RECOMMENDATIONS MADE BY THE CONSULTANT AND ANY TEST RESULTS WILL BE SENT TO YOUR REFERRING PHYSICIAN.  Exam today by Dellis Aneshomas Kefalas, PA-C. Labs today.  Will call with results. Return in 2 months for follow up appointment. Continue Lasix as prescribed by heart doctor. Call for any new or worsening symptoms, questions, or concerns.   Thank you for choosing [facility] at [facility] to provide your oncology and hematology care.  To afford each patient quality time with our provider, please arrive at least 15 minutes before your scheduled appointment time.    You need to re-schedule your appointment should you arrive 10 or more minutes late.  We strive to give you quality time with our providers, and arriving late affects you and other patients whose appointments are after yours.  Also, if you no show three or more times for appointments you may be dismissed from the clinic at the providers discretion.     Again, thank you for choosing [facility].  Our hope is that these requests will decrease the amount of time that you wait before being seen by our physicians.       _____________________________________________________________  Should you have questions after your visit to [facility], please contact our office at [phone] between the hours of 8:30 a.m. and 4:30 p.m.  Voicemails left after 4:30 p.m. will not be returned until the following business day.  For prescription refill requests, have your pharmacy contact our office.

## 2015-03-12 ENCOUNTER — Other Ambulatory Visit (HOSPITAL_COMMUNITY): Payer: Self-pay | Admitting: Oncology

## 2015-03-12 ENCOUNTER — Encounter (HOSPITAL_COMMUNITY): Payer: Self-pay | Admitting: Emergency Medicine

## 2015-03-12 ENCOUNTER — Telehealth (HOSPITAL_COMMUNITY): Payer: Self-pay | Admitting: *Deleted

## 2015-03-12 DIAGNOSIS — E611 Iron deficiency: Secondary | ICD-10-CM

## 2015-03-12 NOTE — Progress Notes (Signed)
Pt had a question about xarelto.  Pt advised to talk with dentist to see if the procedure would require him to stop xarelto.  If he needed to he could stop one prior to procedure and then restart taking the medication the day after the procedure.  He verbalizes understanding

## 2015-03-12 NOTE — Telephone Encounter (Signed)
Attempted to call patient, no answer and no voice mail ability

## 2015-03-12 NOTE — Telephone Encounter (Signed)
-----   Message from Ellouise Newerhomas S Kefalas, PA-C sent at 03/12/2015  7:52 AM EDT ----- He would benefit from a 1 time dose of IV Feraheme 510 mg.  Orders signed and held.

## 2015-03-13 ENCOUNTER — Telehealth (HOSPITAL_COMMUNITY): Payer: Self-pay

## 2015-03-13 NOTE — Telephone Encounter (Signed)
Returned call to patient's wife to discuss test results and iron infusion.  Explained that his ferritin level is low and that he might benifit from iron infusion and that getting dental work will not interfere with iron infusion.  Questions if Alex Hoffman needs to stop the xarelto 4 days prior to his dental work as that is what he has done in the past but the dentist told them it was not necessary.  Discussed with Alex Aneshomas Kefalas, PA-C and patient can hold his dosage the day before the procedure and resume the day after the procedure.  Verbalized understanding of instructions.

## 2015-03-17 ENCOUNTER — Encounter: Payer: Self-pay | Admitting: Internal Medicine

## 2015-03-19 ENCOUNTER — Encounter (HOSPITAL_BASED_OUTPATIENT_CLINIC_OR_DEPARTMENT_OTHER): Payer: Medicare (Managed Care)

## 2015-03-19 ENCOUNTER — Encounter (HOSPITAL_COMMUNITY): Payer: Self-pay

## 2015-03-19 DIAGNOSIS — E611 Iron deficiency: Secondary | ICD-10-CM | POA: Diagnosis not present

## 2015-03-19 MED ORDER — SODIUM CHLORIDE 0.9 % IV SOLN
510.0000 mg | Freq: Once | INTRAVENOUS | Status: AC
  Administered 2015-03-19: 510 mg via INTRAVENOUS
  Filled 2015-03-19: qty 17

## 2015-03-19 MED ORDER — SODIUM CHLORIDE 0.9 % IV SOLN
INTRAVENOUS | Status: DC
  Administered 2015-03-19: 14:00:00 via INTRAVENOUS

## 2015-03-19 NOTE — Progress Notes (Signed)
Tolerated well

## 2015-04-08 ENCOUNTER — Other Ambulatory Visit (HOSPITAL_COMMUNITY): Payer: Self-pay | Admitting: Oncology

## 2015-04-08 DIAGNOSIS — G4700 Insomnia, unspecified: Secondary | ICD-10-CM

## 2015-04-08 MED ORDER — DIAZEPAM 5 MG PO TABS
5.0000 mg | ORAL_TABLET | Freq: Four times a day (QID) | ORAL | Status: DC | PRN
Start: 2015-04-08 — End: 2015-05-06

## 2015-05-06 ENCOUNTER — Encounter (HOSPITAL_COMMUNITY): Payer: Medicare (Managed Care) | Attending: Hematology and Oncology | Admitting: Oncology

## 2015-05-06 ENCOUNTER — Encounter (HOSPITAL_COMMUNITY): Payer: Self-pay | Admitting: Oncology

## 2015-05-06 VITALS — BP 150/79 | HR 76 | Temp 98.1°F | Resp 16 | Wt 205.6 lb

## 2015-05-06 DIAGNOSIS — G4700 Insomnia, unspecified: Secondary | ICD-10-CM | POA: Diagnosis present

## 2015-05-06 DIAGNOSIS — R2243 Localized swelling, mass and lump, lower limb, bilateral: Secondary | ICD-10-CM | POA: Diagnosis present

## 2015-05-06 DIAGNOSIS — Z7901 Long term (current) use of anticoagulants: Secondary | ICD-10-CM

## 2015-05-06 DIAGNOSIS — I87009 Postthrombotic syndrome without complications of unspecified extremity: Secondary | ICD-10-CM | POA: Diagnosis present

## 2015-05-06 DIAGNOSIS — R109 Unspecified abdominal pain: Secondary | ICD-10-CM | POA: Diagnosis not present

## 2015-05-06 DIAGNOSIS — D6859 Other primary thrombophilia: Secondary | ICD-10-CM | POA: Diagnosis present

## 2015-05-06 DIAGNOSIS — G8929 Other chronic pain: Secondary | ICD-10-CM | POA: Diagnosis not present

## 2015-05-06 DIAGNOSIS — D509 Iron deficiency anemia, unspecified: Secondary | ICD-10-CM | POA: Diagnosis not present

## 2015-05-06 DIAGNOSIS — Z86718 Personal history of other venous thrombosis and embolism: Secondary | ICD-10-CM

## 2015-05-06 DIAGNOSIS — F1410 Cocaine abuse, uncomplicated: Secondary | ICD-10-CM

## 2015-05-06 DIAGNOSIS — D6851 Activated protein C resistance: Secondary | ICD-10-CM | POA: Diagnosis not present

## 2015-05-06 DIAGNOSIS — Z79899 Other long term (current) drug therapy: Secondary | ICD-10-CM

## 2015-05-06 DIAGNOSIS — Z86711 Personal history of pulmonary embolism: Secondary | ICD-10-CM

## 2015-05-06 LAB — CBC WITH DIFFERENTIAL/PLATELET
BASOS ABS: 0 10*3/uL (ref 0.0–0.1)
BASOS PCT: 0 % (ref 0–1)
Eosinophils Absolute: 0.2 10*3/uL (ref 0.0–0.7)
Eosinophils Relative: 2 % (ref 0–5)
HCT: 39.5 % (ref 39.0–52.0)
HEMOGLOBIN: 13.1 g/dL (ref 13.0–17.0)
Lymphocytes Relative: 27 % (ref 12–46)
Lymphs Abs: 3 10*3/uL (ref 0.7–4.0)
MCH: 28.4 pg (ref 26.0–34.0)
MCHC: 33.2 g/dL (ref 30.0–36.0)
MCV: 85.7 fL (ref 78.0–100.0)
Monocytes Absolute: 0.5 10*3/uL (ref 0.1–1.0)
Monocytes Relative: 5 % (ref 3–12)
NEUTROS ABS: 7.4 10*3/uL (ref 1.7–7.7)
Neutrophils Relative %: 66 % (ref 43–77)
Platelets: 376 10*3/uL (ref 150–400)
RBC: 4.61 MIL/uL (ref 4.22–5.81)
RDW: 13.5 % (ref 11.5–15.5)
WBC: 11.2 10*3/uL — ABNORMAL HIGH (ref 4.0–10.5)

## 2015-05-06 LAB — BASIC METABOLIC PANEL
Anion gap: 6 (ref 5–15)
BUN: 9 mg/dL (ref 6–20)
CALCIUM: 9 mg/dL (ref 8.9–10.3)
CO2: 27 mmol/L (ref 22–32)
Chloride: 105 mmol/L (ref 101–111)
Creatinine, Ser: 0.84 mg/dL (ref 0.61–1.24)
GFR calc Af Amer: >60 mL/min (ref >60)
Glucose, Bld: 97 mg/dL (ref 65–99)
POTASSIUM: 3.6 mmol/L (ref 3.5–5.1)
Sodium: 138 mmol/L (ref 135–145)

## 2015-05-06 LAB — FERRITIN: FERRITIN: 92 ng/mL (ref 24–336)

## 2015-05-06 LAB — IRON AND TIBC
Iron: 68 ug/dL (ref 45–182)
Saturation Ratios: 18 % (ref 17.9–39.5)
TIBC: 368 ug/dL (ref 250–450)
UIBC: 300 ug/dL

## 2015-05-06 LAB — RAPID URINE DRUG SCREEN, HOSP PERFORMED
AMPHETAMINES: NOT DETECTED
BARBITURATES: NOT DETECTED
BENZODIAZEPINES: POSITIVE — AB
COCAINE: POSITIVE — AB
Opiates: NOT DETECTED
TETRAHYDROCANNABINOL: NOT DETECTED

## 2015-05-06 MED ORDER — DIAZEPAM 5 MG PO TABS: 5.0000 mg | ORAL_TABLET | Freq: Four times a day (QID) | ORAL | Status: DC | PRN

## 2015-05-06 MED ORDER — METHADONE HCL 10 MG PO TABS: 50.0000 mg | ORAL_TABLET | Freq: Three times a day (TID) | ORAL | Status: DC

## 2015-05-06 MED ORDER — OXYCODONE HCL 30 MG PO TABS: ORAL_TABLET | ORAL | Status: DC

## 2015-05-06 NOTE — Assessment & Plan Note (Signed)
Secondary to recurrent pancreatitis.  Not a hematologic issue.  On high dose narcotics for pain controlled, enabled by Dr. Zigmund DanielFormanek.  Will provide a 2 month supply of Methadone and Oxycodone. Pain contract signed.

## 2015-05-06 NOTE — Assessment & Plan Note (Addendum)
Factor V Leiden heterozygosity on hypercoag panel on 07/22/2013 without any other abnormalities. 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  Taking Xarelto 20 mg daily.  Compliance encouraged.   Iron deficiency noted in May 2016.  510 mg of IV feraheme was given on 03/19/2015.    Labs today: CBC diff, iron/TIBC, ferritin.  Return in 2 months for follow-up

## 2015-05-06 NOTE — Progress Notes (Signed)
Alex Hoffman presented for labwork. Labs per MD order drawn via Peripheral Line 23 gauge needle inserted in right AC.  Good blood return present. Procedure without incident.  Needle removed intact. Patient tolerated procedure well.

## 2015-05-06 NOTE — Assessment & Plan Note (Addendum)
Signed on 11/17/2014.  Urine drug screen today.  ADDENDUM: URINE DRUG SCREEN DONE TODAY.  TESTING POSITIVE FOR COCAINE.  THIS IS A BREACH OF PAIN CONTRACT.  DUE TO THIS, HE WILL NO LONGER RECEIVE ANY NARCOTIC FROM THE [facility].  PATIENT WILL BE INFORMED.  Drugs of Abuse     Component Value Date/Time   LABOPIA NONE DETECTED 05/06/2015 1438   COCAINSCRNUR POSITIVE* 05/06/2015 1438   LABBENZ POSITIVE* 05/06/2015 1438   AMPHETMU NONE DETECTED 05/06/2015 1438   THCU NONE DETECTED 05/06/2015 1438   LABBARB NONE DETECTED 05/06/2015 1438

## 2015-05-06 NOTE — Patient Instructions (Signed)
[facility] Discharge Instructions  RECOMMENDATIONS MADE BY THE CONSULTANT AND ANY TEST RESULTS WILL BE SENT TO YOUR REFERRING PHYSICIAN.  Exam and discussion with Jenita Seashoreom Kefalas, PA-C Lab work today. Return in 2 months for office visit.  Thank you for choosing  [facility] to provide your oncology and hematology care.  To afford each patient quality time with our provider, please arrive at least 15 minutes before your scheduled appointment time.    You need to re-schedule your appointment should you arrive 10 or more minutes late.  We strive to give you quality time with our providers, and arriving late affects you and other patients whose appointments are after yours.  Also, if you no show three or more times for appointments you may be dismissed from the clinic at the providers discretion.     Again, thank you for choosing [facility].  Our hope is that these requests will decrease the amount of time that you wait before being seen by our physicians.       _____________________________________________________________  Should you have questions after your visit to [facility], please contact our office at [phone] between the hours of 8:30 a.m. and 4:30 p.m.  Voicemails left after 4:30 p.m. will not be returned until the following business day.  For prescription refill requests, have your pharmacy contact our office.

## 2015-05-06 NOTE — Progress Notes (Signed)
No primary care provider on file. No primary provider on file.  Insomnia - Plan: diazepam (VALIUM) 5 MG tablet  Localized swelling of both lower legs - Plan: methadone (DOLOPHINE) 10 MG tablet, oxycodone (ROXICODONE) 30 MG immediate release tablet, DISCONTINUED: methadone (DOLOPHINE) 10 MG tablet, DISCONTINUED: oxycodone (ROXICODONE) 30 MG immediate release tablet, DISCONTINUED: oxycodone (ROXICODONE) 30 MG immediate release tablet, DISCONTINUED: methadone (DOLOPHINE) 10 MG tablet  Heterozygous factor V Leiden mutation - Plan: CBC with Differential, Basic metabolic panel, CBC with Differential, Basic metabolic panel  Iron deficiency anemia - Plan: CBC with Differential, Iron and TIBC, Ferritin, CBC with Differential, Iron and TIBC, Ferritin  Narcotic pain contract exists with [facility] - Plan: Urine rapid drug screen (hosp performed), Urine rapid drug screen (hosp performed)  Chronic abdominal pain  CURRENT THERAPY: Xarelto anticoagulation without any evidence of VTE recurrence. Started by Dr. Zigmund Daniel.  S/P IV Feraheme 510 mg on 03/19/2015 when ferritin was 29.  INTERVAL HISTORY: Alex Hoffman 54 y.o. male returns for followup of Factor V Leiden heterozygosity on hypercoag panel on 07/22/2013 without any other abnormalities. 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  Chart is reviewed in detail. Of note, when Dr. Zigmund Daniel (Hem/Onc Locum) consumed Alex Hoffman care after he was released from [facility] in [place] from the care of Dr. Margo Common for issues related to his pain medication, he consumed the patient's analgesia needs.  Today, I have provided the patient with a 2 month supply of his pain medications. His pain is unrelated to hematology with chronic abdominal pain from pancreatitis, but we know from  literature, that chronic pain is historically poorly controlled from pancreatitis.  Additionally, he likely has chronic pain from lower extremity DVTs and arterial disease from smoking.  I personally reviewed and went over laboratory results with the patient.  The results are noted within this dictation.  Labs are pending.  He reports B/L LE edema which is noted on physical exam.  He is on Lasix by his cardiologist.  I have encouraged him to continue this medication.  He denies any signs or symptoms of VTE.  He denies any significant change in his well being since receiving iron infusion.  He reports that he may feel a little bit better but his chronic pain continues to fatigue him.  Hematologically, he denies any complaints and ROS questioning is negative.  Past Medical History  Diagnosis Date  . Pulmonary embolism 12/2010  . Cystic thyroid nodule     left  . HTN (hypertension)   . Heterozygous factor V Leiden mutation 12/2010  . Pancreatitis 05/2011    EUS Dr Christella Hartigan 06/09/11-> dilated main pancreatic duct in HOP, 2-3 filling defects distal pancreatic duct, peripancreatic fluid resolved  . C. difficile colitis 01/2011?  Marland Kitchen Arthritis   . Clotting disorder   . GERD (gastroesophageal reflux disease)   . Chills   . Fever   . Weight loss   . Hearing loss   . Nasal congestion   . Leg swelling   . Palpitations   . Abdominal distension   . Abdominal pain   . Nausea & vomiting   . Rectal pain   . Difficulty urinating   . Arthritis pain   . Headache(784.0)   . Weakness   . Bruises easily   . Anemia   .  Dizziness   . Numbness and tingling     both hands, and both legs and feet  . Joint pain   . Blood in urine     hx of, none current  . Frequent urination at night   . Pneumonia as child  . Abnormal EKG     hx left bundle branch block on ekg  . Sleep apnea     STOPBANG=5  . Heterozygous factor V Leiden mutation   . Prostatitis, chronic   . Pancreatitis   . Pancreatitis 12/2011    . Arthritis     lower back. Dr. Charlynne CousinsAlabanza in [place]  . GERD (gastroesophageal reflux disease)   . HTN (hypertension) 01/28/2014  . BPH (benign prostatic hyperplasia) 01/28/2014  . Factor V Leiden 07/19/2013  . 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  [facility] on his problem list.     is allergic to reglan.  [name] had no medications administered during this visit.  Past Surgical History  Procedure Laterality Date  . Neck surgery  1999    C5/C6 fusion, limited neck mobility especially turning to left  . Knee surgery  1980's    arthroscopy, left knee  . Femur fracture surgery  age 46    right leg  . Nerve surgery  1980's    right thumb  . Colonoscopy  01/2011    Dr. Zenon MayoBrian Beachum at [facility]-->normal  . Esophagogastroduodenoscopy  01/2011    Dr. Zenon MayoBrian Beachum at [facility]-->bile reflux in stomach-->patient c/o inadequate conscious sedation (Fentanyl 100mg /Versed 8mg )  . Ercp  07/2011    Dr. Logan [facility]-> pancreatic enterotomy, pancreatic duct stones removed, bile duct not manipulated, no stents  . Cholecystectomy  01/25/2012    Procedure: LAPAROSCOPIC CHOLECYSTECTOMY WITH INTRAOPERATIVE CHOLANGIOGRAM;  Surgeon: Robyne AskewPaul S Toth III, MD;  Location: [facility];  Service: General;  Laterality: N/A;  . Cystoscopy with hydrodistension and biopsy  01/25/2012    Procedure: CYSTOSCOPY/BIOPSY/HYDRODISTENSION;  Surgeon: Anner CreteJohn J Wrenn, MD;  Location: [facility];  Service: Urology;  Laterality: N/A;   Cystoscopy and Hydrodistension of the Bladder  . 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 Esophagogastroduodenoscopy (egd) with propofol N/A 02/13/2014    UJW:[id number]:Incomplete EGD as described above s/p bx (mild changes of reflux on esophageal bx  . Esophageal biopsy N/A 02/13/2014    Procedure: BIOPSY;  Surgeon: Corbin Adeobert M Rourk, MD;  Location: [facility];  Service: Endoscopy;  Laterality: N/A;  . Esophagogastroduodenoscopy (egd) with propofol N/A 04/10/2014    Dr. Jena Gaussourk: patulous EG junction, s/p [address] dilation empirically  . Esophageal dilation N/A 04/10/2014    Procedure: ESOPHAGEAL DILATION;  Surgeon: Corbin Adeobert M Rourk, MD;  Location: [facility];  Service: Endoscopy;  Laterality: N/A;  [place] 54,   . Cardiac catheterization  10/15/14    Denies any headaches, dizziness, double vision, fevers, chills, night sweats, nausea, vomiting, diarrhea, constipation, chest pain, heart palpitations, shortness of breath, blood in stool, black tarry stool, urinary pain, urinary burning, urinary  frequency, hematuria.   PHYSICAL EXAMINATION  ECOG PERFORMANCE STATUS: 1 - Symptomatic but completely ambulatory  Filed Vitals:   05/06/15 1404  BP: 150/79  Pulse: 76  Temp: 98.1 F (36.7 C)  Resp: 16    GENERAL:alert, no distress, well nourished, well developed, comfortable, cooperative and smiling SKIN: skin color, texture, turgor are normal, no rashes or significant lesions HEAD: Normocephalic, No masses, lesions, tenderness or abnormalities EYES: normal, PERRLA, EOMI, Conjunctiva are pink and non-injected EARS: External ears normal OROPHARYNX:lips, buccal mucosa, and tongue normal and mucous membranes are moist  NECK: supple, no adenopathy, thyroid normal size, non-tender, without nodularity, no stridor, non-tender, trachea midline LYMPH:  no palpable lymphadenopathy BREAST:not examined LUNGS: clear to  auscultation  HEART: regular rate & rhythm, no murmurs, no gallops, S1 normal and S2 normal ABDOMEN:abdomen soft, non-tender and normal bowel sounds BACK: Back symmetric, no curvature., No CVA tenderness EXTREMITIES:less then 2 second capillary refill, no joint deformities, effusion, or inflammation, no skin discoloration, no cyanosis, B/L LE edema, 2+ pitting.  No erythema or heat noted.  Negative Homan's sign. NEURO: alert & oriented x 3 with fluent speech, no focal motor/sensory deficits, gait normal   LABORATORY DATA: CBC    Component Value Date/Time   WBC 11.2* 05/06/2015 1400   RBC 4.61 05/06/2015 1400   HGB 13.1 05/06/2015 1400   HCT 39.5 05/06/2015 1400   HCT 42 11/08/2011 1452   PLT 376 05/06/2015 1400   MCV 85.7 05/06/2015 1400   MCV 85.8 11/08/2011 1452   MCH 28.4 05/06/2015 1400   MCHC 33.2 05/06/2015 1400   RDW 13.5 05/06/2015 1400   LYMPHSABS 3.0 05/06/2015 1400   MONOABS 0.5 05/06/2015 1400   EOSABS 0.2 05/06/2015 1400   BASOSABS 0.0 05/06/2015 1400      Chemistry      Component Value Date/Time   NA 138 05/06/2015 1400   NA 137 11/08/2011 1450   K 3.6 05/06/2015 1400   K 3.9 11/08/2011 1450   CL 105 05/06/2015 1400   CL 103 08/29/2011 1618   CO2 27 05/06/2015 1400   CO2 32 08/29/2011 1618   BUN 9 05/06/2015 1400   BUN 8 11/08/2011 1450   CREATININE 0.84 05/06/2015 1400   CREATININE 0.78 05/02/2013 1134      Component Value Date/Time   CALCIUM 9.0 05/06/2015 1400   CALCIUM 9.3 11/08/2011 1450   ALKPHOS 110 01/12/2015 1210   ALKPHOS 89 11/08/2011 1450   AST 24 01/12/2015 1210   AST 21 11/08/2011 1450   ALT 17 01/12/2015 1210   BILITOT 0.5 01/12/2015 1210   BILITOT 0.6 08/29/2011 1618     Lab Results  Component Value Date   FERRITIN 29 03/11/2015     ASSESSMENT AND PLAN:  Heterozygous factor V Leiden mutation Factor V Leiden heterozygosity on hypercoag panel on 07/22/2013 without any other abnormalities. According to his history, in March  2012 he had a mild bilateral pulmonary embolism with a history of recurrent DVT and pulmonary emboli, therefore he will need lifelong anticoagulation.  Taking Xarelto 20 mg daily.  Compliance encouraged.   Iron deficiency noted in May 2016.  510 mg of IV feraheme was given on 03/19/2015.    Labs today: CBC diff, iron/TIBC, ferritin.  Return in 2 months for follow-up  BREACHED Narcotic pain contract with [facility].  NO MORE NARCOTICS FROM [place] Signed on 11/17/2014.  Urine drug screen today.  ADDENDUM: URINE DRUG SCREEN DONE TODAY.  TESTING POSITIVE FOR COCAINE.  THIS IS A BREACH OF PAIN CONTRACT.  DUE TO THIS, HE WILL NO LONGER RECEIVE ANY NARCOTIC FROM THE [facility].  PATIENT WILL BE INFORMED.  Drugs of Abuse     Component Value Date/Time   LABOPIA NONE DETECTED 05/06/2015 1438   COCAINSCRNUR POSITIVE* 05/06/2015 1438   LABBENZ POSITIVE* 05/06/2015 1438   AMPHETMU NONE DETECTED 05/06/2015 1438   THCU NONE DETECTED 05/06/2015 1438   LABBARB NONE DETECTED 05/06/2015 1438    Chronic abdominal pain Secondary to recurrent pancreatitis.  Not a hematologic issue.  On high dose narcotics for pain controlled, enabled by Dr. Zigmund Daniel.  Will provide a 2 month supply of Methadone and Oxycodone. Pain contract signed.      THERAPY PLAN:  We will continue with pain control.  If his pain becomes unmanageable with current pain medication regimen, referral to pain clinic would be in the patient's best interest.  Patient notified that I will not be adjusting his pain medications upwards.  All questions were answered. The patient knows to call the clinic with any problems, questions or concerns. We can certainly see the patient much sooner if necessary.  Patient and plan discussed with Dr. Loma Messing and she is in agreement with the aforementioned.   This note is electronically signed by: Dellis Anes 05/06/2015 5:31 PM

## 2015-05-07 ENCOUNTER — Encounter (HOSPITAL_COMMUNITY): Payer: Self-pay

## 2015-05-07 ENCOUNTER — Encounter (HOSPITAL_COMMUNITY): Payer: Self-pay | Admitting: Oncology

## 2015-05-11 ENCOUNTER — Other Ambulatory Visit (HOSPITAL_COMMUNITY): Payer: Medicare (Managed Care)

## 2015-05-11 ENCOUNTER — Ambulatory Visit (HOSPITAL_COMMUNITY): Payer: Medicare (Managed Care) | Admitting: Oncology

## 2015-05-29 ENCOUNTER — Encounter: Payer: Self-pay | Admitting: Gastroenterology

## 2015-05-29 ENCOUNTER — Ambulatory Visit: Payer: Medicare (Managed Care) | Admitting: Gastroenterology

## 2015-05-29 ENCOUNTER — Telehealth: Payer: Self-pay | Admitting: Gastroenterology

## 2015-05-29 NOTE — Telephone Encounter (Signed)
PATIENT WAS A NO SHOW AND LETTER SENT  °

## 2015-07-07 ENCOUNTER — Ambulatory Visit (HOSPITAL_COMMUNITY): Payer: Medicare (Managed Care) | Admitting: Oncology

## 2015-07-23 ENCOUNTER — Telehealth (HOSPITAL_COMMUNITY): Payer: Self-pay

## 2015-07-23 ENCOUNTER — Other Ambulatory Visit (HOSPITAL_COMMUNITY): Payer: Self-pay | Admitting: Oncology

## 2015-07-23 DIAGNOSIS — D6859 Other primary thrombophilia: Secondary | ICD-10-CM

## 2015-07-23 MED ORDER — RIVAROXABAN 20 MG PO TABS: 20.0000 mg | ORAL_TABLET | Freq: Every day | ORAL | Status: DC

## 2015-07-23 NOTE — Telephone Encounter (Signed)
Message left informing patient that Xarelto was refilled but no further refills will be given until follow-up appointment is scheduled.  Call back requested.

## 2015-07-27 ENCOUNTER — Encounter (HOSPITAL_COMMUNITY): Payer: Self-pay

## 2015-07-30 ENCOUNTER — Encounter (HOSPITAL_COMMUNITY): Payer: Self-pay | Admitting: Oncology

## 2015-08-03 ENCOUNTER — Ambulatory Visit (HOSPITAL_COMMUNITY): Payer: Medicare (Managed Care) | Admitting: Oncology

## 2015-08-04 ENCOUNTER — Ambulatory Visit (HOSPITAL_COMMUNITY): Payer: Medicare (Managed Care) | Admitting: Oncology

## 2015-08-06 ENCOUNTER — Encounter (HOSPITAL_COMMUNITY): Payer: Self-pay | Admitting: Oncology

## 2015-08-10 ENCOUNTER — Ambulatory Visit (HOSPITAL_COMMUNITY): Payer: Medicare (Managed Care) | Admitting: Oncology

## 2015-08-24 ENCOUNTER — Telehealth: Payer: Self-pay | Admitting: Gastroenterology

## 2015-08-24 ENCOUNTER — Ambulatory Visit: Payer: Medicare (Managed Care) | Admitting: Gastroenterology

## 2015-08-24 ENCOUNTER — Encounter: Payer: Self-pay | Admitting: Gastroenterology

## 2015-08-24 NOTE — Telephone Encounter (Signed)
PATIENT WAS A NO SHOW AND LETTER SENT  °

## 2015-08-27 ENCOUNTER — Encounter (HOSPITAL_COMMUNITY): Payer: Self-pay

## 2015-09-14 ENCOUNTER — Ambulatory Visit (INDEPENDENT_AMBULATORY_CARE_PROVIDER_SITE_OTHER): Payer: Medicare (Managed Care) | Admitting: Gastroenterology

## 2015-09-14 ENCOUNTER — Encounter: Payer: Self-pay | Admitting: Gastroenterology

## 2015-09-14 VITALS — BP 167/85 | HR 89 | Temp 96.8°F | Ht 72.0 in | Wt 203.6 lb

## 2015-09-14 DIAGNOSIS — K219 Gastro-esophageal reflux disease without esophagitis: Secondary | ICD-10-CM | POA: Diagnosis not present

## 2015-09-14 DIAGNOSIS — R1011 Right upper quadrant pain: Secondary | ICD-10-CM

## 2015-09-14 DIAGNOSIS — R1310 Dysphagia, unspecified: Secondary | ICD-10-CM | POA: Diagnosis not present

## 2015-09-14 DIAGNOSIS — K5900 Constipation, unspecified: Secondary | ICD-10-CM | POA: Diagnosis not present

## 2015-09-14 DIAGNOSIS — R110 Nausea: Secondary | ICD-10-CM

## 2015-09-14 DIAGNOSIS — R197 Diarrhea, unspecified: Secondary | ICD-10-CM

## 2015-09-14 MED ORDER — OXYCODONE HCL 10 MG PO TABS: 10.0000 mg | ORAL_TABLET | ORAL | Status: DC | PRN

## 2015-09-14 NOTE — Patient Instructions (Signed)
1. CT scan and labs as ordered. 2. If you have ongoing diarrhea, collect stool and take to lab. 3. If you develop constipation again, try Linzess once daily on empty stomach. Hold for diarrhea. Samples provided.  4. ONE TIME RX FOR OXYCODONE. 5. Dexilant samples provided today.

## 2015-09-14 NOTE — Progress Notes (Addendum)
Primary Care Physician: No primary care provider on file.  Primary Gastroenterologist:  Roetta Sessions, MD   Chief Complaint  Patient presents with  . Follow-up    alot of abd pain and nausea    HPI: Alex Hoffman is a 54 y.o. male here for follow up abdominal pain and nausea. H/o chronic pancreatitis, GERD, fatty liver, constipation, chronic abd pain. EGD/ED in June 2015 with empiric dilation. BPE July 2015 with focal narrowing of GE junction, obstruction of a 12.5 mm tablet. Attempted to have patient return for a visit to discuss repeat EGD/ED; however, he has had multiple health issues in the interim. Seen at [facility] 07/2014 for second opinion secondary to chronic abd pain. Was supposed to have manometry for dysphagia but hasn't had it done yet.   States he is due for f/u at [facility] next month. States nauseated all the time. C/o right abdominal pain radiates into groin, stays swollen. Last week he felt like his pancreatitis was back. Regarding swallowing, he has to think to swallow. Creates a lot of anxiety. Difficulty swallowing solids, liquids, pills. Living off of soups, soft foods. Cannot eat meat. Heartburn bad. Takes dexilant every day but out for one week. Works as good as nexium did. ruq pain, sharp and constant. BM once daily or every other day but stools loose. Takes dulcolax though. Previous did not tolerate amitiza due to bad headache. Never tried linzess or movantik. No melena, brbpr. Has had some diarrhea for a few days, no recent antibiotics.    Oxycodone takes just at night.  December  15th pain clinic. [facility].  Cardiac cath 09/2014 with mild non-obstructive ASCAD.   Current Outpatient Prescriptions  Medication Sig Dispense Refill  . Bisacodyl (DULCOLAX PO) Take 1 tablet by mouth as needed.    . carvedilol (COREG) 25 MG tablet Take 25 mg by mouth 2 (two) times daily.    Marland Kitchen dexlansoprazole (DEXILANT) 60 MG capsule Take 60 mg by mouth daily.    . diazepam (VALIUM) 5  MG tablet Take 1 tablet (5 mg total) by mouth every 6 (six) hours as needed for anxiety. 40 tablet 1  . furosemide (LASIX) 40 MG tablet Take 40 mg by mouth.    Marland Kitchen ibuprofen (ADVIL,MOTRIN) 200 MG tablet Take 400 mg by mouth 2 (two) times daily as needed for moderate pain.    Marland Kitchen lisinopril (ZESTRIL) 10 MG tablet Take 10 mg by mouth daily.    . methadone (DOLOPHINE) 10 MG tablet Take 5 tablets (50 mg total) by mouth every 8 (eight) hours. 450 tablet 0  . oxycodone (ROXICODONE) 30 MG immediate release tablet Take 1 tablet every 4-6 hours as needed for pain 120 tablet 0  . phenazopyridine (PYRIDIUM) 100 MG tablet Take 100 mg by mouth 2 (two) times daily as needed for pain.     Marland Kitchen prochlorperazine (COMPAZINE) 10 MG tablet Take 0.5-1 tablets (5-10 mg total) by mouth 3 (three) times daily. 60 tablet 3  . rivaroxaban (XARELTO) 20 MG TABS tablet Take 1 tablet (20 mg total) by mouth daily with supper. 30 tablet 1  . silodosin (RAPAFLO) 8 MG CAPS capsule Take 1 capsule (8 mg total) by mouth daily with breakfast. 30 capsule 3  . tiZANidine (ZANAFLEX) 4 MG capsule Take 1 capsule up to 4 times a day for spasms. 120 capsule 3  . zolpidem (AMBIEN) 5 MG tablet Take 1 tablet (5 mg total) by mouth at bedtime as needed for sleep. 30 tablet  1   No current facility-administered medications for this visit.    Allergies as of 09/14/2015 - Review Complete 09/14/2015  Allergen Reaction Noted  . Reglan [metoclopramide]  08/09/2013   Past Medical History  Diagnosis Date  . Pulmonary embolism (HCC) 12/2010  . Cystic thyroid nodule     left  . HTN (hypertension)   . Heterozygous factor V Leiden mutation (HCC) 12/2010  . Pancreatitis 05/2011    EUS Dr Christella Hartigan 06/09/11-> dilated main pancreatic duct in HOP, 2-3 filling defects distal pancreatic duct, peripancreatic fluid resolved  . C. difficile colitis 01/2011?  Marland Kitchen Arthritis   . Clotting disorder (HCC)   . GERD (gastroesophageal reflux disease)   . Chills   . Fever   .  Weight loss   . Hearing loss   . Nasal congestion   . Leg swelling   . Palpitations   . Abdominal distension   . Abdominal pain   . Nausea & vomiting   . Rectal pain   . Difficulty urinating   . Arthritis pain   . Headache(784.0)   . Weakness   . Bruises easily   . Anemia   . Dizziness   . Numbness and tingling     both hands, and both legs and feet  . Joint pain   . Blood in urine     hx of, none current  . Frequent urination at night   . Pneumonia as child  . Abnormal EKG     hx left bundle branch block on ekg  . Sleep apnea     STOPBANG=5  . Heterozygous factor V Leiden mutation (HCC)   . Prostatitis, chronic   . Pancreatitis   . Pancreatitis 12/2011  . Arthritis     lower back. Dr. Charlynne Cousins in [place]  . GERD (gastroesophageal reflux disease)   . HTN (hypertension) 01/28/2014  . BPH (benign prostatic hyperplasia) 01/28/2014  . Factor V Leiden (HCC) 07/19/2013  . Narcotic pain contract exists with [facility] 11/17/2014    Signed on 11/17/2014   Past Surgical History  Procedure Laterality Date  . Neck surgery  1999    C5/C6 fusion, limited neck mobility especially turning to left  . Knee surgery  1980's    arthroscopy, left knee  . Femur fracture surgery  age 25    right leg  . Nerve surgery  1980's    right thumb  . Colonoscopy  01/2011    Dr. Zenon Mayo at [facility]-->normal  . Esophagogastroduodenoscopy  01/2011    Dr. Zenon Mayo at [facility]-->bile reflux in stomach-->patient c/o inadequate conscious sedation (Fentanyl /Versed )  . Ercp  07/2011    Dr. Logan Bores [facility]-> pancreatic enterotomy, pancreatic duct stones removed, bile duct not manipulated, no stents  . Cholecystectomy  01/25/2012    Procedure: LAPAROSCOPIC CHOLECYSTECTOMY WITH INTRAOPERATIVE CHOLANGIOGRAM;  Surgeon: Robyne Askew, MD;  Location: [facility];  Service: General;  Laterality: N/A;  . Cystoscopy with hydrodistension and biopsy  01/25/2012    Procedure:  CYSTOSCOPY/BIOPSY/HYDRODISTENSION;  Surgeon: Anner Crete, MD;  Location: [facility];  Service: Urology;  Laterality: N/A;  Cystoscopy and Hydrodistension of the Bladder  . 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 Esophagogastroduodenoscopy (egd) with propofol N/A 02/13/2014    [id number] EGD as described above s/p bx (mild changes of reflux on esophageal bx  . Esophageal biopsy N/A 02/13/2014  Procedure: BIOPSY;  Surgeon: Corbin Ade, MD;  Location: [facility];  Service: Endoscopy;  Laterality: N/A;  . Esophagogastroduodenoscopy (egd) with propofol N/A 04/10/2014    Dr. Jena Gauss: patulous EG junction, s/p 53 F Maloney dilation empirically  . Esophageal dilation N/A 04/10/2014    ZOX:[id number] EG junction otherwise normal EGD  . Cardiac catheterization  10/15/14   Family History  Problem Relation Age of Onset  . GI problems Brother   . Colon cancer Neg Hx   . Liver disease Neg Hx   . GI problems      stomach and lung cancer, aunts/uncles  . Heart attack Brother     before age of 36  . Stroke Brother   . Hypertension Mother   . Hypertension Father   . Cancer Maternal Aunt     colon/pancreas/lung/stomach  . Heart disease Maternal Uncle   . Cancer Maternal Grandfather     lung   Social History   Social History  . Marital Status: Married    Spouse Name: N/A  . Number of Children: 2  . Years of Education: N/A   Occupational History  .      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 Smoking status: Former Smoker -- 1.00 packs/day for 35 years    Types: Cigarettes    Quit date: 12/21/2012  . Smokeless tobacco: Never Used     Comment: About 2 cigarettes per day.   . Alcohol Use: No     Comment: No alcohol since January 27, 2011  . Drug Use: No  . Sexual Activity: Not Asked   Other Topics Concern  . None     Social History Narrative     ROS:  General: Negative for anorexia, weight loss, fever, chills, fatigue, weakness. ENT: Negative for hoarseness,  nasal congestion.see hpi CV: Negative for chest pain, angina, palpitations, dyspnea on exertion, peripheral edema.  Respiratory: Negative for dyspnea at rest, dyspnea on exertion, cough, sputum, wheezing.  GI: See history of present illness. GU:  Negative for dysuria, hematuria, urinary incontinence, urinary frequency, nocturnal urination.  Endo: Negative for unusual weight change.    Physical Examination:   BP 167/85 mmHg  Pulse 89  Temp(Src) 96.8 F (36 C) (Oral)  Ht 6' (1.829 m)  Wt 203 lb 9.6 oz (92.352 kg)  BMI 27.61 kg/m2  General: Well-nourished, well-developed in no acute distress.  Eyes: No icterus. Mouth: Oropharyngeal mucosa moist and pink , no lesions erythema or exudate. Lungs: Clear to auscultation bilaterally.  Heart: Regular rate and rhythm, no murmurs rubs or gallops.  Abdomen: Bowel sounds are normal, diffuse tenderness, worse in ruq with subjective guarding.  no hepatosplenomegaly or masses, no abdominal bruits or hernia , no rebound.   Extremities: No lower extremity edema. No clubbing or deformities. Neuro: Alert and oriented x 4   Skin: Warm and dry, no jaundice.   Psych: Alert and cooperative, normal mood and affect.  Labs:  Lab Results  Component Value Date   WBC 11.2* 05/06/2015   HGB 13.1 05/06/2015   HCT 39.5 05/06/2015   MCV 85.7 05/06/2015   PLT 376 05/06/2015   Lab Results  Component Value Date   CREATININE 0.84 05/06/2015   BUN 9 05/06/2015   NA 138 05/06/2015   K 3.6 05/06/2015   CL 105 05/06/2015   CO2 27 05/06/2015   Lab Results  Component Value  Date   ALT 17 01/12/2015   AST 24 01/12/2015   ALKPHOS 110 01/12/2015   BILITOT 0.5 01/12/2015   Lab Results  Component Value Date   IRON 68 05/06/2015   TIBC 368 05/06/2015   FERRITIN 92 05/06/2015    Imaging Studies: No  results found.  Impression/Plan: 54 y/o male with multiple chronic GI problems as outlined above who present with acute on chronic abdominal pain, ruq associated with nausea, chronic constipation in setting of narcotics, chronic dysphagia.   Patient with significant tenderness on exam today. Plan on CTA/P for further evaluation and labs. Patient with several days of diarrhea, plan on stool studies if persistent. If constipation returns, trial of Linzess.   For dysphagia, patient reports follow up at [facility]. Last EGD/ED 03/2014. Follow up BPE 04/2014 suggested stricture, patient has not had follow EGD and may benefit from further dilation. Await CT findings first.   Patient given one time RX for oxycodone.

## 2015-09-16 ENCOUNTER — Ambulatory Visit (HOSPITAL_COMMUNITY): Admission: RE | Admit: 2015-09-16 | Payer: Medicare (Managed Care) | Source: Ambulatory Visit

## 2015-09-17 ENCOUNTER — Ambulatory Visit (HOSPITAL_COMMUNITY)
Admission: RE | Admit: 2015-09-17 | Discharge: 2015-09-17 | Disposition: A | Discharge status: Home / Self Care | Payer: Medicare (Managed Care) | Source: Ambulatory Visit | Attending: Gastroenterology | Admitting: Gastroenterology

## 2015-09-17 DIAGNOSIS — G8929 Other chronic pain: Secondary | ICD-10-CM | POA: Diagnosis not present

## 2015-09-17 DIAGNOSIS — R197 Diarrhea, unspecified: Secondary | ICD-10-CM

## 2015-09-17 DIAGNOSIS — K5900 Constipation, unspecified: Secondary | ICD-10-CM

## 2015-09-17 DIAGNOSIS — Z9049 Acquired absence of other specified parts of digestive tract: Secondary | ICD-10-CM | POA: Insufficient documentation

## 2015-09-17 DIAGNOSIS — K219 Gastro-esophageal reflux disease without esophagitis: Secondary | ICD-10-CM

## 2015-09-17 DIAGNOSIS — R1011 Right upper quadrant pain: Secondary | ICD-10-CM | POA: Diagnosis not present

## 2015-09-17 DIAGNOSIS — R1310 Dysphagia, unspecified: Secondary | ICD-10-CM

## 2015-09-17 DIAGNOSIS — R110 Nausea: Secondary | ICD-10-CM

## 2015-09-17 MED ORDER — IOHEXOL 300 MG/ML  SOLN
100.0000 mL | Freq: Once | INTRAMUSCULAR | Status: AC | PRN
Start: 2015-09-17 — End: 2015-09-17
  Administered 2015-09-17: 100 mL via INTRAVENOUS

## 2015-09-20 NOTE — Progress Notes (Signed)
Quick Note:  Please let patient know his CT is unremarkable.   I recommend EGD in OR (due to chronic narcotics) with RMR to evaluate upper abd pain and esophageal dysphagia.   Hold Xarelto 2 days before procedure.  HE WILL HAVE TO BE FREE OF ILLEGAL DRUGS TO HAVE SEDATION. ______

## 2015-09-21 NOTE — Progress Notes (Signed)
NO PCP PER PT 

## 2015-09-23 ENCOUNTER — Other Ambulatory Visit: Payer: Self-pay

## 2015-09-28 ENCOUNTER — Other Ambulatory Visit: Payer: Self-pay | Admitting: Gastroenterology

## 2015-09-28 ENCOUNTER — Encounter: Payer: Self-pay | Admitting: Gastroenterology

## 2015-09-28 ENCOUNTER — Other Ambulatory Visit: Payer: Self-pay

## 2015-09-28 NOTE — Telephone Encounter (Signed)
Noted and pt has already sent my chart email to provider

## 2015-09-28 NOTE — Telephone Encounter (Signed)
Please notify the patient that per LSL note, one-time Rx for pain medicine was given. She is out of the office until Wednesday. I am unable to provide a refill because I did not Rx it. If he would like, a phone note could be sent to her for her review when she returns.

## 2015-09-29 ENCOUNTER — Telehealth: Payer: Self-pay | Admitting: Internal Medicine

## 2015-09-29 NOTE — Telephone Encounter (Signed)
Please call the patient and/or pharmacy. As documented in the refill request yesterday, LSL note says "one time Rx for Oxycodone." I cannot refill this for him. If he would like to address it further, will have to wait for LSL to come back tomorrow and a phone note can be sent with the request.

## 2015-09-29 NOTE — Telephone Encounter (Signed)
Pt called to see if LSL had left him a Rx up front. I told him nothing was up front for him and LSL hasn't been here the past 2 days and will be back tomorrow. Per JL patient was wanting another prescription of pain medication. I told patient he would have to wait until LSL returns to the office. Please call [phone]

## 2015-09-29 NOTE — Telephone Encounter (Signed)
There are two mychart emails from this pt in LSL box. Routing to LSL.

## 2015-09-30 ENCOUNTER — Encounter: Payer: Self-pay | Admitting: Gastroenterology

## 2015-09-30 MED ORDER — OXYCODONE HCL 10 MG PO TABS: 10.0000 mg | ORAL_TABLET | ORAL | Status: DC | PRN

## 2015-09-30 NOTE — Telephone Encounter (Signed)
Please let patient know that I have reviewed all three of his mychart emails. Sorry for delay but I have been out with sick children.   One RX for oxycodone 10mg  four times daily as needed, #60 and no refills. He will have to make it last until his OV with pain management. No more pain medications from us because we don't prescribe chronic pain medications.

## 2015-09-30 NOTE — Addendum Note (Signed)
Addended by: Tiffany KocherLEWIS, Tammi Boulier S on: 09/30/2015 03:28 PM   Modules accepted: Orders

## 2015-09-30 NOTE — Patient Instructions (Signed)
20    Your procedure is scheduled on: 10/05/2015  Report to [facility] at  6:20   AM.  Call this number if you have problems the morning of surgery: [phone]   Remember:   Do not drink or eat food:After Midnight.    Clear liquids include soda, tea, black coffee, apple or grape juice, broth.  Take these medicines the morning of surgery with A SIP OF WATER: Coreg, Dexilant, Valium and Methadone   Do not wear jewelry, make-up or nail polish.  Do not wear lotions, powders, or perfumes. You may wear deodorant.  Do not shave 48 hours prior to surgery. Men may shave face and neck.  Do not bring valuables to the hospital.  Contacts, dentures or bridgework may not be worn into surgery.  Leave suitcase in the car. After surgery it may be brought to your room.  For patients admitted to the hospital, checkout time is 11:00 AM the day of discharge.   Patients discharged the day of surgery will not be allowed to drive home.  Name and phone number of your driver:    Please read over the following fact sheets that you were given: Pain Booklet, Lab Information and Anesthesia Post-op Instructions   Endoscopy Care After Please read the instructions outlined below and refer to this sheet in the next few weeks. These discharge instructions provide you with general information on caring for yourself after you leave the hospital. Your doctor may also give you specific instructions. While your treatment has been planned according to the most current medical practices available, unavoidable complications occasionally occur. If you have any problems or questions after discharge, please call your doctor. HOME CARE INSTRUCTIONS Activity  You may resume your regular activity but move at a slower pace for the next 24 hours.   Take frequent rest periods for the next 24 hours.   Walking will help expel (get rid of) the air and reduce the bloated feeling in your abdomen.   No driving for 24 hours (because of  the anesthesia (medicine) used during the test).   You may shower.   Do not sign any important legal documents or operate any machinery for 24 hours (because of the anesthesia used during the test).  Nutrition  Drink plenty of fluids.   You may resume your normal diet.   Begin with a light meal and progress to your normal diet.   Avoid alcoholic beverages for 24 hours or as instructed by your caregiver.  Medications You may resume your normal medications unless your caregiver tells you otherwise. What you can expect today  You may experience abdominal discomfort such as a feeling of fullness or "gas" pains.   You may experience a sore throat for 2 to 3 days. This is normal. Gargling with salt water may help this.  Follow-up Your doctor will discuss the results of your test with you. SEEK IMMEDIATE MEDICAL CARE IF:  You have excessive nausea (feeling sick to your stomach) and/or vomiting.   You have severe abdominal pain and distention (swelling).   You have trouble swallowing.   You have a temperature over 100 F (37.8 C).   You have rectal bleeding or vomiting of blood.  Document Released: 05/31/2004 Document Revised: 10/06/2011 Document Reviewed: 12/12/2007

## 2015-09-30 NOTE — Telephone Encounter (Signed)
Pt is aware. rx at the front desk for him to pick up. Informed him that we would not be giving him any more pain meds.

## 2015-09-30 NOTE — Progress Notes (Signed)
Called office to verify obtain consent order.  Spoke with Candy [place].  I asked if we needed to add Esophageal dilation to the consent.  She stated it was only an EGD and the dysphagia would be followed up at [facility].

## 2015-10-01 ENCOUNTER — Encounter: Payer: Self-pay | Admitting: Gastroenterology

## 2015-10-01 ENCOUNTER — Encounter (HOSPITAL_COMMUNITY)
Admission: RE | Admit: 2015-10-01 | Discharge: 2015-10-01 | Disposition: A | Discharge status: Home / Self Care | Payer: Medicare (Managed Care) | Source: Ambulatory Visit | Attending: Internal Medicine | Admitting: Internal Medicine

## 2015-10-02 NOTE — Telephone Encounter (Signed)
Called pt multiple times and Left multiple message on numbers on Epic. Called Endo and spoke with [place].  Pt procedure has been cancelled.

## 2015-10-05 ENCOUNTER — Ambulatory Visit (HOSPITAL_COMMUNITY)
Admission: RE | Admit: 2015-10-05 | Payer: Medicare (Managed Care) | Source: Ambulatory Visit | Admitting: Internal Medicine

## 2015-10-05 ENCOUNTER — Encounter (HOSPITAL_COMMUNITY): Admission: RE | Payer: Self-pay | Source: Ambulatory Visit

## 2015-10-05 SURGERY — ESOPHAGOGASTRODUODENOSCOPY (EGD) WITH PROPOFOL
Anesthesia: Monitor Anesthesia Care

## 2016-01-22 ENCOUNTER — Encounter: Payer: Self-pay | Admitting: Gastroenterology

## 2016-02-02 ENCOUNTER — Ambulatory Visit: Payer: Medicare (Managed Care) | Admitting: Gastroenterology

## 2016-02-26 ENCOUNTER — Ambulatory Visit (INDEPENDENT_AMBULATORY_CARE_PROVIDER_SITE_OTHER): Payer: Medicare Other | Admitting: Gastroenterology

## 2016-02-26 ENCOUNTER — Encounter: Payer: Self-pay | Admitting: Gastroenterology

## 2016-02-26 ENCOUNTER — Other Ambulatory Visit: Payer: Self-pay

## 2016-02-26 VITALS — BP 130/80 | HR 59 | Temp 97.1°F | Ht 72.0 in | Wt 218.4 lb

## 2016-02-26 DIAGNOSIS — K219 Gastro-esophageal reflux disease without esophagitis: Secondary | ICD-10-CM | POA: Diagnosis not present

## 2016-02-26 DIAGNOSIS — G8929 Other chronic pain: Secondary | ICD-10-CM

## 2016-02-26 DIAGNOSIS — R1310 Dysphagia, unspecified: Secondary | ICD-10-CM

## 2016-02-26 DIAGNOSIS — R1011 Right upper quadrant pain: Secondary | ICD-10-CM

## 2016-02-26 DIAGNOSIS — R109 Unspecified abdominal pain: Secondary | ICD-10-CM

## 2016-02-26 MED ORDER — PANTOPRAZOLE SODIUM 40 MG PO TBEC: 40.0000 mg | DELAYED_RELEASE_TABLET | Freq: Every day | ORAL | Status: DC

## 2016-02-26 NOTE — Progress Notes (Signed)
Primary Care Physician:  [name], MD  Primary Gastroenterologist:  Roetta Sessions, MD   Chief Complaint  Patient presents with  . set up EGD    HPI:  Alex Hoffman is a 55 y.o. male here for follow-up. Last seen in November 2016. Noncompliance with upper endoscopy previously recommended. History of chronic abdominal pain and nausea. History of pancreatitis (EUS 06/2011 with no evidence of chronic pancreatitis but had pancreatic duct stone requiring Subsequent ERCP with pancreatic duct stone removal in September 2012 and cholecystectomy 2013, GERD, fatty liver, constipation.EGD/ED in June 2015 with empiric dilation. BPE July 2015 with focal narrowing of GE junction, obstruction of a 12.5 mm tablet. Attempted to have patient return for a visit to discuss repeat EGD/ED; however, he has had multiple health issues in the interim. Seen at [facility] 07/2014 for second opinion secondary to chronic abd pain. Was supposed to have manometry for dysphagia but hasn't had it done yet.   Patient reports ongoing chronic right upper quadrant/epigastric pain which radiates down to the right midabdomen. Describes pain as constant. Worse with movement, especially when laying down and trying to get up. Feels very bloated in the right upper abdomen. Denies heartburn. Continues to have dysphagia. When he tries to swallow he gets severe hiccups. Creates a lot of anxiety. Bowel movements currently regular. Uses Dulcolax more than Linzess. Occasional constipation. No blood in the stool. Stools are "kind of blackish".  Patient tells me that he has been discharged from his newest pain clinic. He has been through 2 already per his report. He states that they called him to come in for a same day pill count that he was out of town refused to come. Of note, patient has repeatedly requested pain medications from our practice but we have continued to decline. He did not request any for me personally today but after he spoke with  our scheduler he asked her for pain medication. Previously was receiving pain medication during the hematology clinic however he tested positive for cocaine in July 2016, so he was breech of his narcotic contract.  Patient requesting cheaper PPI, Dexilant to expensive.  CT abdomen and pelvis with contrast on 09/17/2015 unremarkable.  Current Outpatient Prescriptions  Medication Sig Dispense Refill  . carvedilol (COREG) 25 MG tablet Take 25 mg by mouth 2 (two) times daily.    Marland Kitchen dexlansoprazole (DEXILANT) 60 MG capsule Take 60 mg by mouth daily.    . diazepam (VALIUM) 5 MG tablet Take 1 tablet (5 mg total) by mouth every 6 (six) hours as needed for anxiety. 40 tablet 1  . furosemide (LASIX) 40 MG tablet Take 40 mg by mouth 3 (three) times daily.     Marland Kitchen ibuprofen (ADVIL,MOTRIN) 200 MG tablet Take 400 mg by mouth 2 (two) times daily as needed for moderate pain.    . Linaclotide (LINZESS) 290 MCG CAPS capsule Take 290 mcg by mouth daily.    . methadone (DOLOPHINE) 10 MG tablet Take 5 tablets (50 mg total) by mouth every 8 (eight) hours. 450 tablet 0  . Oxycodone HCl 10 MG TABS Take 1 tablet (10 mg total) by mouth every 4 (four) hours as needed. 60 tablet 0  . phenazopyridine (PYRIDIUM) 100 MG tablet Take 100 mg by mouth 2 (two) times daily as needed for pain.     Marland Kitchen prochlorperazine (COMPAZINE) 10 MG tablet Take 0.5-1 tablets (5-10 mg total) by mouth 3 (three) times daily. 60 tablet 3  . rivaroxaban (XARELTO) 20 MG TABS tablet  Take 1 tablet (20 mg total) by mouth daily with supper. 30 tablet 1  . silodosin (RAPAFLO) 8 MG CAPS capsule Take 1 capsule (8 mg total) by mouth daily with breakfast. 30 capsule 3  . tiZANidine (ZANAFLEX) 4 MG capsule Take 1 capsule up to 4 times a day for spasms. (Patient taking differently: Take 4 mg by mouth 4 (four) times daily as needed for muscle spasms. ) 120 capsule 3  . zolpidem (AMBIEN) 5 MG tablet Take 1 tablet (5 mg total) by mouth at bedtime as needed for sleep. 30  tablet 1  . lisinopril (ZESTRIL) 10 MG tablet Take 10 mg by mouth daily.     No current facility-administered medications for this visit.    Allergies as of 02/26/2016 - Review Complete 02/26/2016  Allergen Reaction Noted  . Reglan [metoclopramide]  08/09/2013    Past Medical History  Diagnosis Date  . Pulmonary embolism (HCC) 12/2010  . Cystic thyroid nodule     left  . HTN (hypertension)   . Heterozygous factor V Leiden mutation (HCC) 12/2010  . Pancreatitis 05/2011    EUS Dr Christella Hartigan 06/09/11-> dilated main pancreatic duct in HOP, 2-3 filling defects distal pancreatic duct, peripancreatic fluid resolved  . C. difficile colitis 01/2011?  Marland Kitchen Arthritis   . Clotting disorder (HCC)   . GERD (gastroesophageal reflux disease)   . Chills   . Fever   . Weight loss   . Hearing loss   . Nasal congestion   . Leg swelling   . Palpitations   . Abdominal distension   . Abdominal pain   . Nausea & vomiting   . Rectal pain   . Difficulty urinating   . Arthritis pain   . Headache(784.0)   . Weakness   . Bruises easily   . Anemia   . Dizziness   . Numbness and tingling     both hands, and both legs and feet  . Joint pain   . Blood in urine     hx of, none current  . Frequent urination at night   . Pneumonia as child  . Abnormal EKG     hx left bundle branch block on ekg  . Sleep apnea     STOPBANG=5  . Heterozygous factor V Leiden mutation (HCC)   . Prostatitis, chronic   . Pancreatitis   . Pancreatitis 12/2011  . Arthritis     lower back. Dr. Charlynne Cousins in [place]  . GERD (gastroesophageal reflux disease)   . HTN (hypertension) 01/28/2014  . BPH (benign prostatic hyperplasia) 01/28/2014  . Factor V Leiden (HCC) 07/19/2013  . Narcotic pain contract exists with [facility] 11/17/2014    Signed on 11/17/2014    Past Surgical History  Procedure Laterality Date  . Neck surgery  1999    C5/C6 fusion, limited neck mobility especially turning to left  . Knee surgery   1980's    arthroscopy, left knee  . Femur fracture surgery  age 80    right leg  . Nerve surgery  1980's    right thumb  . Colonoscopy  01/2011    Dr. Zenon Mayo at [facility]-->normal  . Esophagogastroduodenoscopy  01/2011    Dr. Zenon Mayo at [facility]-->bile reflux in stomach-->patient c/o inadequate conscious sedation (Fentanyl /Versed )  . Ercp  07/2011    Dr. Logan Bores [facility]-> pancreatic enterotomy, pancreatic duct stones removed, bile duct not manipulated, no stents  . Cholecystectomy  01/25/2012    Procedure: LAPAROSCOPIC  CHOLECYSTECTOMY WITH INTRAOPERATIVE CHOLANGIOGRAM;  Surgeon: Robyne Askew, MD;  Location: [facility];  Service: General;  Laterality: N/A;  . Cystoscopy with hydrodistension and biopsy  01/25/2012    Procedure: CYSTOSCOPY/BIOPSY/HYDRODISTENSION;  Surgeon: Anner Crete, MD;  Location: [facility];  Service: Urology;  Laterality: N/A;  Cystoscopy and Hydrodistension of the Bladder  . 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 Esophagogastroduodenoscopy (egd) with propofol N/A 02/13/2014    [id number] EGD as described above s/p bx (mild changes of reflux on esophageal bx  . Biopsy N/A 02/13/2014    Procedure: BIOPSY;  Surgeon: Corbin Ade, MD;  Location: [facility];  Service: Endoscopy;  Laterality: N/A;  . Esophagogastroduodenoscopy (egd) with propofol N/A 04/10/2014    Dr. Jena Gauss: patulous EG junction, s/p 72 F Maloney dilation empirically  . Esophageal dilation N/A 04/10/2014    [id number] EG junction otherwise normal EGD  . Cardiac catheterization  10/15/14    Family History  Problem Relation Age of Onset  . GI problems Brother   . Colon cancer Neg Hx   . Liver disease Neg Hx   . GI problems      stomach and lung cancer, aunts/uncles  . Heart attack Brother     before age of 19  . Stroke Brother   . Hypertension Mother   . Hypertension Father   . Cancer  Maternal Aunt     colon/pancreas/lung/stomach  . Heart disease Maternal Uncle   . Cancer Maternal Grandfather     lung    Social History   Social History  . Marital Status: Married    Spouse Name: N/A  . Number of Children: 2  . Years of Education: N/A   Occupational History  .      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 Smoking status: Former Smoker -- 1.00 packs/day for 35 years    Types: Cigarettes    Quit date: 12/21/2012  . Smokeless tobacco: Never Used     Comment: About 2 cigarettes per day.   . Alcohol Use: No     Comment: No alcohol since January 27, 2011  . Drug Use: No  . Sexual Activity: Not on file   Other Topics Concern  . Not on file   Social History Narrative      ROS:  General: Negative for anorexia, weight loss, fever, chills, fatigue, weakness. Eyes: Negative for vision changes.  ENT: Negative for hoarseness, difficulty swallowing , nasal congestion. CV: Negative for chest pain, angina, palpitations, dyspnea on exertion, peripheral edema.  Respiratory: Negative for dyspnea at rest, dyspnea on exertion, cough, sputum, wheezing.  GI: See history of present illness. GU:  Negative for dysuria, hematuria, urinary incontinence, urinary frequency, nocturnal urination.  MS: Negative for joint pain, low back pain.  Derm: Negative for rash or itching.  Neuro: Negative for weakness, abnormal sensation, seizure, frequent headaches, memory loss, confusion.  Psych: Negative for anxiety, depression, suicidal ideation, hallucinations.  Endo: Negative for unusual weight change.  Heme: Negative for bruising or bleeding. Allergy: Negative for rash or hives.    Physical Examination:  BP 130/80 mmHg  Pulse 59  Temp(Src) 97.1 F (36.2 C)  Ht 6' (1.829 m)  Wt 218 lb 6.4 oz (99.066 kg)  BMI 29.61 kg/m2   General: Well-nourished,  well-developed in no acute distress.  Head:  Normocephalic, atraumatic.   Eyes: Conjunctiva pink, no icterus. Mouth: Oropharyngeal mucosa moist and pink , no lesions erythema or exudate. Neck: Supple without thyromegaly, masses, or lymphadenopathy.  Lungs: Clear to auscultation bilaterally.  Heart: Regular rate and rhythm, no murmurs rubs or gallops.  Abdomen: Bowel sounds are normal, diffuse tenderness with mild palpation, worse with straight leg raise.  nondistended, no hepatosplenomegaly or masses, no abdominal bruits or    hernia , no rebound or guarding.   Rectal: Not performed Extremities: No lower extremity edema. No clubbing or deformities.  Neuro: Alert and oriented x 4 , grossly normal neurologically.  Skin: Warm and dry, no rash or jaundice.   Psych: Alert and cooperative, normal mood and affect.

## 2016-02-26 NOTE — Patient Instructions (Addendum)
1. Upper endoscopy with Dr. Jena Gaussourk. See separate instructions.  2. Samples of Dexilant provided to take one daily. We have sent in pantoprazole to see if cheaper.

## 2016-03-02 ENCOUNTER — Telehealth: Payer: Self-pay | Admitting: Gastroenterology

## 2016-03-02 NOTE — Telephone Encounter (Signed)
Please note, patient needs to take his last dose of Xarelto on 03/19/2016 at 6pm, no Xarelto on 05/21, further instructions regarding Xarelto to be provided after his EGD on 03/21/2016 by Dr. Jena Gaussourk

## 2016-03-02 NOTE — Progress Notes (Signed)
cc'ed to pcp °

## 2016-03-02 NOTE — Telephone Encounter (Signed)
Pt called and states that his pain management doctor is moving out of the country and is requesting a pain management referral. Please advise.

## 2016-03-02 NOTE — Assessment & Plan Note (Addendum)
55 year old gentleman who presents with ongoing epigastric/right upper quadrant abdominal pain, esophageal dysphagia with abnormal barium pill esophagram July 2015, chronic GERD, history of pancreatitis. Suspect abdominal pain multifactorial, cannot exclude abdominal wall pain as continuing factor. Patient has exhibited drug-seeking behavior. Presents to schedule EGD with dilation as previously recommended on numerous occasions in the past. Plan for procedure in the OR due to polypharmacy, chronic narcotics. Hold Xarelto for 36 hours prior to procedure.  I have discussed the risks, alternatives, benefits with regards to but not limited to the risk of reaction to medication, bleeding, infection, perforation and the patient is agreeable to proceed. Written consent to be obtained.  Switch from Dexilant to pantoprazole 40 mg daily.

## 2016-03-03 NOTE — Telephone Encounter (Signed)
Pt is aware of hold Xaralto.

## 2016-03-03 NOTE — Telephone Encounter (Signed)
Called pt and LMOM to call office back  

## 2016-03-03 NOTE — Telephone Encounter (Signed)
Pt is aware.  

## 2016-03-03 NOTE — Telephone Encounter (Signed)
He needs to get pain management referral from his PCP.

## 2016-03-11 NOTE — Patient Instructions (Addendum)
Rodell PernaDavid L Yaden  03/11/2016     @PREFPERIOPPHARMACY @   Your procedure is scheduled on 03/21/2016.  [facility]  Call this number if you have problems the morning of surgery:  [phone]   Remember:  Do not eat food or drink liquids after midnight.  Take these medicines the morning of surgery with A SIP OF WATER : NORVASC, VALIUM, PROTONIX AND METHADONE   Do not wear jewelry, make-up or nail polish.  Do not wear lotions, powders, or perfumes.  You may wear deodorant.  Do not shave 48 hours prior to surgery.  Men may shave face and neck.  Do not bring valuables to the hospital.  [facility] not [facility] for any belongings or valuables.  Contacts, dentures or bridgework may not be worn into surgery.  Leave your suitcase in the car.  After surgery it may be brought to your room.  For patients admitted to the hospital, discharge time will be determined by your treatment team.  Patients discharged the day of surgery will not be allowed to drive home.   Name and phone number of your driver:   FAMILY Special instructions:  N/A  Please read over the following fact sheets that you were given. Care and Recovery After Surgery    Esophagogastroduodenoscopy Esophagogastroduodenoscopy (EGD) is a procedure that is used to examine the lining of the esophagus, stomach, and first part of the small intestine (duodenum). A long, flexible, lighted tube with a camera attached (endoscope) is inserted down the throat to view these organs. This procedure is done to detect problems or abnormalities, such as inflammation, bleeding, ulcers, or growths, in order to treat them. The procedure lasts 5-20 minutes. It is usually an outpatient procedure, but it may need to be performed in a hospital in emergency cases. LET [facility] PROVIDER KNOW ABOUT:  Any allergies you have.  All medicines you are taking, including vitamins, herbs, eye drops, creams, and over-the-counter  medicines.  Previous problems you or members of your family have had with the use of anesthetics.  Any blood disorders you have.  Previous surgeries you have had.  Medical conditions you have. RISKS AND COMPLICATIONS Generally, this is a safe procedure. However, problems can occur and include:  Infection.  Bleeding.  Tearing (perforation) of the esophagus, stomach, or duodenum.  Difficulty breathing or not being able to breathe.  Excessive sweating.  Spasms of the larynx.  Slowed heartbeat.  Low blood pressure. BEFORE THE PROCEDURE  Do not eat or drink anything after midnight on the night before the procedure or as directed by your health care provider.  Do not take your regular medicines before the procedure if your health care provider asks you not to. Ask your health care provider about changing or stopping those medicines.  If you wear dentures, be prepared to remove them before the procedure.  Arrange for someone to drive you home after the procedure. PROCEDURE  A numbing medicine (local anesthetic) may be sprayed in your throat for comfort and to stop you from gagging or coughing.  You will have an IV tube inserted in a vein in your hand or arm. You will receive medicines and fluids through this tube.  You will be given a medicine to relax you (sedative).  A pain reliever will be given through the IV tube.  A mouth guard may be placed in your mouth to protect your teeth and to keep you from biting on the  endoscope.  You will be asked to lie on your left side.  The endoscope will be inserted down your throat and into your esophagus, stomach, and duodenum.  Air will be put through the endoscope to allow your health care provider to clearly view the lining of your esophagus.  The lining of your esophagus, stomach, and duodenum will be examined. During the exam, your health care provider may:  Remove tissue to be examined under a microscope (biopsy) for  inflammation, infection, or other medical problems.  Remove growths.  Remove objects (foreign bodies) that are stuck.  Treat any bleeding with medicines or other devices that stop tissues from bleeding (hot cautery, clipping devices).  Widen (dilate) or stretch narrowed areas of your esophagus and stomach.  The endoscope will be withdrawn. AFTER THE PROCEDURE  You will be taken to a recovery area for observation. Your blood pressure, heart rate, breathing rate, and blood oxygen level will be monitored often until the medicines you were given have worn off.  Do not eat or drink anything until the numbing medicine has worn off and your gag reflex has returned. You may choke.  Your health care provider should be able to discuss his or her findings with you. It will take longer to discuss the test results if any biopsies were taken.   This information is not intended to replace advice given to you by your health care provider. Make sure you discuss any questions you have with your health care provider.   Document Released: 02/17/2005 Document Revised: 11/07/2014 Document Reviewed: 09/19/2012 Elsevier Interactive Patient Education [employer]

## 2016-03-16 ENCOUNTER — Encounter (HOSPITAL_COMMUNITY)
Admission: RE | Admit: 2016-03-16 | Discharge: 2016-03-16 | Disposition: A | Discharge status: Home / Self Care | Payer: Medicare Other | Source: Ambulatory Visit | Attending: Internal Medicine | Admitting: Internal Medicine

## 2016-03-16 ENCOUNTER — Encounter (HOSPITAL_COMMUNITY): Payer: Self-pay

## 2016-03-16 ENCOUNTER — Other Ambulatory Visit: Payer: Self-pay

## 2016-03-16 DIAGNOSIS — Z01812 Encounter for preprocedural laboratory examination: Secondary | ICD-10-CM | POA: Insufficient documentation

## 2016-03-16 DIAGNOSIS — R1310 Dysphagia, unspecified: Secondary | ICD-10-CM | POA: Insufficient documentation

## 2016-03-16 DIAGNOSIS — K219 Gastro-esophageal reflux disease without esophagitis: Secondary | ICD-10-CM | POA: Insufficient documentation

## 2016-03-16 DIAGNOSIS — Z01818 Encounter for other preprocedural examination: Secondary | ICD-10-CM | POA: Insufficient documentation

## 2016-03-16 DIAGNOSIS — R9431 Abnormal electrocardiogram [ECG] [EKG]: Secondary | ICD-10-CM | POA: Insufficient documentation

## 2016-03-16 LAB — CBC WITH DIFFERENTIAL/PLATELET
Basophils Absolute: 0 10*3/uL (ref 0.0–0.1)
Basophils Relative: 0 %
EOS ABS: 0.4 10*3/uL (ref 0.0–0.7)
EOS PCT: 4 %
HCT: 41.3 % (ref 39.0–52.0)
Hemoglobin: 14.1 g/dL (ref 13.0–17.0)
LYMPHS ABS: 3.2 10*3/uL (ref 0.7–4.0)
Lymphocytes Relative: 32 %
MCH: 29.3 pg (ref 26.0–34.0)
MCHC: 34.1 g/dL (ref 30.0–36.0)
MCV: 85.7 fL (ref 78.0–100.0)
MONOS PCT: 5 %
Monocytes Absolute: 0.5 10*3/uL (ref 0.1–1.0)
Neutro Abs: 5.9 10*3/uL (ref 1.7–7.7)
Neutrophils Relative %: 59 %
PLATELETS: 282 10*3/uL (ref 150–400)
RBC: 4.82 MIL/uL (ref 4.22–5.81)
RDW: 12.6 % (ref 11.5–15.5)
WBC: 9.9 10*3/uL (ref 4.0–10.5)

## 2016-03-16 LAB — BASIC METABOLIC PANEL
Anion gap: 7 (ref 5–15)
BUN: 11 mg/dL (ref 6–20)
CO2: 26 mmol/L (ref 22–32)
CREATININE: 1 mg/dL (ref 0.61–1.24)
Calcium: 9 mg/dL (ref 8.9–10.3)
Chloride: 101 mmol/L (ref 101–111)
GFR calc Af Amer: >60 mL/min (ref >60)
Glucose, Bld: 96 mg/dL (ref 65–99)
Potassium: 4.3 mmol/L (ref 3.5–5.1)
SODIUM: 134 mmol/L — AB (ref 135–145)

## 2016-03-21 ENCOUNTER — Ambulatory Visit (HOSPITAL_COMMUNITY)
Admission: RE | Admit: 2016-03-21 | Discharge: 2016-03-21 | Disposition: A | Discharge status: Home / Self Care | Payer: Medicare Other | Source: Ambulatory Visit | Attending: Internal Medicine | Admitting: Internal Medicine

## 2016-03-21 ENCOUNTER — Encounter (HOSPITAL_COMMUNITY): Payer: Self-pay | Admitting: *Deleted

## 2016-03-21 ENCOUNTER — Encounter (HOSPITAL_COMMUNITY)
Admission: RE | Disposition: A | Discharge status: Home / Self Care | Payer: Self-pay | Source: Ambulatory Visit | Attending: Internal Medicine

## 2016-03-21 ENCOUNTER — Ambulatory Visit (HOSPITAL_COMMUNITY): Payer: Medicare Other | Admitting: Anesthesiology

## 2016-03-21 DIAGNOSIS — Z538 Procedure and treatment not carried out for other reasons: Secondary | ICD-10-CM | POA: Insufficient documentation

## 2016-03-21 DIAGNOSIS — K219 Gastro-esophageal reflux disease without esophagitis: Secondary | ICD-10-CM | POA: Insufficient documentation

## 2016-03-21 DIAGNOSIS — R1310 Dysphagia, unspecified: Secondary | ICD-10-CM | POA: Diagnosis not present

## 2016-03-21 LAB — RAPID URINE DRUG SCREEN, HOSP PERFORMED
AMPHETAMINES: NOT DETECTED
Barbiturates: NOT DETECTED
Benzodiazepines: POSITIVE — AB
COCAINE: POSITIVE — AB
OPIATES: NOT DETECTED
TETRAHYDROCANNABINOL: NOT DETECTED

## 2016-03-21 SURGERY — ESOPHAGOGASTRODUODENOSCOPY (EGD) WITH PROPOFOL
Anesthesia: Monitor Anesthesia Care

## 2016-03-21 MED ORDER — PROPOFOL 10 MG/ML IV BOLUS
INTRAVENOUS | Status: AC
  Filled 2016-03-21: qty 40

## 2016-03-21 MED ORDER — LIDOCAINE VISCOUS 2 % MT SOLN: 15.0000 mL | Freq: Once | OROMUCOSAL | Status: DC

## 2016-03-21 MED ORDER — MIDAZOLAM HCL 2 MG/2ML IJ SOLN
INTRAMUSCULAR | Status: AC
  Filled 2016-03-21: qty 2

## 2016-03-21 NOTE — OR Nursing (Signed)
Patient drug screen positive for cocaine , case cancelled by Dr. Jayme CloudGonzalez and Dr. Jena Gaussourk.

## 2016-03-21 NOTE — Anesthesia Preprocedure Evaluation (Signed)
Anesthesia Evaluation  Patient identified by MRN, date of birth, ID band Patient awake    Reviewed: Allergy & Precautions, H&P , NPO status , Patient's Chart, lab work & pertinent test results  History of Anesthesia Complications Negative for: history of anesthetic complications  Airway Mallampati: II  TM Distance: >3 FB     Dental  (+) Teeth Intact, Poor Dentition, Dental Advisory Given   Pulmonary neg pulmonary ROS, sleep apnea , pneumonia, resolved, Current Smoker, former smoker, PE (Factor V leiden mutation - Xaralto changed to Coumadin which was stopped 3 days ago.)   breath sounds clear to auscultation       Cardiovascular hypertension, Pt. on medications + Peripheral Vascular Disease   Rhythm:Regular Rate:Normal     Neuro/Psych  Headaches,    GI/Hepatic GERD  Medicated and Controlled,Pancreatitis   Endo/Other    Renal/GU      Musculoskeletal   Abdominal   Peds  Hematology   Anesthesia Other Findings   Reproductive/Obstetrics                             Anesthesia Physical Anesthesia Plan  ASA: III  Anesthesia Plan: MAC   Post-op Pain Management:    Induction: Intravenous  Airway Management Planned: Simple Face Mask  Additional Equipment:   Intra-op Plan:   Post-operative Plan:   Informed Consent: I have reviewed the patients History and Physical, chart, labs and discussed the procedure including the risks, benefits and alternatives for the proposed anesthesia with the patient or authorized representative who has indicated his/her understanding and acceptance.     Plan Discussed with:   Anesthesia Plan Comments:         Anesthesia Quick Evaluation

## 2016-04-12 DIAGNOSIS — G894 Chronic pain syndrome: Secondary | ICD-10-CM | POA: Diagnosis not present

## 2016-04-19 ENCOUNTER — Encounter: Payer: Self-pay | Admitting: Internal Medicine

## 2016-05-26 ENCOUNTER — Telehealth: Payer: Self-pay | Admitting: Nurse Practitioner

## 2016-05-26 ENCOUNTER — Ambulatory Visit: Payer: Medicare Other | Admitting: Nurse Practitioner

## 2016-05-26 NOTE — Telephone Encounter (Signed)
Letter mailed

## 2016-05-26 NOTE — Telephone Encounter (Signed)
Pt was a no show

## 2016-05-26 NOTE — Telephone Encounter (Signed)
Noted  

## 2016-08-23 ENCOUNTER — Encounter: Payer: Self-pay | Admitting: Gastroenterology

## 2016-08-23 ENCOUNTER — Ambulatory Visit (INDEPENDENT_AMBULATORY_CARE_PROVIDER_SITE_OTHER): Payer: Medicare Other | Admitting: Gastroenterology

## 2016-08-23 VITALS — BP 154/93 | HR 79 | Temp 97.6°F | Ht 72.0 in | Wt 206.6 lb

## 2016-08-23 DIAGNOSIS — R1310 Dysphagia, unspecified: Secondary | ICD-10-CM

## 2016-08-23 DIAGNOSIS — R1012 Left upper quadrant pain: Secondary | ICD-10-CM | POA: Diagnosis not present

## 2016-08-23 LAB — COMPLETE METABOLIC PANEL WITH GFR
ALT: 132 U/L — ABNORMAL HIGH (ref 9–46)
AST: 271 U/L — AB (ref 10–35)
Albumin: 4 g/dL (ref 3.6–5.1)
Alkaline Phosphatase: 177 U/L — ABNORMAL HIGH (ref 40–115)
BILIRUBIN TOTAL: 3.3 mg/dL — AB (ref 0.2–1.2)
BUN: 7 mg/dL (ref 7–25)
CHLORIDE: 97 mmol/L — AB (ref 98–110)
CO2: 26 mmol/L (ref 20–31)
CREATININE: 0.72 mg/dL (ref 0.70–1.33)
Calcium: 9.2 mg/dL (ref 8.6–10.3)
GFR, Est African American: >89 mL/min (ref >=60)
GFR, Est Non African American: >89 mL/min (ref >=60)
Glucose, Bld: 97 mg/dL (ref 65–99)
Potassium: 4.2 mmol/L (ref 3.5–5.3)
Sodium: 134 mmol/L — ABNORMAL LOW (ref 135–146)
TOTAL PROTEIN: 7.2 g/dL (ref 6.1–8.1)

## 2016-08-23 LAB — LIPASE: Lipase: 134 U/L — ABNORMAL HIGH (ref 7–60)

## 2016-08-23 MED ORDER — PANTOPRAZOLE SODIUM 40 MG PO TBEC: 40.0000 mg | DELAYED_RELEASE_TABLET | Freq: Every day | ORAL | 3 refills | Status: AC

## 2016-08-23 MED ORDER — NALOXEGOL OXALATE 25 MG PO TABS: 25.0000 mg | ORAL_TABLET | Freq: Every day | ORAL | 0 refills | Status: DC

## 2016-08-23 MED ORDER — PROMETHAZINE HCL 25 MG PO TABS: ORAL_TABLET | ORAL | 1 refills | Status: DC

## 2016-08-23 NOTE — Progress Notes (Signed)
Referring Provider: [name], MD Primary Care Physician:  No PCP Per Patient  Primary GI: Dr. Jena Gauss   Chief Complaint  Patient presents with  . Abdominal Pain    x 1 week, left abd swollen (feels like knot)  . Nausea  . Constipation    no BM since Thur or Fri last week, took laxative yesterday    HPI:   Alex Hoffman is a 55 y.o. male presenting today with a history of chronic abdominal pain, nausea, history of pancreatitis (with EUS in Aug 2012 with no evidence of chronic pancreatitis but had pancreatic duct stone requiring Subsequent ERCP with pancreatic duct stone removal in September 2012 and cholecystectomy 2013), GERD, fatty liver, constipation. EGD/dilation in June 2015 with empiric dilation. BPE July 2015 with focal narrowing of GE junction, obstruction of tablet. He was seen at [facility] in Oct 2015 for a second opinion secondary to chronic abdominal pain; he was supposed to have a manometry for dysphagia but did not complete. He has been seen at multiple pain clinics. Concern for drug-seeking behaviors in the past through our office. He was scheduled for an EGD with dilation in May 2017 for persistent epigastric/RUQ pain/dysphagia but did not complete this. HE HAS CANCELLED on several prior occassions.   For constipation: has not done well with Amitiza in the past. No BM since last Thursday. States that any other medications such as Linzess, Amitiza makes him "nervous" and he can't sleep.   Worsening pain in LUQ on Thursday after going to [employer]. Tried to eat Mac n Cheese and potatoes yesterday and it got worse but tolerated broth. Symptoms started after [employer]. Significant nausea. Out of compazine for nausea. Taking omeprazole over the counter instead of Dexilant, as he ran out of this. Phenergan has been helpful for nausea in the past.   Has peripheral neuropathy. Lower extremity pain/foot pain chronically. Oncologist in [place] has referred to Pain Management in  [place], but he has not had an appt yet. Last pain management doctor retired.   Still with solid food dysphagia.   Past Medical History:  Diagnosis Date  . Abdominal distension   . Abdominal pain   . Abnormal EKG    hx left bundle branch block on ekg  . Anemia   . Arthritis   . Arthritis    lower back. Dr. Charlynne Cousins in [place]  . Arthritis pain   . Blood in urine    hx of, none current  . BPH (benign prostatic hyperplasia) 01/28/2014  . Bruises easily   . C. difficile colitis 01/2011?  Marland Kitchen Chills   . Clotting disorder (HCC)   . Cystic thyroid nodule    left  . Difficulty urinating   . Dizziness   . Factor V Leiden (HCC) 07/19/2013  . Fever   . Frequent urination at night   . GERD (gastroesophageal reflux disease)   . GERD (gastroesophageal reflux disease)   . Headache(784.0)   . Hearing loss   . Heterozygous factor V Leiden mutation (HCC) 12/2010  . Heterozygous factor V Leiden mutation (HCC)   . HTN (hypertension)   . HTN (hypertension) 01/28/2014  . Joint pain   . Leg swelling   . Narcotic pain contract exists with [facility] 11/17/2014   Signed on 11/17/2014  . Nasal congestion   . Nausea & vomiting   . Numbness and tingling    both hands, and both legs and feet  . Palpitations   . Pancreatitis  05/2011   EUS Dr Christella HartiganJacobs 06/09/11-> dilated main pancreatic duct in HOP, 2-3 filling defects distal pancreatic duct, peripancreatic fluid resolved  . Pancreatitis   . Pancreatitis 12/2011  . Pneumonia as child  . Prostatitis, chronic   . Pulmonary embolism (HCC) 12/2010  . Rectal pain   . Sleep apnea    STOPBANG=5  . Weakness   . Weight loss     Past Surgical History:  Procedure Laterality Date  . BIOPSY N/A 02/13/2014   Procedure: BIOPSY;  Surgeon: Corbin Adeobert M Rourk, MD;  Location: [facility];  Service: Endoscopy;  Laterality: N/A;  . CARDIAC CATHETERIZATION  10/15/14  . CHOLECYSTECTOMY  01/25/2012   Procedure: LAPAROSCOPIC CHOLECYSTECTOMY WITH INTRAOPERATIVE  CHOLANGIOGRAM;  Surgeon: Robyne AskewPaul S Toth III, MD;  Location: [facility];  Service: General;  Laterality: N/A;  . COLONOSCOPY  01/2011   Dr. Zenon MayoBrian Beachum at [facility]-->normal  . 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 CYSTOSCOPY WITH HYDRODISTENSION AND BIOPSY  01/25/2012   Procedure: CYSTOSCOPY/BIOPSY/HYDRODISTENSION;  Surgeon: Anner CreteJohn J Wrenn, MD;  Location: [facility];  Service: Urology;  Laterality: N/A;  Cystoscopy and Hydrodistension of the Bladder  . ERCP  07/2011   Dr. Logan BoresEvans [facility]-> pancreatic enterotomy, pancreatic duct stones removed, bile duct not manipulated, no stents  . ESOPHAGEAL DILATION N/A 04/10/2014   MVH:QIONGEXBRMR:patulous EG junction otherwise normal EGD  . ESOPHAGOGASTRODUODENOSCOPY  01/2011   Dr. Zenon MayoBrian Beachum at [facility]-->bile reflux in stomach-->patient c/o inadequate conscious sedation (Fentanyl 100mg /Versed 8mg )  . ESOPHAGOGASTRODUODENOSCOPY (EGD) WITH PROPOFOL N/A 02/13/2014   MWU:[id number]:Incomplete EGD as described above s/p bx (mild changes of reflux on esophageal bx  . ESOPHAGOGASTRODUODENOSCOPY (EGD) WITH PROPOFOL N/A 04/10/2014   Dr. Jena Gaussourk: patulous EG junction, s/p [address] dilation empirically  . FEMUR FRACTURE SURGERY  age 25   right leg  . KNEE SURGERY  1980's   arthroscopy, left knee  . NECK SURGERY  1999   C5/C6 fusion, limited neck mobility especially turning to left  . NERVE SURGERY  1980's   right thumb    Current Outpatient Prescriptions  Medication Sig Dispense Refill  . amLODipine (NORVASC) 10 MG tablet Take 10 mg by mouth daily.    . diazepam (VALIUM) 5 MG tablet Take 1 tablet (5 mg total) by mouth every 6 (six) hours as needed for anxiety. 40 tablet 1  . furosemide (LASIX) 40 MG tablet Take 40 mg by mouth daily as needed for fluid.     Marland Kitchen. ibuprofen (ADVIL,MOTRIN) 200 MG tablet Take 400 mg by mouth 2 (two) times daily as needed for moderate pain.    . methadone (DOLOPHINE) 10 MG  tablet Take 5 tablets (50 mg total) by mouth every 8 (eight) hours. 450 tablet 0  . Multiple Vitamin (MULTIVITAMIN WITH MINERALS) TABS tablet Take 1 tablet by mouth daily.    Marland Kitchen. oxycodone (ROXICODONE) 30 MG immediate release tablet Take 30 mg by mouth every 4 (four) hours as needed for pain.    . Oxycodone HCl 10 MG TABS Take 1 tablet (10 mg total) by mouth every 4 (four) hours as needed. (Patient not taking: Reported on 03/10/2016) 60 tablet 0  . pantoprazole (PROTONIX) 40 MG tablet Take 1 tablet (40 mg total) by mouth daily before breakfast. 90 tablet 3  . phenazopyridine (PYRIDIUM) 100 MG tablet Take 100 mg by mouth 2 (two) times daily as needed for pain.     .Marland Kitchen  prochlorperazine (COMPAZINE) 10 MG tablet Take 0.5-1 tablets (5-10 mg total) by mouth 3 (three) times daily. 60 tablet 3  . rivaroxaban (XARELTO) 20 MG TABS tablet Take 1 tablet (20 mg total) by mouth daily with supper. 30 tablet 1  . tiZANidine (ZANAFLEX) 4 MG capsule Take 1 capsule up to 4 times a day for spasms. (Patient taking differently: Take 4 mg by mouth 4 (four) times daily as needed for muscle spasms. ) 120 capsule 3  . zolpidem (AMBIEN) 5 MG tablet Take 1 tablet (5 mg total) by mouth at bedtime as needed for sleep. 30 tablet 1   No current facility-administered medications for this visit.     Allergies as of 08/23/2016 - Review Complete 08/23/2016  Allergen Reaction Noted  . Reglan [metoclopramide]  08/09/2013    Family History  Problem Relation Age of Onset  . GI problems Brother   . Colon cancer Neg Hx   . Liver disease Neg Hx   . GI problems      stomach and lung cancer, aunts/uncles  . Heart attack Brother     before age of 1  . Stroke Brother   . Hypertension Mother   . Hypertension Father   . Cancer Maternal Aunt     colon/pancreas/lung/stomach  . Heart disease Maternal Uncle   . Cancer Maternal Grandfather     lung    Social History   Social History  . Marital status: Married    Spouse name: N/A    . Number of children: 2  . Years of education: N/A   Occupational History  .      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 Smoking status: Current Some Day Smoker    Packs/day: 1.00    Years: 35.00    Types: Cigarettes    Last attempt to quit: 12/21/2012  . Smokeless tobacco: Never Used     Comment: About 2 cigarettes per day.   . Alcohol use No     Comment: No alcohol since January 27, 2011  . Drug use: No     Comment: hx of cocaine use; none since last summer  . Sexual activity: Not Asked   Other Topics Concern  . None   Social History Narrative  . None    Review of Systems: As mentioned in HPI.   Physical Exam: BP (!) 154/93   Pulse 79   Temp 97.6 F (36.4 C) (Oral)   Ht 6' (1.829 m)   Wt 206 lb 9.6 oz (93.7 kg)   BMI 28.02 kg/m  General:   Alert and oriented. Appears uncomfortable.  Head:  Normocephalic and atraumatic. Eyes:  Conjuctiva clear without scleral icterus. Mouth:  Oral mucosa pink and moist.  Heart:  S1, S2 present without murmurs Abdomen:  +BS, soft, flinches prior to being touched, TTP diffusely  Msk:  Symmetrical without gross deformities. Normal posture. Extremities:  Without edema. Neurologic:  Alert and  oriented x4;  grossly normal neurologically. Psych:  Alert and cooperative. Normal mood and affect.

## 2016-08-23 NOTE — Patient Instructions (Addendum)
Please have blood work drawn today.   I have sent in a prescription for Protonix to take once each morning. I have also given you Dexilant samples to have on hand at home. Do not take these at the same time, but you could see if the Protonix works well for you. If this does, we can continue with this.   I have sent in Phenergan for nausea for you.   For constipation: start taking Movantik 1 tablet each day. I have provided samples.   We have scheduled you for an upper endoscopy with dilation with Dr. Jena Gaussourk. HOLD COUMADIN FOR 3 DAYS BEFORE THE PROCEDURE.

## 2016-08-24 ENCOUNTER — Encounter (HOSPITAL_COMMUNITY): Payer: Self-pay | Admitting: Emergency Medicine

## 2016-08-24 ENCOUNTER — Telehealth: Payer: Self-pay | Admitting: Internal Medicine

## 2016-08-24 ENCOUNTER — Emergency Department (HOSPITAL_COMMUNITY): Payer: Medicare Other

## 2016-08-24 ENCOUNTER — Inpatient Hospital Stay (HOSPITAL_COMMUNITY)
Admission: EM | Admit: 2016-08-24 | Discharge: 2016-08-26 | DRG: 439 | Disposition: A | Discharge status: Home / Self Care | Payer: Medicare Other | Attending: Internal Medicine | Admitting: Internal Medicine

## 2016-08-24 ENCOUNTER — Telehealth: Payer: Self-pay

## 2016-08-24 DIAGNOSIS — E876 Hypokalemia: Secondary | ICD-10-CM | POA: Diagnosis present

## 2016-08-24 DIAGNOSIS — K861 Other chronic pancreatitis: Secondary | ICD-10-CM | POA: Diagnosis present

## 2016-08-24 DIAGNOSIS — K219 Gastro-esophageal reflux disease without esophagitis: Secondary | ICD-10-CM | POA: Diagnosis present

## 2016-08-24 DIAGNOSIS — Z888 Allergy status to other drugs, medicaments and biological substances status: Secondary | ICD-10-CM

## 2016-08-24 DIAGNOSIS — F17210 Nicotine dependence, cigarettes, uncomplicated: Secondary | ICD-10-CM | POA: Diagnosis present

## 2016-08-24 DIAGNOSIS — Z79891 Long term (current) use of opiate analgesic: Secondary | ICD-10-CM

## 2016-08-24 DIAGNOSIS — R945 Abnormal results of liver function studies: Secondary | ICD-10-CM | POA: Diagnosis present

## 2016-08-24 DIAGNOSIS — Z9049 Acquired absence of other specified parts of digestive tract: Secondary | ICD-10-CM

## 2016-08-24 DIAGNOSIS — K5790 Diverticulosis of intestine, part unspecified, without perforation or abscess without bleeding: Secondary | ICD-10-CM | POA: Diagnosis present

## 2016-08-24 DIAGNOSIS — N400 Enlarged prostate without lower urinary tract symptoms: Secondary | ICD-10-CM | POA: Diagnosis present

## 2016-08-24 DIAGNOSIS — Z86711 Personal history of pulmonary embolism: Secondary | ICD-10-CM

## 2016-08-24 DIAGNOSIS — I10 Essential (primary) hypertension: Secondary | ICD-10-CM | POA: Diagnosis present

## 2016-08-24 DIAGNOSIS — D6851 Activated protein C resistance: Secondary | ICD-10-CM | POA: Diagnosis present

## 2016-08-24 DIAGNOSIS — M549 Dorsalgia, unspecified: Secondary | ICD-10-CM

## 2016-08-24 DIAGNOSIS — H9190 Unspecified hearing loss, unspecified ear: Secondary | ICD-10-CM | POA: Diagnosis present

## 2016-08-24 DIAGNOSIS — D689 Coagulation defect, unspecified: Secondary | ICD-10-CM | POA: Diagnosis present

## 2016-08-24 DIAGNOSIS — Z981 Arthrodesis status: Secondary | ICD-10-CM

## 2016-08-24 DIAGNOSIS — K859 Acute pancreatitis without necrosis or infection, unspecified: Secondary | ICD-10-CM | POA: Diagnosis not present

## 2016-08-24 DIAGNOSIS — Z87442 Personal history of urinary calculi: Secondary | ICD-10-CM

## 2016-08-24 DIAGNOSIS — Z7901 Long term (current) use of anticoagulants: Secondary | ICD-10-CM

## 2016-08-24 DIAGNOSIS — R109 Unspecified abdominal pain: Secondary | ICD-10-CM | POA: Diagnosis present

## 2016-08-24 DIAGNOSIS — R7989 Other specified abnormal findings of blood chemistry: Secondary | ICD-10-CM | POA: Diagnosis present

## 2016-08-24 DIAGNOSIS — R1012 Left upper quadrant pain: Secondary | ICD-10-CM | POA: Insufficient documentation

## 2016-08-24 DIAGNOSIS — G8929 Other chronic pain: Secondary | ICD-10-CM | POA: Diagnosis present

## 2016-08-24 DIAGNOSIS — Z79899 Other long term (current) drug therapy: Secondary | ICD-10-CM

## 2016-08-24 LAB — CBC WITH DIFFERENTIAL/PLATELET
Basophils Absolute: 0 10*3/uL (ref 0.0–0.1)
Basophils Relative: 0 %
EOS ABS: 0.3 10*3/uL (ref 0.0–0.7)
Eosinophils Relative: 2 %
HCT: 46.8 % (ref 39.0–52.0)
HEMOGLOBIN: 16.4 g/dL (ref 13.0–17.0)
LYMPHS ABS: 2.9 10*3/uL (ref 0.7–4.0)
Lymphocytes Relative: 26 %
MCH: 30.4 pg (ref 26.0–34.0)
MCHC: 35 g/dL (ref 30.0–36.0)
MCV: 86.8 fL (ref 78.0–100.0)
Monocytes Absolute: 0.6 10*3/uL (ref 0.1–1.0)
Monocytes Relative: 5 %
NEUTROS PCT: 67 %
Neutro Abs: 7.4 10*3/uL (ref 1.7–7.7)
Platelets: 324 10*3/uL (ref 150–400)
RBC: 5.39 MIL/uL (ref 4.22–5.81)
RDW: 12.9 % (ref 11.5–15.5)
WBC: 11.1 10*3/uL — AB (ref 4.0–10.5)

## 2016-08-24 LAB — COMPREHENSIVE METABOLIC PANEL
ALBUMIN: 4 g/dL (ref 3.5–5.0)
ALK PHOS: 176 U/L — AB (ref 38–126)
ALT: 135 U/L — AB (ref 17–63)
AST: 129 U/L — AB (ref 15–41)
Anion gap: 8 (ref 5–15)
BUN: 7 mg/dL (ref 6–20)
CHLORIDE: 97 mmol/L — AB (ref 101–111)
CO2: 27 mmol/L (ref 22–32)
Calcium: 9 mg/dL (ref 8.9–10.3)
Creatinine, Ser: 0.95 mg/dL (ref 0.61–1.24)
GFR calc non Af Amer: >60 mL/min (ref >60)
GLUCOSE: 131 mg/dL — AB (ref 65–99)
Potassium: 3.6 mmol/L (ref 3.5–5.1)
SODIUM: 132 mmol/L — AB (ref 135–145)
TOTAL PROTEIN: 8.2 g/dL — AB (ref 6.5–8.1)
Total Bilirubin: 1.2 mg/dL (ref 0.3–1.2)

## 2016-08-24 LAB — LIPASE, BLOOD: Lipase: 78 U/L — ABNORMAL HIGH (ref 11–51)

## 2016-08-24 LAB — PROTIME-INR
INR: 1.63
PROTHROMBIN TIME: 19.5 s — AB (ref 11.4–15.2)

## 2016-08-24 MED ORDER — FAMOTIDINE IN NACL 20-0.9 MG/50ML-% IV SOLN
20.0000 mg | Freq: Two times a day (BID) | INTRAVENOUS | Status: DC
Start: 2016-08-24 — End: 2016-08-26
  Administered 2016-08-24 – 2016-08-26 (×4): 20 mg via INTRAVENOUS
  Filled 2016-08-24 (×5): qty 50

## 2016-08-24 MED ORDER — AMLODIPINE BESYLATE 5 MG PO TABS
10.0000 mg | ORAL_TABLET | Freq: Every day | ORAL | Status: DC
  Administered 2016-08-25 – 2016-08-26 (×2): 10 mg via ORAL
  Filled 2016-08-24 (×3): qty 2

## 2016-08-24 MED ORDER — ONDANSETRON HCL 4 MG/2ML IJ SOLN
4.0000 mg | Freq: Four times a day (QID) | INTRAMUSCULAR | Status: DC | PRN
  Filled 2016-08-24: qty 2

## 2016-08-24 MED ORDER — HYDROMORPHONE HCL 1 MG/ML IJ SOLN
1.0000 mg | INTRAMUSCULAR | Status: DC | PRN
  Administered 2016-08-24 – 2016-08-25 (×6): 1 mg via INTRAVENOUS
  Filled 2016-08-24 (×6): qty 1

## 2016-08-24 MED ORDER — ZOLPIDEM TARTRATE 5 MG PO TABS
5.0000 mg | ORAL_TABLET | Freq: Every evening | ORAL | Status: DC | PRN
  Administered 2016-08-24 – 2016-08-25 (×2): 5 mg via ORAL
  Filled 2016-08-24 (×2): qty 1

## 2016-08-24 MED ORDER — MORPHINE SULFATE (PF) 4 MG/ML IV SOLN
4.0000 mg | INTRAVENOUS | Status: DC | PRN
  Administered 2016-08-24: 4 mg via INTRAVENOUS
  Filled 2016-08-24: qty 1

## 2016-08-24 MED ORDER — LORAZEPAM 2 MG/ML IJ SOLN
1.0000 mg | Freq: Once | INTRAMUSCULAR | Status: AC
  Administered 2016-08-24: 1 mg via INTRAVENOUS
  Filled 2016-08-24: qty 1

## 2016-08-24 MED ORDER — IOPAMIDOL (ISOVUE-300) INJECTION 61%
100.0000 mL | Freq: Once | INTRAVENOUS | Status: AC | PRN
  Administered 2016-08-24: 100 mL via INTRAVENOUS

## 2016-08-24 MED ORDER — OXYCODONE HCL 5 MG PO TABS
30.0000 mg | ORAL_TABLET | ORAL | Status: DC | PRN
  Administered 2016-08-24 – 2016-08-25 (×3): 30 mg via ORAL
  Filled 2016-08-24 (×3): qty 6

## 2016-08-24 MED ORDER — MORPHINE SULFATE (PF) 4 MG/ML IV SOLN
4.0000 mg | INTRAVENOUS | Status: AC | PRN
  Administered 2016-08-24 (×2): 4 mg via INTRAVENOUS
  Filled 2016-08-24 (×2): qty 1

## 2016-08-24 MED ORDER — PANTOPRAZOLE SODIUM 40 MG PO TBEC
40.0000 mg | DELAYED_RELEASE_TABLET | Freq: Every day | ORAL | Status: DC
  Administered 2016-08-25 – 2016-08-26 (×2): 40 mg via ORAL
  Filled 2016-08-24 (×2): qty 1

## 2016-08-24 MED ORDER — DIAZEPAM 5 MG PO TABS
5.0000 mg | ORAL_TABLET | Freq: Four times a day (QID) | ORAL | Status: DC | PRN
  Administered 2016-08-24 – 2016-08-26 (×5): 5 mg via ORAL
  Filled 2016-08-24 (×5): qty 1

## 2016-08-24 MED ORDER — ONDANSETRON HCL 4 MG/2ML IJ SOLN
4.0000 mg | INTRAMUSCULAR | Status: AC | PRN
  Administered 2016-08-24 (×2): 4 mg via INTRAVENOUS
  Filled 2016-08-24 (×2): qty 2

## 2016-08-24 MED ORDER — ONDANSETRON HCL 4 MG/2ML IJ SOLN
4.0000 mg | Freq: Four times a day (QID) | INTRAMUSCULAR | Status: DC | PRN
  Administered 2016-08-24: 4 mg via INTRAVENOUS
  Filled 2016-08-24: qty 2

## 2016-08-24 MED ORDER — IOPAMIDOL (ISOVUE-300) INJECTION 61%
INTRAVENOUS | Status: AC
  Filled 2016-08-24: qty 30

## 2016-08-24 MED ORDER — POTASSIUM CHLORIDE IN NACL 20-0.9 MEQ/L-% IV SOLN
INTRAVENOUS | Status: DC
  Administered 2016-08-24 – 2016-08-25 (×2): via INTRAVENOUS

## 2016-08-24 NOTE — Telephone Encounter (Signed)
PATIENT WIFE CALLED AND WANTED TO LET YOU KNOW THAT THEY ARE AT THE HOSPITAL WAITING TO BE ADMITTED.

## 2016-08-24 NOTE — ED Triage Notes (Signed)
Having abdominal pain since Thursday.  Pain to upper abdominal and pain is shooting.  Rates pain 10/10.  History of kidney stones.

## 2016-08-24 NOTE — Progress Notes (Signed)
cc'ed to pcp °

## 2016-08-24 NOTE — Progress Notes (Signed)
Labs reviewed. Lipase 134, Tbili 3.3, Alk Phos 177, AST 271, ALT 132. I contacted patient. He states pain is worse than yesterday and now notes that his urine is dark/tea-colored. He did not appear jaundiced to me yesterday in clinic. Concern for obstruction. Needs to go to the ED for further evaluation as soon as possible.  I contacted patient personally. He is waiting on his wife to pick him up, and he will go to [facility]. I told him to expect admission likely.

## 2016-08-24 NOTE — H&P (Signed)
History and Physical    VARTAN KERINS ZOX:[id number] DOB: 03/20/1961 DOA: 08/24/2016  PCP: No PCP Per Patient   Patient coming from: Home.  Chief Complaint: Abdominal pain.  HPI: Alex Hoffman is a 55 y.o. 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  Per patient, he has chronic abdominal pain. However, He has been having intense abdominal pain associated with moderate to severe nausea without emesis after eating lunch from [employer] on Thursday. He denies fever, chills, diarrhea, GU symptoms, but complains of frequent constipation due to his chronic use of narcotics for chronic pain. Per patient, apparently GI is planning to perform endoscopic studies soon, maybe on this admission and wants to hold anticoagulation first before doing it.   ED Course: The patient received IV fluids, IV analgesics and antiemetics. He states the he feels some relief from the medications.   Workup showed a lipase trending down from yesterday's 134 to 78 units today. AST was 129 and ALT was 135. They were 271 and 132 units respectively yesterday. WBC were 9.9, H&H was 14.1/41.3 yesterday and 11.1, H&H 16.4/ 46.8 when rechecked today.   Imaging: CT of the abdomen and pelvis with contrast was positive for mild diverticulosis without diverticulitis and did not show any acute findings.  Review of Systems: As per HPI otherwise 10 point review of systems negative.     Past Medical History:  Diagnosis Date  . Abdominal distension   . Abdominal pain   . Abnormal EKG    hx left bundle branch block on ekg  . Anemia   . Arthritis     . Arthritis    lower back. Dr. Charlynne Cousins in [place]  . Arthritis pain   . Blood in urine    hx of, none current  . BPH (benign prostatic hyperplasia) 01/28/2014  . Bruises easily   . C. difficile colitis 01/2011?  Marland Kitchen Chills   . Clotting disorder (HCC)   . Cystic thyroid nodule    left  . Difficulty urinating   . Dizziness   . Factor V Leiden (HCC) 07/19/2013  . Fever   . Frequent urination at night   . GERD (gastroesophageal reflux disease)   . GERD (gastroesophageal reflux disease)   . Headache(784.0)   . Hearing loss   . Heterozygous factor V Leiden mutation (HCC) 12/2010  . Heterozygous factor V Leiden mutation (HCC)   . HTN (hypertension)   . HTN (hypertension) 01/28/2014  . Joint pain   . Leg swelling   . Narcotic pain contract exists with [facility] 11/17/2014   Signed on 11/17/2014  . Nasal congestion   . Nausea & vomiting   . Numbness and tingling    both hands, and both legs and feet  . Palpitations   . Pancreatitis 05/2011   EUS Dr Christella Hartigan 06/09/11-> dilated main pancreatic duct in HOP, 2-3 filling defects distal pancreatic duct, peripancreatic fluid resolved  . Pancreatitis   . Pancreatitis 12/2011  . Pneumonia as child  . Prostatitis, chronic   . Pulmonary embolism (HCC) 12/2010  . Rectal pain   . Sleep apnea    STOPBANG=5  . Weakness   . Weight loss  Past Surgical History:  Procedure Laterality Date  . BIOPSY N/A 02/13/2014   Procedure: BIOPSY;  Surgeon: Corbin Ade, MD;  Location: [facility];  Service: Endoscopy;  Laterality: N/A;  . CARDIAC CATHETERIZATION  10/15/14  . CHOLECYSTECTOMY  01/25/2012   Procedure: LAPAROSCOPIC CHOLECYSTECTOMY WITH INTRAOPERATIVE CHOLANGIOGRAM;  Surgeon: Robyne Askew, MD;  Location: [facility];  Service: General;  Laterality: N/A;  . COLONOSCOPY  01/2011   Dr. Zenon Mayo at [facility]-->normal  . 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 CYSTOSCOPY WITH HYDRODISTENSION AND BIOPSY  01/25/2012   Procedure: CYSTOSCOPY/BIOPSY/HYDRODISTENSION;  Surgeon: Anner Crete, MD;  Location: [facility];  Service: Urology;  Laterality: N/A;  Cystoscopy and Hydrodistension of the Bladder  . ERCP  07/2011   Dr. Logan Bores [facility]-> pancreatic enterotomy, pancreatic duct stones removed, bile duct not manipulated, no stents  . ESOPHAGEAL DILATION N/A 04/10/2014   [id number] EG junction otherwise normal EGD  . ESOPHAGOGASTRODUODENOSCOPY  01/2011   Dr. Zenon Mayo at [facility]-->bile reflux in stomach-->patient c/o inadequate conscious sedation (Fentanyl 100mg /Versed 8mg )  . ESOPHAGOGASTRODUODENOSCOPY (EGD) WITH PROPOFOL N/A 02/13/2014   [id number] EGD as described above s/p bx (mild changes of reflux on esophageal bx  . ESOPHAGOGASTRODUODENOSCOPY (EGD) WITH PROPOFOL N/A 04/10/2014   Dr. Jena Gauss: patulous EG junction, s/p 21 F Maloney dilation empirically  . FEMUR FRACTURE SURGERY  age 65   right leg  . KNEE SURGERY  1980's   arthroscopy, left knee  . NECK SURGERY  1999   C5/C6 fusion, limited neck mobility especially turning to left  . NERVE SURGERY  1980's   right thumb     reports that he has been smoking Cigarettes.  He has a 35.00 pack-year smoking history. He has never used smokeless tobacco. He reports that he does not drink alcohol or use drugs.  Allergies  Allergen Reactions  . Reglan [Metoclopramide] Other (See Comments)    Muscle twitches.    Family History  Problem Relation Age of Onset  . Hypertension Mother   . Hypertension Father   . Cancer Maternal Aunt     colon/pancreas/lung/stomach  . GI problems Brother   . GI problems      stomach and lung cancer, aunts/uncles  . Heart attack Brother     before age of 14  . Stroke Brother   . Heart disease Maternal Uncle   . Cancer Maternal Grandfather     lung  . Colon cancer Neg Hx   . Liver disease Neg Hx      Prior to Admission medications   Medication  Sig Start Date End Date Taking? Authorizing Provider  amLODipine (NORVASC) 10 MG tablet Take 10 mg by mouth daily.   Yes Historical Provider, MD  amoxicillin (AMOXIL) 875 MG tablet Take 875 mg by mouth 2 (two) times daily. 7 day course starting on 08/18/2016 08/18/16  Yes Historical Provider, MD  dexlansoprazole (DEXILANT) 60 MG capsule Take 60 mg by mouth daily.   Yes Historical Provider, MD  diazepam (VALIUM) 5 MG tablet Take 1 tablet (5 mg total) by mouth every 6 (six) hours as needed for anxiety. 05/06/15  Yes Maurine Minister Kefalas, PA-C  furosemide (LASIX) 40 MG tablet Take 40 mg by mouth daily as needed for fluid.  01/19/15  Yes Historical Provider, MD  methadone (DOLOPHINE) 10 MG tablet Take 5 tablets (50 mg total) by mouth every 8 (eight) hours.  05/06/15  Yes Ellouise Newer, PA-C  Multiple Vitamin (MULTIVITAMIN WITH MINERALS) TABS tablet Take 1 tablet by mouth daily.   Yes Historical Provider, MD  oxycodone (ROXICODONE) 30 MG immediate release tablet Take 30 mg by mouth every 4 (four) hours as needed for pain.   Yes Historical Provider, MD  promethazine (PHENERGAN) 25 MG tablet Take 1/2 tablet to 1 tablet every 6 to 8 hours as needed for nausea. Patient taking differently: Take 25 mg by mouth every 6 (six) hours as needed for nausea or vomiting. every 6 to 8 hours as needed for nausea. 08/23/16  Yes Gelene Mink, NP  tiZANidine (ZANAFLEX) 4 MG capsule Take 1 capsule up to 4 times a day for spasms. Patient taking differently: Take 4 mg by mouth 4 (four) times daily as needed for muscle spasms.  06/03/14  Yes Alla German, MD  warfarin (COUMADIN) 5 MG tablet Take 5-7.5 mg by mouth daily. Takes 5MG  on Monday-Friday. Takes 7.5MG  on Saturday and Sunday   Yes Historical Provider, MD  zolpidem (AMBIEN) 5 MG tablet Take 1 tablet (5 mg total) by mouth at bedtime as needed for sleep. 01/05/15  Yes Maurine Minister Kefalas, PA-C  naloxegol oxalate (MOVANTIK) 25 MG TABS tablet Take 1 tablet (25 mg total) by mouth  daily. Patient not taking: Reported on 08/24/2016 08/23/16   Gelene Mink, NP  pantoprazole (PROTONIX) 40 MG tablet Take 1 tablet (40 mg total) by mouth daily. Patient not taking: Reported on 08/24/2016 08/23/16   Gelene Mink, NP    Physical Exam:  Constitutional: NAD, calm, comfortable Vitals:   08/24/16 1900 08/24/16 1930 08/24/16 1954 08/24/16 2000  BP: 155/93 153/95 153/95 130/89  Pulse: 80 77 77 78  Resp:  18 18 18   Temp:   98.3 F (36.8 C)   TempSrc:   Oral   SpO2: 98% 98% 100% 96%  Weight:      Height:       Eyes: PERRL, lids and conjunctivae normal ENMT: Mucous membranes are moist. Posterior pharynx clear of any exudate or lesions. Absent dentition.  Neck: normal, supple, no masses, no thyromegaly Respiratory: clear to auscultation bilaterally, no wheezing, no crackles. Normal respiratory effort. No accessory muscle use.  Cardiovascular: Regular rate and rhythm, no murmurs / rubs / gallops. No extremity edema. 2+ pedal pulses. No carotid bruits.  Abdomen: Bowel sounds positive. Positive epigastric tenderness, no guarding/rebound/masses palpated. No hepatosplenomegaly. Musculoskeletal: no clubbing / cyanosis. No joint deformity upper and lower extremities. Good ROM, no contractures. Normal muscle tone.  Skin: no rashes, lesions, ulcers. No induration Neurologic: CN 2-12 grossly intact. Sensation intact, DTR normal. Strength 5/5 in all 4.  Psychiatric: Normal judgment and insight. Alert and oriented x 3. Normal mood.    Labs on Admission: I have personally reviewed following labs and imaging studies  CBC:  Recent Labs Lab 08/24/16 1543  WBC 11.1*  NEUTROABS 7.4  HGB 16.4  HCT 46.8  MCV 86.8  PLT 324   Basic Metabolic Panel:  Recent Labs Lab 08/23/16 1242 08/24/16 1543  NA 134* 132*  K 4.2 3.6  CL 97* 97*  CO2 26 27  GLUCOSE 97 131*  BUN 7 7  CREATININE 0.72 0.95  CALCIUM 9.2 9.0   GFR: Estimated Creatinine Clearance: 102.5 mL/min (by C-G formula  based on SCr of 0.95 mg/dL). Liver Function Tests:  Recent Labs Lab 08/23/16 1242 08/24/16 1543  AST 271* 129*  ALT 132* 135*  ALKPHOS 177* 176*  BILITOT  3.3* 1.2  PROT 7.2 8.2*  ALBUMIN 4.0 4.0    Recent Labs Lab 08/23/16 1242 08/24/16 1543  LIPASE 134* 78*   No results for input(s): AMMONIA in the last 168 hours. Coagulation Profile:  Recent Labs Lab 08/24/16 1543  INR 1.63   Cardiac Enzymes: No results for input(s): CKTOTAL, CKMB, CKMBINDEX, TROPONINI in the last 168 hours. BNP (last 3 results) No results for input(s): PROBNP in the last 8760 hours. HbA1C: No results for input(s): HGBA1C in the last 72 hours. CBG: No results for input(s): GLUCAP in the last 168 hours. Lipid Profile: No results for input(s): CHOL, HDL, LDLCALC, TRIG, CHOLHDL, LDLDIRECT in the last 72 hours. Thyroid Function Tests: No results for input(s): TSH, T4TOTAL, FREET4, T3FREE, THYROIDAB in the last 72 hours. Anemia Panel: No results for input(s): VITAMINB12, FOLATE, FERRITIN, TIBC, IRON, RETICCTPCT in the last 72 hours. Urine analysis:    Component Value Date/Time   COLORURINE YELLOW 01/28/2014 1504   APPEARANCEUR CLEAR 01/28/2014 1504   LABSPEC >1.030 (H) 01/28/2014 1504   PHURINE 5.5 01/28/2014 1504   GLUCOSEU NEGATIVE 01/28/2014 1504   HGBUR NEGATIVE 01/28/2014 1504   BILIRUBINUR NEGATIVE 01/28/2014 1504   KETONESUR NEGATIVE 01/28/2014 1504   PROTEINUR NEGATIVE 01/28/2014 1504   UROBILINOGEN 0.2 01/28/2014 1504   NITRITE NEGATIVE 01/28/2014 1504   LEUKOCYTESUR NEGATIVE 01/28/2014 1504    Radiological Exams on Admission: Dg Chest 2 View  Result Date: 08/24/2016 CLINICAL DATA:  Chest pain short of breath EXAM: CHEST  2 VIEW COMPARISON:  None. FINDINGS: Normal mediastinum and cardiac silhouette. Chronic central bronchitic markings. Normal pulmonary vasculature. No effusion, infiltrate, or pneumothorax. IMPRESSION: Bronchitic change.  No acute findings Electronically Signed    By: Genevive BiStewart  Edmunds M.D.   On: 08/24/2016 16:09   Ct Abdomen Pelvis W Contrast  Result Date: 08/24/2016 CLINICAL DATA:  Abdominal pain for 5 to 6 years. EXAM: CT ABDOMEN AND PELVIS WITH CONTRAST TECHNIQUE: Multidetector CT imaging of the abdomen and pelvis was performed using the standard protocol following bolus administration of intravenous contrast. CONTRAST:  100mL ISOVUE-300 IOPAMIDOL (ISOVUE-300) INJECTION 61% COMPARISON:  CT 09/17/2015 FINDINGS: Lower chest: Lung bases are clear. Hepatobiliary: No focal hepatic lesion. Postcholecystectomy. No biliary dilatation. Pancreas: Pancreas is normal. No ductal dilatation. No pancreatic inflammation. Spleen: Normal spleen Adrenals/urinary tract: Adrenal glands and kidneys are normal. The ureters and bladder normal. Stomach/Bowel: Stomach, small bowel, appendix, and cecum are normal. Some diverticula of the descending colon without acute inflammation Vascular/Lymphatic: Abdominal aorta is normal caliber with atherosclerotic calcification. There is no retroperitoneal or periportal lymphadenopathy. No pelvic lymphadenopathy. Reproductive: Prostate normal Other: No free fluid. Musculoskeletal: No aggressive osseous lesion. IMPRESSION: 1. No acute findings abdomen pelvis. 2. Mild diverticulosis without diverticulitis. 3. Normal appendix. Electronically Signed   By: Genevive BiStewart  Edmunds M.D.   On: 08/24/2016 16:46    Assessment/Plan Principal Problem:   Acute pancreatitis Admit to MedSurg/observation. Nothing by mouth except meds and sips of water with them. Continue analgesics as needed. Continue antiemetics as needed. Hold furosemide. Follow-up lipase, CMP and CBC in the morning. GI may evaluate in a.m. and perform endoscopic studies per patient.  Active Problems:   GERD (gastroesophageal reflux disease) Famotidine 20 mg IVP every 12 hours. Continue Protonix 40 mg by mouth daily.    Chronic abdominal pain Continue oxycodone 30 mg every 6 hours.     Abnormal LFTs Follow-up CMP in the morning.    Heterozygous factor V Leiden mutation (HCC) On warfarin. Will hold due to possible  EGD per patient. Check daily PT/INR. Bridge with heparin once INR lower and nausea resolved.    HTN (hypertension) Continue amlodipine 10 mg by mouth daily. Hold furosemide. Monitor blood pressure.    Chronic back pain Continue oxycodone as needed. Continue diazepam 5 mg every 6 hours as needed for anxiety/muscle spasms     DVT prophylaxis: On warfarin. Held due to a possible GI procedure. Code Status: Full code. Family Communication: His wife is present in the room. Disposition Plan: Admit for IV hydration, pain control, GI evaluation. Consults called: GI may evaluate in a.m. Admission status: Observation/MedSurg.   Bobette Mo MD [facility] Pager [phone].  If 7PM-7AM, please contact night-coverage [email or web address] Password [email or web address]  08/24/2016, 8:09 PM

## 2016-08-24 NOTE — Telephone Encounter (Signed)
Pt called with a bad connection on the phone and said he was at the hospital and then call disconnected.   [phone]

## 2016-08-24 NOTE — Telephone Encounter (Signed)
See other phone note

## 2016-08-24 NOTE — Assessment & Plan Note (Signed)
EGD/dilation in June 2015 with empiric dilation. BPE July 2015 with focal narrowing of GE junction, obstruction of tablet. Cancelled several EGD/dilations. Now with worsening LUQ pain, still with persistent dysphagia. EGD/dilation in near future as planned.

## 2016-08-24 NOTE — ED Notes (Signed)
MD at bedside. 

## 2016-08-24 NOTE — ED Provider Notes (Signed)
[facility] Provider Note   CSN: [id number] Arrival date & time: 08/24/16  1347     History   Chief Complaint Chief Complaint  Patient presents with  . Abdominal Pain    dark urine    HPI Alex Hoffman is a 55 y.o. male.  HPI  Pt was seen at 1515. Per pt and his family, c/o gradual onset and worsening of constant upper abd "pain" for the past 2 days. Describes the pain as "shooting." Has been associated with nausea. Pt states he was evaluated by his GI MD, and was told his "labs were abnormal" and to "come to the hospital to get admitted." Denies vomiting/diarrhea, no back pain, no CP/SOB, no fevers.   Past Medical History:  Diagnosis Date  . Abdominal distension   . Abdominal pain   . Abnormal EKG    hx left bundle branch block on ekg  . Anemia   . Arthritis   . Arthritis    lower back. Dr. [place]  . Arthritis pain   . Blood in urine    hx of, none current  . BPH (benign prostatic hyperplasia) 01/28/2014  . Bruises easily   . C. difficile colitis 01/2011?  Marland Kitchen. Chills   . Clotting disorder (HCC)   . Cystic thyroid nodule    left  . Difficulty urinating   . Dizziness   . Factor V Leiden (HCC) 07/19/2013  . Fever   . Frequent urination at night   . GERD (gastroesophageal reflux disease)   . GERD (gastroesophageal reflux disease)   . Headache(784.0)   . Hearing loss   . Heterozygous factor V Leiden mutation (HCC) 12/2010  . Heterozygous factor V Leiden mutation (HCC)   . HTN (hypertension)   . HTN (hypertension) 01/28/2014  . Joint pain   . Leg swelling   . Narcotic pain contract exists with [facility] 11/17/2014   Signed on 11/17/2014  . Nasal congestion   . Nausea & vomiting   . Numbness and tingling    both hands, and both legs and feet  . Palpitations   . Pancreatitis 05/2011   EUS Dr Christella HartiganJacobs 06/09/11-> dilated main pancreatic duct in HOP, 2-3 filling defects distal pancreatic duct, peripancreatic fluid resolved  .  Pancreatitis   . Pancreatitis 12/2011  . Pneumonia as child  . Prostatitis, chronic   . Pulmonary embolism (HCC) 12/2010  . Rectal pain   . Sleep apnea    STOPBANG=5  . Weakness   . Weight loss     Patient Active Problem List   Diagnosis Date Noted  . LUQ abdominal pain 08/24/2016  . BREACHED Narcotic pain contract with [facility].  [facility]OT[facility] 11/17/2014  . 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 Nausea without vomiting 01/29/2014  . HTN (hypertension) 01/28/2014  . BPH (benign prostatic hyperplasia) 01/28/2014  . Pain in limb 07/22/2013  . Heterozygous factor V Leiden mutation (HCC) 07/19/2013  . RUQ pain 05/05/2013  . Cholecystitis 03/14/2012  . Diarrhea 09/02/2011  . Pancreatitis 05/24/2011  . Abnormal LFTs 05/24/2011  . Hematuria, gross 05/24/2011  . GERD (gastroesophageal reflux disease) 04/18/2011  . Chronic abdominal pain 04/18/2011  . Dysphagia 04/18/2011  . Rectal bleeding 04/18/2011  . Constipation 04/18/2011    Past Surgical History:  Procedure Laterality Date  . BIOPSY N/A 02/13/2014   Procedure: BIOPSY;  Surgeon: Corbin Adeobert M Rourk, MD;  Location: [facility] OR[facility];  Service: Endoscopy;  Laterality: N/A;  . CARDIAC CATHETERIZATION  10/15/14  . CHOLECYSTECTOMY  01/25/2012   Procedure: LAPAROSCOPIC CHOLECYSTECTOMY WITH INTRAOPERATIVE CHOLANGIOGRAM;  Surgeon: Robyne Askew, MD;  Location: [facility];  Service: General;  Laterality: N/A;  . COLONOSCOPY  01/2011   Dr. Zenon Mayo at [facility]-->normal  . COLONOSCOPY WITH PROPOFOL  11/22/2012   [id number] anal rectum/fissure not identified although he certainly could have a chronic anal fissure   . CYSTOSCOPY WITH HYDRODISTENSION AND BIOPSY  01/25/2012   Procedure: CYSTOSCOPY/BIOPSY/HYDRODISTENSION;  Surgeon: Anner Crete, MD;  Location: [facility];  Service: Urology;  Laterality: N/A;  Cystoscopy and Hydrodistension of the Bladder  . ERCP  07/2011   Dr. Logan Bores [facility]->  pancreatic enterotomy, pancreatic duct stones removed, bile duct not manipulated, no stents  . ESOPHAGEAL DILATION N/A 04/10/2014   UJW:[id number] EG junction otherwise normal EGD  . ESOPHAGOGASTRODUODENOSCOPY  01/2011   Dr. Zenon Mayo at [facility]-->bile reflux in stomach-->patient c/o inadequate conscious sedation (Fentanyl 100mg /Versed 8mg )  . ESOPHAGOGASTRODUODENOSCOPY (EGD) WITH PROPOFOL N/A 02/13/2014   [id number] EGD as described above s/p bx (mild changes of reflux on esophageal bx  . ESOPHAGOGASTRODUODENOSCOPY (EGD) WITH PROPOFOL N/A 04/10/2014   Dr. Jena Gauss: patulous EG junction, s/p 2 F Maloney dilation empirically  . FEMUR FRACTURE SURGERY  age 19   right leg  . KNEE SURGERY  1980's   arthroscopy, left knee  . NECK SURGERY  1999   C5/C6 fusion, limited neck mobility especially turning to left  . NERVE SURGERY  1980's   right thumb       Home Medications    Prior to Admission medications   Medication Sig Start Date End Date Taking? Authorizing Provider  amLODipine (NORVASC) 10 MG tablet Take 10 mg by mouth daily.    Historical Provider, MD  diazepam (VALIUM) 5 MG tablet Take 1 tablet (5 mg total) by mouth every 6 (six) hours as needed for anxiety. 05/06/15   Ellouise Newer, PA-C  furosemide (LASIX) 40 MG tablet Take 40 mg by mouth daily as needed for fluid.  01/19/15   Historical Provider, MD  ibuprofen (ADVIL,MOTRIN) 200 MG tablet Take 400 mg by mouth 2 (two) times daily as needed for moderate pain.    Historical Provider, MD  methadone (DOLOPHINE) 10 MG tablet Take 5 tablets (50 mg total) by mouth every 8 (eight) hours. 05/06/15   Ellouise Newer, PA-C  Multiple Vitamin (MULTIVITAMIN WITH MINERALS) TABS tablet Take 1 tablet by mouth daily.    Historical Provider, MD  naloxegol oxalate (MOVANTIK) 25 MG TABS tablet Take 1 tablet (25 mg total) by mouth daily. 08/23/16   Gelene Mink, NP  omeprazole (PRILOSEC) 20 MG capsule Take 20 mg by mouth daily.    Historical Provider, MD    oxycodone (ROXICODONE) 30 MG immediate release tablet Take 30 mg by mouth every 4 (four) hours as needed for pain.    Historical Provider, MD  pantoprazole (PROTONIX) 40 MG tablet Take 1 tablet (40 mg total) by mouth daily. 08/23/16   Gelene Mink, NP  promethazine (PHENERGAN) 25 MG tablet Take 1/2 tablet to 1 tablet every 6 to 8 hours as needed for nausea. 08/23/16   Gelene Mink, NP  tiZANidine (ZANAFLEX) 4 MG capsule Take 1 capsule up to 4 times a day for spasms. Patient taking differently: Take 4 mg by mouth 4 (four) times daily as needed for muscle spasms.  06/03/14   Alla German, MD  warfarin (COUMADIN) 5  MG tablet Take 5 mg by mouth daily. Takes Monday-Friday    Historical Provider, MD  warfarin (COUMADIN) 7.5 MG tablet Take 7.5 mg by mouth daily. Takes Saturday and Sunday    Historical Provider, MD  zolpidem (AMBIEN) 5 MG tablet Take 1 tablet (5 mg total) by mouth at bedtime as needed for sleep. 01/05/15   Ellouise Newer, PA-C    Family History Family History  Problem Relation Age of Onset  . Hypertension Mother   . Hypertension Father   . Cancer Maternal Aunt     colon/pancreas/lung/stomach  . GI problems Brother   . GI problems      stomach and lung cancer, aunts/uncles  . Heart attack Brother     before age of 8  . Stroke Brother   . Heart disease Maternal Uncle   . Cancer Maternal Grandfather     lung  . Colon cancer Neg Hx   . Liver disease Neg Hx     Social History Social History  Substance Use Topics  . Smoking status: Current Some Day Smoker    Packs/day: 1.00    Years: 35.00    Types: Cigarettes    Last attempt to quit: 12/21/2012  . Smokeless tobacco: Never Used     Comment: About 2 cigarettes per day.   . Alcohol use No     Comment: No alcohol since January 27, 2011     Allergies   Reglan [metoclopramide]   Review of Systems Review of Systems ROS: Statement: All systems negative except as marked or noted in the HPI; Constitutional: Negative for  fever and chills. ; ; Eyes: Negative for eye pain, redness and discharge. ; ; ENMT: Negative for ear pain, hoarseness, nasal congestion, sinus pressure and sore throat. ; ; Cardiovascular: Negative for chest pain, palpitations, diaphoresis, dyspnea and peripheral edema. ; ; Respiratory: Negative for cough, wheezing and stridor. ; ; Gastrointestinal: +nausea, abd pain. Negative for vomiting, diarrhea, blood in stool, hematemesis, jaundice and rectal bleeding. . ; ; Genitourinary: Negative for dysuria, flank pain and hematuria. ; ; Musculoskeletal: Negative for back pain and neck pain. Negative for swelling and trauma.; ; Skin: Negative for pruritus, rash, abrasions, blisters, bruising and skin lesion.; ; Neuro: Negative for headache, lightheadedness and neck stiffness. Negative for weakness, altered level of consciousness, altered mental status, extremity weakness, paresthesias, involuntary movement, seizure and syncope.       Physical Exam Updated Vital Signs BP 156/89 (BP Location: Right Arm)   Pulse 90   Temp 97.7 F (36.5 C) (Oral)   Resp 16   Ht 6\' 3"  (1.905 m)   Wt 205 lb (93 kg)   SpO2 100%   BMI 25.62 kg/m   Physical Exam 1520; Physical examination:  Nursing notes reviewed; Vital signs and O2 SAT reviewed;  Constitutional: Well developed, Well nourished, Well hydrated, In no acute distress; Head:  Normocephalic, atraumatic; Eyes: EOMI, PERRL, No scleral icterus; ENMT: Mouth and pharynx normal, Mucous membranes moist; Neck: Supple, Full range of motion, No lymphadenopathy; Cardiovascular: Regular rate and rhythm, No gallop; Respiratory: Breath sounds clear & equal bilaterally, No wheezes.  Speaking full sentences with ease, Normal respiratory effort/excursion; Chest: Nontender, Movement normal; Abdomen: Soft, +RUQ and mid-epigastric tenderness to palp. No rebound or guarding. Nondistended, Normal bowel sounds; Genitourinary: No CVA tenderness; Extremities: Pulses normal, No tenderness, No  edema, No calf edema or asymmetry.; Neuro: AA&Ox3, Major CN grossly intact.  Speech clear. No gross focal motor or sensory deficits in extremities.;  Skin: Color normal, Warm, Dry.   ED Treatments / Results  Labs (all labs ordered are listed, but only abnormal results are displayed)   EKG  EKG Interpretation None       Radiology   Procedures Procedures (including critical care time)  Medications Ordered in ED Medications  morphine 4 MG/ML injection 4 mg (4 mg Intravenous Given 08/24/16 1543)  ondansetron (ZOFRAN) injection 4 mg (4 mg Intravenous Given 08/24/16 1543)  iopamidol (ISOVUE-300) 61 % injection (not administered)  iopamidol (ISOVUE-300) 61 % injection 100 mL (100 mLs Intravenous Contrast Given 08/24/16 1613)     Initial Impression / Assessment and Plan / ED Course  I have reviewed the triage vital signs and the nursing notes.  Pertinent labs & imaging results that were available during my care of the patient were reviewed by me and considered in my medical decision making (see chart for details).  MDM Reviewed: previous chart, nursing note and vitals Reviewed previous: labs Interpretation: labs, x-ray and CT scan   Results for orders placed or performed during the hospital encounter of 08/24/16  Comprehensive metabolic panel  Result Value Ref Range   Sodium 132 (L) 135 - 145 mmol/L   Potassium 3.6 3.5 - 5.1 mmol/L   Chloride 97 (L) 101 - 111 mmol/L   CO2 27 22 - 32 mmol/L   Glucose, Bld 131 (H) 65 - 99 mg/dL   BUN 7 6 - 20 mg/dL   Creatinine, Ser 1.61 0.61 - 1.24 mg/dL   Calcium 9.0 8.9 - 09.6 mg/dL   Total Protein 8.2 (H) 6.5 - 8.1 g/dL   Albumin 4.0 3.5 - 5.0 g/dL   AST 045 (H) 15 - 41 U/L   ALT 135 (H) 17 - 63 U/L   Alkaline Phosphatase 176 (H) 38 - 126 U/L   Total Bilirubin 1.2 0.3 - 1.2 mg/dL   GFR calc non Af Amer >60 >60 mL/min   GFR calc Af Amer >60 >60 mL/min   Anion gap 8 5 - 15  Lipase, blood  Result Value Ref Range   Lipase 78 (H) 11  - 51 U/L  CBC with Differential  Result Value Ref Range   WBC 11.1 (H) 4.0 - 10.5 K/uL   RBC 5.39 4.22 - 5.81 MIL/uL   Hemoglobin 16.4 13.0 - 17.0 g/dL   HCT 40.9 81.1 - 91.4 %   MCV 86.8 78.0 - 100.0 fL   MCH 30.4 26.0 - 34.0 pg   MCHC 35.0 30.0 - 36.0 g/dL   RDW 78.2 95.6 - 21.3 %   Platelets 324 150 - 400 K/uL   Neutrophils Relative % 67 %   Neutro Abs 7.4 1.7 - 7.7 K/uL   Lymphocytes Relative 26 %   Lymphs Abs 2.9 0.7 - 4.0 K/uL   Monocytes Relative 5 %   Monocytes Absolute 0.6 0.1 - 1.0 K/uL   Eosinophils Relative 2 %   Eosinophils Absolute 0.3 0.0 - 0.7 K/uL   Basophils Relative 0 %   Basophils Absolute 0.0 0.0 - 0.1 K/uL  Protime-INR  Result Value Ref Range   Prothrombin Time 19.5 (H) 11.4 - 15.2 seconds   INR 1.63    Dg Chest 2 View Result Date: 08/24/2016 CLINICAL DATA:  Chest pain short of breath EXAM: CHEST  2 VIEW COMPARISON:  None. FINDINGS: Normal mediastinum and cardiac silhouette. Chronic central bronchitic markings. Normal pulmonary vasculature. No effusion, infiltrate, or pneumothorax. IMPRESSION: Bronchitic change.  No acute findings Electronically Signed   By:  Genevive Bi M.D.   On: 08/24/2016 16:09   Ct Abdomen Pelvis W Contrast Result Date: 08/24/2016 CLINICAL DATA:  Abdominal pain for 5 to 6 years. EXAM: CT ABDOMEN AND PELVIS WITH CONTRAST TECHNIQUE: Multidetector CT imaging of the abdomen and pelvis was performed using the standard protocol following bolus administration of intravenous contrast. CONTRAST:  ISOVUE-300 IOPAMIDOL (ISOVUE-300) INJECTION 61% COMPARISON:  CT 09/17/2015 FINDINGS: Lower chest: Lung bases are clear. Hepatobiliary: No focal hepatic lesion. Postcholecystectomy. No biliary dilatation. Pancreas: Pancreas is normal. No ductal dilatation. No pancreatic inflammation. Spleen: Normal spleen Adrenals/urinary tract: Adrenal glands and kidneys are normal. The ureters and bladder normal. Stomach/Bowel: Stomach, small bowel, appendix, and  cecum are normal. Some diverticula of the descending colon without acute inflammation Vascular/Lymphatic: Abdominal aorta is normal caliber with atherosclerotic calcification. There is no retroperitoneal or periportal lymphadenopathy. No pelvic lymphadenopathy. Reproductive: Prostate normal Other: No free fluid. Musculoskeletal: No aggressive osseous lesion. IMPRESSION: 1. No acute findings abdomen pelvis. 2. Mild diverticulosis without diverticulitis. 3. Normal appendix. Electronically Signed   By: Genevive Bi M.D.   On: 08/24/2016 16:46    1710:  LFT's continue elevated, though Tbili and lipase improving from yesterday's labs. Sent for admission. T/C to [facility] Dr. Robb Matar, case discussed, including:  HPI, pertinent PM/SHx, VS/PE, dx testing, ED course and treatment:  Agreeable to admit, requests he will come to the ED for evaluation.     Final Clinical Impressions(s) / ED Diagnoses   Final diagnoses:  None    New Prescriptions New Prescriptions   No medications on file     Samuel Jester, DO 08/27/16 0830

## 2016-08-24 NOTE — Assessment & Plan Note (Addendum)
55 year old male with chronic abdominal pain, seen by Pain Management in the past and concern for drug-seeking behaviors historically, presenting with worsening pain in LUQ since Thursday. He does have a history significant for pancreatitis (with EUS in Aug 2012 with no evidence of chronic pancreatitis but had pancreatic duct stone requiring Subsequent ERCP with pancreatic duct stone removal in September 2012 and cholecystectomy 2013). Notes associated nausea. Does not appear to be in acute distress at time of visit, but he is tender to palpation on exam. I am well acquainted with patient, and his exam is fairly similar to prior exams (with chronic abdominal pain). Does not appear to have an acute abdomen. The acute on chronic symptoms deserve further attention, specifically with his complicated history.   Will check HFP, lipase today. May need further imaging. Constipation may also be playing a role. I have provided Movantik 25 mg once daily for samples. He will also need an EGD in the near future with dilation due to solid food dysphagia, which we have attempted to arrange on multiple prior occasions. Will proceed with labs first to ensure no acute process.  Proceed with upper endoscopy/dilation in the near future with Dr. Jena Gaussourk. The risks, benefits, and alternatives have been discussed in detail with patient. They have stated understanding and desire to proceed.   PROPOFOL due to polypharmacy  HOLD COUMADIN 3 DAYS PRIOR TO PROCEDURE

## 2016-08-24 NOTE — Telephone Encounter (Signed)
Thank you :)

## 2016-08-24 NOTE — Telephone Encounter (Signed)
[facility] to advise about holding coumadin for 3 days prior to procedure and if Lovenox will be needed.

## 2016-08-25 ENCOUNTER — Encounter (HOSPITAL_COMMUNITY): Payer: Self-pay

## 2016-08-25 DIAGNOSIS — E876 Hypokalemia: Secondary | ICD-10-CM | POA: Diagnosis present

## 2016-08-25 DIAGNOSIS — K859 Acute pancreatitis without necrosis or infection, unspecified: Secondary | ICD-10-CM | POA: Diagnosis present

## 2016-08-25 DIAGNOSIS — K861 Other chronic pancreatitis: Secondary | ICD-10-CM | POA: Diagnosis present

## 2016-08-25 DIAGNOSIS — Z888 Allergy status to other drugs, medicaments and biological substances status: Secondary | ICD-10-CM | POA: Diagnosis not present

## 2016-08-25 DIAGNOSIS — R7989 Other specified abnormal findings of blood chemistry: Secondary | ICD-10-CM | POA: Diagnosis not present

## 2016-08-25 DIAGNOSIS — Z7901 Long term (current) use of anticoagulants: Secondary | ICD-10-CM | POA: Diagnosis not present

## 2016-08-25 DIAGNOSIS — D689 Coagulation defect, unspecified: Secondary | ICD-10-CM | POA: Diagnosis present

## 2016-08-25 DIAGNOSIS — I10 Essential (primary) hypertension: Secondary | ICD-10-CM | POA: Diagnosis present

## 2016-08-25 DIAGNOSIS — N400 Enlarged prostate without lower urinary tract symptoms: Secondary | ICD-10-CM | POA: Diagnosis present

## 2016-08-25 DIAGNOSIS — Z981 Arthrodesis status: Secondary | ICD-10-CM | POA: Diagnosis not present

## 2016-08-25 DIAGNOSIS — K219 Gastro-esophageal reflux disease without esophagitis: Secondary | ICD-10-CM | POA: Diagnosis not present

## 2016-08-25 DIAGNOSIS — Z9049 Acquired absence of other specified parts of digestive tract: Secondary | ICD-10-CM | POA: Diagnosis not present

## 2016-08-25 DIAGNOSIS — Z87442 Personal history of urinary calculi: Secondary | ICD-10-CM | POA: Diagnosis not present

## 2016-08-25 DIAGNOSIS — Z86711 Personal history of pulmonary embolism: Secondary | ICD-10-CM | POA: Diagnosis not present

## 2016-08-25 DIAGNOSIS — R945 Abnormal results of liver function studies: Secondary | ICD-10-CM | POA: Diagnosis present

## 2016-08-25 DIAGNOSIS — D6851 Activated protein C resistance: Secondary | ICD-10-CM | POA: Diagnosis present

## 2016-08-25 DIAGNOSIS — K210 Gastro-esophageal reflux disease with esophagitis: Secondary | ICD-10-CM | POA: Diagnosis not present

## 2016-08-25 DIAGNOSIS — F17210 Nicotine dependence, cigarettes, uncomplicated: Secondary | ICD-10-CM | POA: Diagnosis present

## 2016-08-25 DIAGNOSIS — Z79891 Long term (current) use of opiate analgesic: Secondary | ICD-10-CM | POA: Diagnosis not present

## 2016-08-25 DIAGNOSIS — K5790 Diverticulosis of intestine, part unspecified, without perforation or abscess without bleeding: Secondary | ICD-10-CM | POA: Diagnosis present

## 2016-08-25 DIAGNOSIS — H9190 Unspecified hearing loss, unspecified ear: Secondary | ICD-10-CM | POA: Diagnosis present

## 2016-08-25 DIAGNOSIS — Z79899 Other long term (current) drug therapy: Secondary | ICD-10-CM | POA: Diagnosis not present

## 2016-08-25 DIAGNOSIS — R109 Unspecified abdominal pain: Secondary | ICD-10-CM | POA: Diagnosis present

## 2016-08-25 DIAGNOSIS — G8929 Other chronic pain: Secondary | ICD-10-CM | POA: Diagnosis not present

## 2016-08-25 LAB — CBC WITH DIFFERENTIAL/PLATELET
BASOS ABS: 0 10*3/uL (ref 0.0–0.1)
BASOS PCT: 0 %
EOS ABS: 0.3 10*3/uL (ref 0.0–0.7)
EOS PCT: 4 %
HCT: 42.3 % (ref 39.0–52.0)
Hemoglobin: 14.3 g/dL (ref 13.0–17.0)
Lymphocytes Relative: 35 %
Lymphs Abs: 2.8 10*3/uL (ref 0.7–4.0)
MCH: 29.7 pg (ref 26.0–34.0)
MCHC: 33.8 g/dL (ref 30.0–36.0)
MCV: 87.9 fL (ref 78.0–100.0)
MONO ABS: 0.8 10*3/uL (ref 0.1–1.0)
Monocytes Relative: 9 %
NEUTROS ABS: 4.3 10*3/uL (ref 1.7–7.7)
Neutrophils Relative %: 52 %
PLATELETS: 298 10*3/uL (ref 150–400)
RBC: 4.81 MIL/uL (ref 4.22–5.81)
RDW: 13.1 % (ref 11.5–15.5)
WBC: 8.2 10*3/uL (ref 4.0–10.5)

## 2016-08-25 LAB — COMPREHENSIVE METABOLIC PANEL
ALBUMIN: 3.4 g/dL — AB (ref 3.5–5.0)
ALK PHOS: 142 U/L — AB (ref 38–126)
ALT: 110 U/L — ABNORMAL HIGH (ref 17–63)
ANION GAP: 6 (ref 5–15)
AST: 114 U/L — ABNORMAL HIGH (ref 15–41)
BILIRUBIN TOTAL: 1.9 mg/dL — AB (ref 0.3–1.2)
BUN: 6 mg/dL (ref 6–20)
CALCIUM: 8.4 mg/dL — AB (ref 8.9–10.3)
CO2: 27 mmol/L (ref 22–32)
Chloride: 102 mmol/L (ref 101–111)
Creatinine, Ser: 0.82 mg/dL (ref 0.61–1.24)
GFR calc Af Amer: >60 mL/min (ref >60)
GFR calc non Af Amer: >60 mL/min (ref >60)
GLUCOSE: 93 mg/dL (ref 65–99)
Potassium: 3.9 mmol/L (ref 3.5–5.1)
Sodium: 135 mmol/L (ref 135–145)
TOTAL PROTEIN: 6.8 g/dL (ref 6.5–8.1)

## 2016-08-25 LAB — LIPASE, BLOOD: LIPASE: 55 U/L — AB (ref 11–51)

## 2016-08-25 MED ORDER — KCL IN DEXTROSE-NACL 20-5-0.9 MEQ/L-%-% IV SOLN
INTRAVENOUS | Status: DC
  Administered 2016-08-25: 125 mL/h via INTRAVENOUS
  Administered 2016-08-26: 02:00:00 via INTRAVENOUS

## 2016-08-25 MED ORDER — HYDROMORPHONE HCL 1 MG/ML IJ SOLN
0.5000 mg | INTRAMUSCULAR | Status: DC | PRN
  Administered 2016-08-25 – 2016-08-26 (×7): 0.5 mg via INTRAVENOUS
  Filled 2016-08-25 (×7): qty 0.5

## 2016-08-25 NOTE — Progress Notes (Signed)
PROGRESS NOTE    Alex PernaDavid L Klug  ZOX:[id number] DOB: 1961/06/13 DOA: 08/24/2016 PCP: No PCP Per Patient    Brief Narrative: [age] yom with a history of chronic abdominal pain, recurrent pancreatitis, ECRP with pancreatic duct gallstone removal, BPH, GERD, and HTN, presented with complaints of abdominal pain. While in the ER, he was noted to be hypokalemic and leukocytotic. Labs show alkaline phosphate of 176 and a white blood cell count of 11.1,  Abdominal CT shows mild diverticulosis. He was started on IV fluids, IV analgesics, and antiemetics and admitted for further evaluation of acute pancreatitis.   Assessment & Plan:   Principal Problem:   Acute pancreatitis Active Problems:   GERD (gastroesophageal reflux disease)   Chronic abdominal pain   Abnormal LFTs   Heterozygous factor V Leiden mutation (HCC)   HTN (hypertension)   Chronic back pain  1. Acute pancreatitis. Abdominal CT shows diverticulosis without diverticulitis. Will give full liquid. Continue with IVF. Hold lasix. Will consult GI for possible ERCP.   Continue supportive management with IV fluids, analgesics, and antiemetics.  2. GERD. Continue with PPI.  3. Leukocytosis. Elevated white blood cell count of 11,100. UA unremarkable.  4. Heterozygous factor V Leiden mutation. He is anticoagulated on coumadin, will hold for EGD. Bridge with heparin once INR lowers and nausea is resolved.   5. Elevated LFTs. Follow CMP.  6. HTN. Pressures are elevated. Continue amlodipine. Hold furosemide.  7. Chronic back and abdominal pain. Continue pain management with Oxycodone.    DVT prophylaxis: Coumadin, on hold. Code Status: FULL  Family Communication: Wife.  Disposition Plan: Discharge home once improved.    Consultants:   Gastroenterology   Procedures:   None   Antimicrobials:   None    Subjective:  Abdominal pain improved.  Hungry and would like to eat.   Objective: Vitals:   08/24/16 2000 08/24/16 2022  08/24/16 2233 08/25/16 0651  BP: 130/89  (!) 174/92 (!) 142/84  Pulse: 78  78 63  Resp: 18  18 18   Temp:   98.1 F (36.7 C) 98.1 F (36.7 C)  TempSrc:   Oral Oral  SpO2: 96% 100% 100% 99%  Weight:      Height:        Intake/Output Summary (Last 24 hours) at 08/25/16 0714 Last data filed at 08/25/16 0400  Gross per 24 hour  Intake           856.25 ml  Output                0 ml  Net           856.25 ml   Filed Weights   08/24/16 1421  Weight: 93 kg (205 lb)    Examination:  General exam: Appears calm and comfortable  Respiratory system: Clear to auscultation. Respiratory effort normal. Cardiovascular system: S1 & S2 heard, RRR. No JVD, murmurs, rubs, gallops or clicks. No pedal edema. Gastrointestinal system: Abdomen is nondistended, soft and nontender. No organomegaly or masses felt. Normal bowel sounds heard. Central nervous system: Alert and oriented. No focal neurological deficits. Extremities: Symmetric 5 x 5 power. Skin: No rashes, lesions or ulcers Psychiatry: Judgement and insight appear normal. Mood & affect appropriate.     Data Reviewed: I have personally reviewed following labs and imaging studies   Recent Labs Lab 08/24/16 1543  WBC 11.1*  NEUTROABS 7.4  HGB 16.4  HCT 46.8  MCV 86.8  PLT 324    Recent Labs Lab 08/23/16  1242 08/24/16 1543  NA 134* 132*  K 4.2 3.6  CL 97* 97*  CO2 26 27  GLUCOSE 97 131*  BUN 7 7  CREATININE 0.72 0.95  CALCIUM 9.2 9.0    Recent Labs Lab 08/23/16 1242 08/24/16 1543  AST 271* 129*  ALT 132* 135*  ALKPHOS 177* 176*  BILITOT 3.3* 1.2  PROT 7.2 8.2*  ALBUMIN 4.0 4.0    Recent Labs Lab 08/23/16 1242 08/24/16 1543  LIPASE 134* 78*    Recent Labs Lab 08/24/16 1543  INR 1.63     Radiology Studies: Dg Chest 2 View  Result Date: 08/24/2016 CLINICAL DATA:  Chest pain short of breath EXAM: CHEST  2 VIEW COMPARISON:  None. FINDINGS: Normal mediastinum and cardiac silhouette. Chronic central  bronchitic markings. Normal pulmonary vasculature. No effusion, infiltrate, or pneumothorax. IMPRESSION: Bronchitic change.  No acute findings Electronically Signed   By: Genevive Bi M.D.   On: 08/24/2016 16:09   Ct Abdomen Pelvis W Contrast  Result Date: 08/24/2016 CLINICAL DATA:  Abdominal pain for 5 to 6 years. EXAM: CT ABDOMEN AND PELVIS WITH CONTRAST TECHNIQUE: Multidetector CT imaging of the abdomen and pelvis was performed using the standard protocol following bolus administration of intravenous contrast. CONTRAST:  ISOVUE-300 IOPAMIDOL (ISOVUE-300) INJECTION 61% COMPARISON:  CT 09/17/2015 FINDINGS: Lower chest: Lung bases are clear. Hepatobiliary: No focal hepatic lesion. Postcholecystectomy. No biliary dilatation. Pancreas: Pancreas is normal. No ductal dilatation. No pancreatic inflammation. Spleen: Normal spleen Adrenals/urinary tract: Adrenal glands and kidneys are normal. The ureters and bladder normal. Stomach/Bowel: Stomach, small bowel, appendix, and cecum are normal. Some diverticula of the descending colon without acute inflammation Vascular/Lymphatic: Abdominal aorta is normal caliber with atherosclerotic calcification. There is no retroperitoneal or periportal lymphadenopathy. No pelvic lymphadenopathy. Reproductive: Prostate normal Other: No free fluid. Musculoskeletal: No aggressive osseous lesion. IMPRESSION: 1. No acute findings abdomen pelvis. 2. Mild diverticulosis without diverticulitis. 3. Normal appendix. Electronically Signed   By: Genevive Bi M.D.   On: 08/24/2016 16:46        Scheduled Meds: . amLODipine  10 mg Oral Daily  . famotidine (PEPCID) IV  20 mg Intravenous Q12H  . pantoprazole  40 mg Oral Daily   Continuous Infusions: . 0.9 % NaCl with KCl 20 mEq / L 125 mL/hr at 08/24/16 2133     LOS: 0 days    Time spent: 25 minutes     Houston Siren, MD FACP [facility] If 7PM-7AM, please contact night-coverage [email or web address] Password  [email or web address] 08/25/2016, 7:14 AM   By signing my name below, I, Cynda Acres, attest that this documentation has been prepared under the direction and in the presence of Houston Siren, MD. Electronically signed: Cynda Acres, Scribe. 08/25/16

## 2016-08-25 NOTE — Progress Notes (Signed)
Initial Nutrition Assessment  DOCUMENTATION CODES:  Not applicable  INTERVENTION:   If pancreatitis sole reason for NPO status, recommend advancement to CL.   Then monitor PO intake and add supplements as warranted  NUTRITION DIAGNOSIS:  Inadequate oral intake related to Abdominal pain as evidenced by an estimated  energy intake that met < or equal to 50% of needs for > or equal to 5 days.  GOAL:  Patient will meet greater than or equal to 90% of their needs  MONITOR:  PO intake, Diet advancement, Labs, Weight trends, I & O's  REASON FOR ASSESSMENT:  Malnutrition Screening Tool    ASSESSMENT:  55 y/o male PMHx recurrent pancreatitis, ERCP w/ pancreatic duct gallstone removal, chronic abdominal pain, GERD, Clotting disorder, HTN, sleep apnea presents from GI outpatient office for eval. Had been having intense abdominal pain w/ nausea.   Pt reports that his abdominal pain began last Thursday. Initially, PO intake made his pain worse, however once he transitioned to mainly clear liquids and broths, the pain was no longer worsened with PO intake and would intermittently reappear. The pain intensity worsened to the point where he didn't eat anything for the last 2-3 days.   He has been constipated. Reports only 1 BM in last 7 days.   He says he was weighed at outpatient appointment last week as 212 lbs. There is no documentation to confirm this. Long term, appears to have lost ~10 lbs in 5 months which is not clinically significant.   Pt does report severe abdominal swelling. RD did not assess as he says it is extremely painful to the touch. He asked RD to ask RN to bring his pain meds.   As of now, pt says that he DOES have an appetite and would like to have something to eat. . Literature suggests that NPO status is not needed to resolve pancreatits, will recommend diet advancement to CL if this is sole reason for no PO intake.  RD asked MD about Diet advancement  NFPE: WDL, did not  assess abdomen  Labs: Elevated liver Enzymes/alk phos/bili/lipase, Albumin:3.4, WBC: 8.2  Recent Labs Lab 08/23/16 1242 08/24/16 1543 08/25/16 0621  NA 134* 132* 135  K 4.2 3.6 3.9  CL 97* 97* 102  CO2 _0 BUN _1 CREATININE 0.72 0.95 0.82  CALCIUM 9.2 9.0 8.4*  GLUCOSE 97 131* 93    Diet Order:  Diet clear liquid Room service appropriate? Yes; Fluid consistency: Thin  Skin:  Reviewed, no issues  Last BM:  10/19  Height:  Ht Readings from Last 1 Encounters:  08/24/16 6' 3" (1.905 m)   Weight:  Wt Readings from Last 1 Encounters:  08/24/16 205 lb (93 kg)   Wt Readings from Last 10 Encounters:  08/24/16 205 lb (93 kg)  08/23/16 206 lb 9.6 oz (93.7 kg)  03/16/16 217 lb (98.4 kg)  02/26/16 218 lb 6.4 oz (99.1 kg)  09/14/15 203 lb 9.6 oz (92.4 kg)  05/06/15 205 lb 9.6 oz (93.3 kg)  03/11/15 212 lb 11.2 oz (96.5 kg)  01/12/15 221 lb (100.2 kg)  11/17/14 219 lb (99.3 kg)  10/17/14 218 lb 9.6 oz (99.2 kg)   Ideal Body Weight:  89.1 kg  BMI:  Body mass index is 25.62 kg/m.  Estimated Nutritional Needs:  Kcal:  2100-2300 (23-25 kcal/kg bw) Protein:  100-115 g Pro (1.1-1.3 g/kg bw) Fluid:  >2.1 liters   EDUCATION NEEDS:  No education needs identified at  this time    RD, LDN, CNSC Clinical Nutrition Pager: [phone] 08/25/2016 3:13 PM  

## 2016-08-25 NOTE — Care Management Obs Status (Signed)
MEDICARE OBSERVATION STATUS NOTIFICATION   Patient Details  Name: Alex Hoffman MRN: [id number] Date of Birth: 1961/06/30   Medicare Observation Status Notification Given:  Yes    Katilyn Miltenberger, Chrystine OilerSharley Diane, RN 08/25/2016, 11:49 AM

## 2016-08-25 NOTE — Care Management Note (Signed)
Case Management Note  Patient Details  Name: Alex PernaDavid L Schmuck MRN: [id number] Date of Birth: 1961-07-27  Subjective/Objective: Patient adm from home with acute pancreatitis. He is ind with ADL's. He does have a cane and walker at home. He has transportation and insurance. He is currently looking for a PCP.                    Action/Plan: Anticipate DC home with self care. List of PCP providers given to help assist in PCP search.   Expected Discharge Date:    08/26/2016          Expected Discharge Plan:  Home/Self Care  In-House Referral:  NA  Discharge planning Services  CM Consult  Post Acute Care Choice:  NA Choice offered to:  NA  DME Arranged:    DME Agency:     HH Arranged:    HH Agency:     Status of Service:  Completed, signed off  If discussed at [employer] Length of Stay Meetings, dates discussed:    Additional Comments:  Dail Lerew, Chrystine OilerSharley Diane, RN 08/25/2016, 11:42 AM

## 2016-08-26 ENCOUNTER — Encounter (HOSPITAL_COMMUNITY): Payer: Self-pay

## 2016-08-26 DIAGNOSIS — R109 Unspecified abdominal pain: Secondary | ICD-10-CM

## 2016-08-26 DIAGNOSIS — K859 Acute pancreatitis without necrosis or infection, unspecified: Principal | ICD-10-CM

## 2016-08-26 DIAGNOSIS — K210 Gastro-esophageal reflux disease with esophagitis: Secondary | ICD-10-CM

## 2016-08-26 DIAGNOSIS — R7989 Other specified abnormal findings of blood chemistry: Secondary | ICD-10-CM

## 2016-08-26 DIAGNOSIS — K219 Gastro-esophageal reflux disease without esophagitis: Secondary | ICD-10-CM

## 2016-08-26 DIAGNOSIS — G8929 Other chronic pain: Secondary | ICD-10-CM

## 2016-08-26 NOTE — Discharge Planning (Signed)
RN attempted to make FU appt at pain clinic for patient, but office closed and not open till Monday.

## 2016-08-26 NOTE — Consult Note (Signed)
Referring Provider: [facility] Primary Care Physician:  No PCP Per Patient Primary Gastroenterologist:  Dr. Gala Romney  Date of Admission: 08/24/16 Date of Consultation: 08/26/16  Reason for Consultation:  Abdominal pain, elevated LFTs.  HPI:  Alex Hoffman is a 55 y.o. male with a past medical history of chronic abdominal pain, Factor V Leiden, GERD, N/V, pancreatitis (with EUS in Aug 2012 with no evidence of chronic pancreatitis but had pancreatic duct stone requiring Subsequent ERCP with pancreatic duct stone removal in September 2012 and cholecystectomy 2013). He is well-known to our practice. He has had drug-seeking behaviors with [place] in the past and has been seen by multiple pain management clinics. EGD/dilation June 2015. BPE July 2015 with focal narrowing of the GE junction, obstruction of tablet. He was seen at [facility] in Oct 2015 for a second opinion secondary to chronic abdominal pain; he was supposed to have a manometry for dysphagia but did not complete. He has been arranged for EGD +/- dilation on several prior occasions which HE HAS CANCELLED.   Recently seen in our office 08/23/16 for worsening LUQ pain after going to [place] and again after attempting to eat [employer] n [employer]. Also significant nausea. Taking omeprazole OTC rather then Dexilant prescribed for him because he "ran out." Has not had an appointment with new pain management in [place]. Question element of constipation. He was started on movantik, arranged for ANOTHER outpatient EGD +/- dilation (propofol due to polypharmacy). Labs ordered: HFP, lipase.  HFP abnormal with AST/ALT 271/132, bili 3.3, alk phos 177. Lipase abnormal at 134. Given worsening pain and lab abnormalities he was referred to the ED. INR 1.63 in the ED. CT abdomen/pelvis with no acute findings, mild diverticulosis without diverticulitis, normal appendix. Labs have improved in the past 3 days and AST/ALT now 114/110, bili 1.9, alk phos 142, lipase  55.  Today he states his abdominal pain is improving as is nausea. He notes his abdominal pain is mostly upper abdomen and started when he had spicy food at [place] along with a Alyson Locket. He states he thought we were going to do EGD as inpatient. Discussed need to complete outpatient EGD, states he will do this ASAP. Denies N/V, continued abdominal pain. Is tolerating his diet well. No BM since admission, no hematochezia/emelena noted. No further GI complaints.  Past Medical History:  Diagnosis Date  . Abdominal distension   . Abdominal pain   . Abnormal EKG    hx left bundle branch block on ekg  . Anemia   . Arthritis   . Arthritis    lower back. Dr. Effie Berkshire in [place]  . Arthritis pain   . Blood in urine    hx of, none current  . BPH (benign prostatic hyperplasia) 01/28/2014  . Bruises easily   . C. difficile colitis 01/2011?  Marland Kitchen Chills   . Clotting disorder ([place])   . Cystic thyroid nodule    left  . Difficulty urinating   . Dizziness   . Factor V Leiden ([place]) 07/19/2013  . Fever   . Frequent urination at night   . GERD (gastroesophageal reflux disease)   . GERD (gastroesophageal reflux disease)   . Headache(784.0)   . Hearing loss   . Heterozygous factor V Leiden mutation ([place]) 12/2010  . Heterozygous factor V Leiden mutation ([place])   . HTN (hypertension)   . HTN (hypertension) 01/28/2014  . Joint pain   . Leg swelling   . Narcotic pain contract exists with [facility]  [place] 11/17/2014   Signed on 11/17/2014  . Nasal congestion   . Nausea & vomiting   . Numbness and tingling    both hands, and both legs and feet  . Palpitations   . Pancreatitis 05/2011   EUS Dr Ardis Hughs 06/09/11-> dilated main pancreatic duct in HOP, 2-3 filling defects distal pancreatic duct, peripancreatic fluid resolved  . Pancreatitis   . Pancreatitis 12/2011  . Pneumonia as child  . Prostatitis, chronic   . Pulmonary embolism ([place]) 12/2010  . Rectal pain   . Sleep apnea    STOPBANG=5  .  Weakness   . Weight loss     Past Surgical History:  Procedure Laterality Date  . BIOPSY N/A 02/13/2014   Procedure: BIOPSY;  Surgeon: Daneil Dolin, MD;  Location: [facility];  Service: Endoscopy;  Laterality: N/A;  . CARDIAC CATHETERIZATION  10/15/14  . CHOLECYSTECTOMY  01/25/2012   Procedure: LAPAROSCOPIC CHOLECYSTECTOMY WITH INTRAOPERATIVE CHOLANGIOGRAM;  Surgeon: Merrie Roof, MD;  Location: [facility];  Service: General;  Laterality: N/A;  . COLONOSCOPY  01/2011   Dr. Annitta Jersey at [facility]-->normal  . COLONOSCOPY WITH PROPOFOL  11/22/2012   DEY:[id number] anal rectum/fissure not identified although he certainly could have a chronic anal fissure   . CYSTOSCOPY WITH HYDRODISTENSION AND BIOPSY  01/25/2012   Procedure: CYSTOSCOPY/BIOPSY/HYDRODISTENSION;  Surgeon: Malka So, MD;  Location: [facility];  Service: Urology;  Laterality: N/A;  Cystoscopy and Hydrodistension of the Bladder  . ERCP  07/2011   Dr. Amalia Hailey [facility]-> pancreatic enterotomy, pancreatic duct stones removed, bile duct not manipulated, no stents  . ESOPHAGEAL DILATION N/A 04/10/2014   HUD:[id number] EG junction otherwise normal EGD  . ESOPHAGOGASTRODUODENOSCOPY  01/2011   Dr. Annitta Jersey at [facility]-->bile reflux in stomach-->patient c/o inadequate conscious sedation (Fentanyl '100mg'$ /Versed '8mg'$ )  . ESOPHAGOGASTRODUODENOSCOPY (EGD) WITH PROPOFOL N/A 02/13/2014   ZCH:[id number] EGD as described above s/p bx (mild changes of reflux on esophageal bx  . ESOPHAGOGASTRODUODENOSCOPY (EGD) WITH PROPOFOL N/A 04/10/2014   Dr. Gala Romney: patulous EG junction, s/p 60 F Maloney dilation empirically  . FEMUR FRACTURE SURGERY  age 48   right leg  . KNEE SURGERY  1980's   arthroscopy, left knee  . NECK SURGERY  1999   C5/C6 fusion, limited neck mobility especially turning to left  . NERVE SURGERY  1980's   right thumb    Prior to Admission medications   Medication Sig Start Date End Date Taking? Authorizing Provider  amLODipine (NORVASC) 10 MG tablet Take  10 mg by mouth daily.   Yes Historical Provider, MD  amoxicillin (AMOXIL) 875 MG tablet Take 875 mg by mouth 2 (two) times daily. 7 day course starting on 08/18/2016 08/18/16  Yes Historical Provider, MD  dexlansoprazole (DEXILANT) 60 MG capsule Take 60 mg by mouth daily.   Yes Historical Provider, MD  diazepam (VALIUM) 5 MG tablet Take 1 tablet (5 mg total) by mouth every 6 (six) hours as needed for anxiety. 05/06/15  Yes Manon Hilding Kefalas, PA-C  furosemide (LASIX) 40 MG tablet Take 40 mg by mouth daily as needed for fluid.  01/19/15  Yes Historical Provider, MD  methadone (DOLOPHINE) 10 MG tablet Take 5 tablets (50 mg total) by mouth every 8 (eight) hours. 05/06/15  Yes Baird Cancer, PA-C  Multiple Vitamin (MULTIVITAMIN WITH MINERALS) TABS tablet Take 1 tablet by mouth daily.   Yes Historical Provider, MD  oxycodone (ROXICODONE) 30 MG immediate release tablet Take 30 mg by mouth every 4 (  four) hours as needed for pain.   Yes Historical Provider, MD  promethazine (PHENERGAN) 25 MG tablet Take 1/2 tablet to 1 tablet every 6 to 8 hours as needed for nausea. Patient taking differently: Take 25 mg by mouth every 6 (six) hours as needed for nausea or vomiting. every 6 to 8 hours as needed for nausea. 08/23/16  Yes Annitta Needs, NP  tiZANidine (ZANAFLEX) 4 MG capsule Take 1 capsule up to 4 times a day for spasms. Patient taking differently: Take 4 mg by mouth 4 (four) times daily as needed for muscle spasms.  06/03/14  Yes Farrel Gobble, MD  warfarin (COUMADIN) 5 MG tablet Take 5-7.5 mg by mouth daily. Takes '5MG'$  on Monday-Friday. Takes 7.'5MG'$  on Saturday and Sunday   Yes Historical Provider, MD  zolpidem (AMBIEN) 5 MG tablet Take 1 tablet (5 mg total) by mouth at bedtime as needed for sleep. 01/05/15  Yes Manon Hilding Kefalas, PA-C  naloxegol oxalate (MOVANTIK) 25 MG TABS tablet Take 1 tablet (25 mg total) by mouth daily. Patient not taking: Reported on 08/24/2016 08/23/16   Annitta Needs, NP  pantoprazole  (PROTONIX) 40 MG tablet Take 1 tablet (40 mg total) by mouth daily. Patient not taking: Reported on 08/24/2016 08/23/16   Annitta Needs, NP    Current Facility-Administered Medications  Medication Dose Route Frequency Provider Last Rate Last Dose  . amLODipine (NORVASC) tablet 10 mg  10 mg Oral Daily Reubin Milan, MD   10 mg at 08/25/16 0851  . dextrose 5 % and 0.9 % NaCl with KCl 20 mEq/L infusion   Intravenous Continuous Orvan Falconer, MD 125 mL/hr at 08/26/16 0203    . diazepam (VALIUM) tablet 5 mg  5 mg Oral Q6H PRN Reubin Milan, MD   5 mg at 08/25/16 2113  . famotidine (PEPCID) IVPB 20 mg premix  20 mg Intravenous Q12H Reubin Milan, MD 100 mL/hr at 08/25/16 2114 20 mg at 08/25/16 2114  . HYDROmorphone (DILAUDID) injection 0.5 mg  0.5 mg Intravenous Q2H PRN Orvan Falconer, MD   0.5 mg at 08/26/16 0545  . ondansetron (ZOFRAN) injection 4 mg  4 mg Intravenous Q6H PRN Reubin Milan, MD   4 mg at 08/24/16 1837  . ondansetron (ZOFRAN) injection 4 mg  4 mg Intravenous Q6H PRN Reubin Milan, MD      . pantoprazole (PROTONIX) EC tablet 40 mg  40 mg Oral Daily Reubin Milan, MD   40 mg at 08/25/16 0851  . zolpidem (AMBIEN) tablet 5 mg  5 mg Oral QHS PRN Reubin Milan, MD   5 mg at 08/25/16 2114    Allergies as of 08/24/2016 - Review Complete 08/24/2016  Allergen Reaction Noted  . Reglan [metoclopramide] Other (See Comments) 08/09/2013    Family History  Problem Relation Age of Onset  . Hypertension Mother   . Hypertension Father   . Cancer Maternal Aunt     colon/pancreas/lung/stomach  . GI problems Brother   . GI problems      stomach and lung cancer, aunts/uncles  . Heart attack Brother     before age of 58  . Stroke Brother   . Heart disease Maternal Uncle   . Cancer Maternal Grandfather     lung  . Colon cancer Neg Hx   . Liver disease Neg Hx     Social History   Social History  . Marital status: Married    Spouse name: N/A  .  Number of children:  2  . Years of education: N/A   Occupational History  .      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 Smoking status: Current Some Day Smoker    Packs/day: 1.00    Years: 35.00    Types: Cigarettes    Last attempt to quit: 12/21/2012  . Smokeless tobacco: Never Used     Comment: About 2 cigarettes per day.   . Alcohol use No     Comment: No alcohol since January 27, 2011  . Drug use: No     Comment: hx of cocaine use; none since last summer  . Sexual activity: Not on file   Other Topics Concern  . Not on file   Social History Narrative  . No narrative on file    Review of Systems: General: Negative for anorexia, weight loss, fever, chills. Eyes: Negative for vision changes.  ENT: Negative for hoarseness, difficulty swallowing , nasal congestion. CV: Negative for chest pain, angina, palpitations, peripheral edema.  Respiratory: Negative for dyspnea at rest, cough, sputum, wheezing.  GI: See history of present illness. Endo: Negative for unusual weight change.  Heme: Negative for bruising or bleeding. Allergy: Negative for rash or hives.  Physical Exam: Vital signs in last 24 hours: Temp:  [97.9 F (36.6 C)-98.3 F (36.8 C)] 98.2 F (36.8 C) (10/27 0543) Pulse Rate:  [58-70] 62 (10/27 0543) Resp:  [18-20] 18 (10/27 0543) BP: (125-160)/(72-82) 128/74 (10/27 0543) SpO2:  [98 %-100 %] 100 % (10/27 0543) Last BM Date: 08/18/16 General:   Alert,  Well-developed, well-nourished, pleasant and cooperative in NAD Head:  Normocephalic and atraumatic. Eyes:  Sclera clear, no icterus. Conjunctiva pink. Ears:  Normal auditory acuity. Nose:  No deformity, discharge, or lesions. Lungs:  Clear throughout to auscultation.   No wheezes, crackles, or rhonchi. No acute distress. Heart:  Regular rate and rhythm; no murmurs, clicks, rubs, or gallops. Abdomen:  Soft, and nondistended. Significant generalized  abdominal TTP. No masses, hepatosplenomegaly or hernias noted. Normal bowel sounds, without guarding, and without rebound.   Rectal:  Deferred.   Msk:  Symmetrical without gross deformities. Pulses:  Normal bilateral DP pulses noted. Extremities:  Without clubbing or edema. Neurologic:  Alert and oriented x4;  grossly normal neurologically. Cervical Nodes:  No significant cervical adenopathy. Psych:  Alert and cooperative. Normal mood and affect.  Intake/Output from previous day: 10/26 0701 - 10/27 0700 In: 1740.4 [P.O.:480; I.V.:1210.4; IV Piggyback:50] Out: 1300 [Urine:1300] Intake/Output this shift: No intake/output data recorded.  Lab Results:  Recent Labs  08/24/16 1543 08/25/16 0621  WBC 11.1* 8.2  HGB 16.4 14.3  HCT 46.8 42.3  PLT 324 298   BMET  Recent Labs  08/23/16 1242 08/24/16 1543 08/25/16 0621  NA 134* 132* 135  K 4.2 3.6 3.9  CL 97* 97* 102  CO2 '26 27 27  '$ GLUCOSE 97 131* 93  BUN '7 7 6  '$ CREATININE 0.72 0.95 0.82  CALCIUM 9.2 9.0 8.4*   LFT  Recent Labs  08/23/16 1242 08/24/16 1543 08/25/16 0621  PROT 7.2 8.2* 6.8  ALBUMIN 4.0 4.0 3.4*  AST 271* 129* 114*  ALT 132* 135* 110*  ALKPHOS 177* 176* 142*  BILITOT 3.3* 1.2 1.9*   PT/INR  Recent Labs  08/24/16 1543  LABPROT 19.5*  INR 1.63   Hepatitis Panel No results for input(s): HEPBSAG, [place], HEPAIGM,  HEPBIGM in the last 72 hours. C-Diff No results for input(s): CDIFFTOX in the last 72 hours.  Studies/Results: Dg Chest 2 View  Result Date: 08/24/2016 CLINICAL DATA:  Chest pain short of breath EXAM: CHEST  2 VIEW COMPARISON:  None. FINDINGS: Normal mediastinum and cardiac silhouette. Chronic central bronchitic markings. Normal pulmonary vasculature. No effusion, infiltrate, or pneumothorax. IMPRESSION: Bronchitic change.  No acute findings Electronically Signed   By: Suzy Bouchard M.D.   On: 08/24/2016 16:09   Ct Abdomen Pelvis W Contrast  Result Date: 08/24/2016 CLINICAL  DATA:  Abdominal pain for 5 to 6 years. EXAM: CT ABDOMEN AND PELVIS WITH CONTRAST TECHNIQUE: Multidetector CT imaging of the abdomen and pelvis was performed using the standard protocol following bolus administration of intravenous contrast. CONTRAST:  134m ISOVUE-300 IOPAMIDOL (ISOVUE-300) INJECTION 61% COMPARISON:  CT 09/17/2015 FINDINGS: Lower chest: Lung bases are clear. Hepatobiliary: No focal hepatic lesion. Postcholecystectomy. No biliary dilatation. Pancreas: Pancreas is normal. No ductal dilatation. No pancreatic inflammation. Spleen: Normal spleen Adrenals/urinary tract: Adrenal glands and kidneys are normal. The ureters and bladder normal. Stomach/Bowel: Stomach, small bowel, appendix, and cecum are normal. Some diverticula of the descending colon without acute inflammation Vascular/Lymphatic: Abdominal aorta is normal caliber with atherosclerotic calcification. There is no retroperitoneal or periportal lymphadenopathy. No pelvic lymphadenopathy. Reproductive: Prostate normal Other: No free fluid. Musculoskeletal: No aggressive osseous lesion. IMPRESSION: 1. No acute findings abdomen pelvis. 2. Mild diverticulosis without diverticulitis. 3. Normal appendix. Electronically Signed   By: SSuzy BouchardM.D.   On: 08/24/2016 16:46    Impression: 55year old male with chronic abdominal pain who was referred to the ED for evaluation and possible admission after being seen in the office. His abdominal pain in the office seemed worse then normal and labs demonstrated elevated LFTs and lipase. His liver function tests and lipase have improved. Abdominal pain seems baseline. He is tolerating a soft diet. He has cancelled multiple EGDs and frequently emails our office asking for pain medications for chronic pain. He has been referred to pain management in [place] but has not had an appointment.   He has had cholecystectomy in 2013 after gallstone pancreatitis in 2012. His CT did not show acute process,  specifically nomal pancreas without inflammation or ductal dilation, no biliary dilatation.  Etiologies include chronic pancreatitis, GERD, gastritis, esophagitis, ETOH mild pancreatitis without imaging evidence. His situation is a bit of a connundrum with normal CT but elevated enzymes. It is worth noting he had spicy foor and ETOH as trigger for his symptoms.  Seems clinically improved today. Likely nearing discharge. Discussed importance of outpatient follow-up and follow through with Rx and EGD recommendations.  Plan: 1. Pain and nause management per hospitalist 2. Follow-up as outpatient for EGD in 4 weeks (we will arrange through our office) 3. May need to consider repeat EUS as outpatient given elevation of LFTs 4. Avoid NSIADs, GERD trigger avoidance, avoid ETOH 5. Dexilant 60 mg po daily at discharge as previously recommended   Thank you for allowing [facility] participate in the care of DLittle Ishikawa DNP, AGNP-C Adult & Gerontological Nurse Practitioner [facility]    LOS: 1 day     08/26/2016, 7:49 AM

## 2016-08-26 NOTE — Discharge Planning (Signed)
Patient IV removed.  Patient discharge papers given, explained and educated.  No FU appts needed at this point.  No scripts.  RN assessment and VS revealed stability for DC to home. When ready, patient will be wheeled to front and family taking home via car.

## 2016-08-26 NOTE — Discharge Planning (Signed)
Asked Dr. If patient could have small pain med script, just till he gets in with pain clinic.  Dr. Sheppard EvensIndicated that he cannot.

## 2016-08-26 NOTE — Discharge Summary (Signed)
Physician Discharge Summary  Alex PernaDavid L Hoffman ZOX:[id number] DOB: January 12, 1961 DOA: 08/24/2016  PCP: No PCP Per Patient  Admit date: 08/24/2016 Discharge date: 08/26/2016  Admitted From: Home.  Disposition:  Home.   Recommendations for Outpatient Follow-up:  1. Follow up with PCP in 1-2 weeks 2. Please obtain BMP/CBC in one week 3. Please follow up on the following pending results:  Home Health: None Equipment/Devices: None.  Discharge Condition: Able to eat.  Still complained of abdominal pain, but it is chronic.  CODE STATUS: FULL CODE.  Diet recommendation:  As tolerated.  Low fat.   Brief/Interim Summary: Patient was admitted by Dr Sanda Kleinavid Ortiz on Aug 24, 2016 per recommendation of GI for obtructive disease and possible pancreatitis.   As per his H and P:  " Alex Hoffman is a 55 y.o. 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  Per patient, he has chronic abdominal pain. However, He has been having intense abdominal pain associated with moderate to severe nausea without emesis after eating lunch from [employer] on Thursday. He denies fever, chills, diarrhea, GU symptoms, but complains of frequent constipation due to his chronic use of narcotics for chronic pain. Per patient, apparently GI is planning to perform endoscopic studies soon, maybe on this admission and wants to hold anticoagulation first before doing it.   ED Course: The patient received IV fluids, IV analgesics and antiemetics. He states the he feels some relief from the medications.   Workup showed a lipase trending down from yesterday's 134  to 78 units today. AST was 129 and ALT was 135. They were 271 and 132 units respectively yesterday. WBC were 9.9, H&H was 14.1/41.3 yesterday and 11.1, H&H 16.4/ 46.8 when rechecked today.   Imaging: CT of the abdomen and pelvis with contrast was positive for mild diverticulosis without diverticulitis and did not show any acute findings.  Review of Systems: As per HPI otherwise 10 point review of systems negative.   HOSPITAL COURSE:  Patient has chronic abdominal pain, and chronic leg and back pain, and he had been on Methadone previously as high a dose as about 100mg  daily.  He subsequently went off of methadone, and use Oxycodone 10mg  every 4 hours.  He has elevated liver Fx tests modestly, and had an abdominal pelvic CT which showed no acute finding.  He was given IV Morphine, which he said did not help him with his pain.  He was given IV Dilaudid at 1mg  every 3 hours, and said it was not helping with his pain.  I tried to d/c his oral meds, and space closer his Dilaudid at Q2 hours, but lower dose to 0.5mg .  This did not help relieve his pain.  He was hungry, so he asked to have his diet advanced, and he was able to tolerate his diet without severe abdominal, nausea, or vomiting.  His Lipase was low at 78.  He was subsequently seen by GI, as it was determined that he may benefit getting an EGD with dlatation, but GI felt that it could and should be done as an outpatient.  He was told of this by the GI service, and with recommendation for him to be discharged, I  felt that he is now stable for discharged.  He will follow up with his pain specialist, and though he asked repeatedly that I write him more narcotics, I deferred his request to his PCP or pain management specialist.  Thank you and Good Day.     Discharge Diagnoses:  Principal Problem:   Acute pancreatitis Active Problems:   GERD (gastroesophageal reflux disease)   Abdominal pain   Elevated LFTs   Heterozygous factor V Leiden mutation  (HCC)   HTN (hypertension)   Chronic back pain    Discharge Instructions  Discharge Instructions    Diet - low sodium heart healthy    Complete by:  As directed    Discharge instructions    Complete by:  As directed    See your GI doctor as scheduled for you.  Please do not miss your appointment.  Follow up with your pain specialist to address your chronic pain syndrome.   Increase activity slowly    Complete by:  As directed        Medication List    STOP taking these medications   amoxicillin 875 MG tablet Commonly known as:  AMOXIL   methadone 10 MG tablet Commonly known as:  DOLOPHINE   multivitamin with minerals Tabs tablet   promethazine 25 MG tablet Commonly known as:  PHENERGAN   tiZANidine 4 MG capsule Commonly known as:  ZANAFLEX   zolpidem 5 MG tablet Commonly known as:  AMBIEN     TAKE these medications   amLODipine 10 MG tablet Commonly known as:  NORVASC Take 10 mg by mouth daily.   DEXILANT 60 MG capsule Generic drug:  dexlansoprazole Take 60 mg by mouth daily.   diazepam 5 MG tablet Commonly known as:  VALIUM Take 1 tablet (5 mg total) by mouth every 6 (six) hours as needed for anxiety.   furosemide 40 MG tablet Commonly known as:  LASIX Take 40 mg by mouth daily as needed for fluid.   naloxegol oxalate 25 MG Tabs tablet Commonly known as:  MOVANTIK Take 1 tablet (25 mg total) by mouth daily.   oxycodone 30 MG immediate release tablet Commonly known as:  ROXICODONE Take 30 mg by mouth every 4 (four) hours as needed for pain.   pantoprazole 40 MG tablet Commonly known as:  PROTONIX Take 1 tablet (40 mg total) by mouth daily.   warfarin 5 MG tablet Commonly known as:  COUMADIN Take 5-7.5 mg by mouth daily. Takes 5MG  on Monday-Friday. Takes 7.5MG  on Saturday and Sunday       Allergies  Allergen Reactions  . Reglan [Metoclopramide] Other (See Comments)    Muscle twitches.    Consultations:  GI.     Procedures/Studies: Dg Chest 2 View  Result Date: 08/24/2016 CLINICAL DATA:  Chest pain short of breath EXAM: CHEST  2 VIEW COMPARISON:  None. FINDINGS: Normal mediastinum and cardiac silhouette. Chronic central bronchitic markings. Normal pulmonary vasculature. No effusion, infiltrate, or pneumothorax. IMPRESSION: Bronchitic change.  No acute findings Electronically Signed   By: Genevive Bi M.D.   On: 08/24/2016 16:09   Ct Abdomen Pelvis W Contrast  Result Date: 08/24/2016 CLINICAL DATA:  Abdominal pain for 5 to 6 years. EXAM: CT ABDOMEN AND PELVIS WITH CONTRAST TECHNIQUE: Multidetector CT imaging of the abdomen and pelvis was performed using the standard protocol following bolus administration of intravenous contrast. CONTRAST:  ISOVUE-300 IOPAMIDOL (ISOVUE-300) INJECTION 61% COMPARISON:  CT 09/17/2015 FINDINGS: Lower chest: Lung bases are clear. Hepatobiliary:  No focal hepatic lesion. Postcholecystectomy. No biliary dilatation. Pancreas: Pancreas is normal. No ductal dilatation. No pancreatic inflammation. Spleen: Normal spleen Adrenals/urinary tract: Adrenal glands and kidneys are normal. The ureters and bladder normal. Stomach/Bowel: Stomach, small bowel, appendix, and cecum are normal. Some diverticula of the descending colon without acute inflammation Vascular/Lymphatic: Abdominal aorta is normal caliber with atherosclerotic calcification. There is no retroperitoneal or periportal lymphadenopathy. No pelvic lymphadenopathy. Reproductive: Prostate normal Other: No free fluid. Musculoskeletal: No aggressive osseous lesion. IMPRESSION: 1. No acute findings abdomen pelvis. 2. Mild diverticulosis without diverticulitis. 3. Normal appendix. Electronically Signed   By: Genevive Bi M.D.   On: 08/24/2016 16:46       Subjective: Ready to go home today.    Discharge Exam: Vitals:   08/26/16 0543 08/26/16 1034  BP: 128/74 122/76  Pulse: 62   Resp: 18   Temp: 98.2 F (36.8 C)     Vitals:   08/25/16 2053 08/26/16 0159 08/26/16 0543 08/26/16 1034  BP: (!) 160/82 125/72 128/74 122/76  Pulse: 69 (!) 58 62   Resp: 20 20 18    Temp: 98 F (36.7 C) 98.3 F (36.8 C) 98.2 F (36.8 C)   TempSrc: Oral Oral Oral   SpO2: 100% 99% 100%   Weight:      Height:        General: Pt is alert, awake, not in acute distress Cardiovascular: RRR, S1/S2 +, no rubs, no gallops Respiratory: CTA bilaterally, no wheezing, no rhonchi Abdominal: Soft, NT, ND, bowel sounds + Extremities: no edema, no cyanosis    The results of significant diagnostics from this hospitalization (including imaging, microbiology, ancillary and laboratory) are listed below for reference.     Basic Metabolic Panel:  Recent Labs Lab 08/23/16 1242 08/24/16 1543 08/25/16 0621  NA 134* 132* 135  K 4.2 3.6 3.9  CL 97* 97* 102  CO2 26 27 27   GLUCOSE 97 131* 93  BUN 7 7 6   CREATININE 0.72 0.95 0.82  CALCIUM 9.2 9.0 8.4*   Liver Function Tests:  Recent Labs Lab 08/23/16 1242 08/24/16 1543 08/25/16 0621  AST 271* 129* 114*  ALT 132* 135* 110*  ALKPHOS 177* 176* 142*  BILITOT 3.3* 1.2 1.9*  PROT 7.2 8.2* 6.8  ALBUMIN 4.0 4.0 3.4*    Recent Labs Lab 08/23/16 1242 08/24/16 1543 08/25/16 0621  LIPASE 134* 78* 55*   CBC:  Recent Labs Lab 08/24/16 1543 08/25/16 0621  WBC 11.1* 8.2  NEUTROABS 7.4 4.3  HGB 16.4 14.3  HCT 46.8 42.3  MCV 86.8 87.9  PLT 324 298   Urinalysis    Component Value Date/Time   COLORURINE YELLOW 01/28/2014 1504   APPEARANCEUR CLEAR 01/28/2014 1504   LABSPEC >1.030 (H) 01/28/2014 1504   PHURINE 5.5 01/28/2014 1504   GLUCOSEU NEGATIVE 01/28/2014 1504   HGBUR NEGATIVE 01/28/2014 1504   BILIRUBINUR NEGATIVE 01/28/2014 1504   KETONESUR NEGATIVE 01/28/2014 1504   PROTEINUR NEGATIVE 01/28/2014 1504   UROBILINOGEN 0.2 01/28/2014 1504   NITRITE NEGATIVE 01/28/2014 1504   LEUKOCYTESUR NEGATIVE 01/28/2014 1504    SIGNED:  Houston Siren, MD FACP [facility] 08/26/2016, 5:04 PM   If 7PM-7AM, please contact night-coverage [email or web address] Password [email or web address]

## 2016-08-26 NOTE — Care Management Important Message (Signed)
Important Message  Patient Details  Name: Rodell PernaDavid L Lobo MRN: [id number] Date of Birth: May 16, 1961   Medicare Important Message Given:  Yes    Tephanie Escorcia, Chrystine OilerSharley Diane, RN 08/26/2016, 8:29 AM

## 2016-08-29 ENCOUNTER — Encounter: Payer: Self-pay | Admitting: Gastroenterology

## 2016-08-29 ENCOUNTER — Telehealth: Payer: Self-pay | Admitting: Internal Medicine

## 2016-08-29 MED ORDER — LIDOCAINE VISCOUS 2 % MT SOLN: 15.0000 mL | Freq: Four times a day (QID) | OROMUCOSAL | 0 refills | Status: DC | PRN

## 2016-08-29 NOTE — Telephone Encounter (Signed)
See other phone note

## 2016-08-29 NOTE — Telephone Encounter (Signed)
Routing to AB- per Tobi BastosAnna we are waiting for clearance from cardiology to schedule outpt EGD. Tobi Bastosnna, pt is asking for you.

## 2016-08-29 NOTE — Telephone Encounter (Signed)
PATIENT CALLED AND IS STILL HAVING ABDOMINAL PAIN  FROM WHEN HE WAS IN THE HOSPITAL [phone]

## 2016-08-29 NOTE — Telephone Encounter (Signed)
Pt is aware of plan.   Alex BastosAnna, [facility].

## 2016-08-29 NOTE — Addendum Note (Signed)
Addended by: Gelene MinkBOONE, Richelle Glick W on: 08/29/2016 01:31 PM   Modules accepted: Orders

## 2016-08-29 NOTE — Telephone Encounter (Signed)
Still waiting on pain management consultation. I sent in viscous lidocaine to take every 6 hours prn abdominal discomfort. No narcotics at this time.   Alex Hoffman: what is status on holding Coumadin ? [facility] is what we are waiting on).

## 2016-08-29 NOTE — Telephone Encounter (Signed)
Patient called asking to speak with AB. I told him that she was aware that he had called earlier this morning and she was still seeing patients. He said that he was discharged from the hospital and he is trying to get pain management. Please call him at [phone]

## 2016-08-31 NOTE — Telephone Encounter (Signed)
LMOM to call.

## 2016-08-31 NOTE — Telephone Encounter (Signed)
Sp[facility] to [place]ie [facility] , she said she will be faxing the response shortly.

## 2016-08-31 NOTE — Telephone Encounter (Signed)
FAX received and placed on Ann's desk.  OK per Dr. Normand SloopAkintunde Akinleye to hold coumadine 3 days prior to procedure and bridge with Lovenox 90 mg bid 12 hours prior to procedure. ( They said to let them know if you wanted them to do the Lovenox Bridge).

## 2016-08-31 NOTE — Telephone Encounter (Signed)
Noted  

## 2016-08-31 NOTE — Telephone Encounter (Signed)
See other note. Needs office visit prior to scheduling.

## 2016-08-31 NOTE — Telephone Encounter (Signed)
Received note from Hematology. He may hold Coumadin for 3 days prior to procedure BUT NEEDS BRIDGING LOVENOX 90 mg BID.    Patient needs office visit prior to EGd/dilation due to recent hospital visit. I want to make sure everything is set up, and we can go over instructions at that time.

## 2016-09-01 NOTE — Telephone Encounter (Signed)
Pt returned call and has been scheduled to see Wynne DustEric Gill, NP on 09/05/2016 at 8:00 AM.

## 2016-09-05 ENCOUNTER — Telehealth: Payer: Self-pay | Admitting: Internal Medicine

## 2016-09-05 ENCOUNTER — Ambulatory Visit: Payer: Medicare Other | Admitting: Nurse Practitioner

## 2016-09-05 ENCOUNTER — Telehealth: Payer: Self-pay | Admitting: Nurse Practitioner

## 2016-09-05 NOTE — Telephone Encounter (Signed)
[phone]  Patient called to reschedule and I told him we would call him back because I did not know if we were going to reschedule him due to multiple no shows

## 2016-09-05 NOTE — Telephone Encounter (Signed)
Routing to Dr. Jena Gaussourk for advice

## 2016-09-05 NOTE — Telephone Encounter (Signed)
Patient was an no show for appointment. He did not follow through [facility] esophageal [facility], has been seen at multiple [facility] pain clinics, concerns for drug seeking behavior with our practice including multiple calls asking for narcotic pain medications, has been scheduled for upper endoscopy with dilation in May 2017 but has canceled EGD on at least two occasions. He has no-showed or cancelled 5 office visits (including his post-hospital OV today to set up another attempt at EGD).

## 2016-09-05 NOTE — Progress Notes (Deleted)
Referring Provider: No ref. provider found Primary Care Physician:  No PCP Per Patient Primary GI:  Dr. Jena Gaussourk  No chief complaint on file.   HPI:   Alex Hoffman is a 55 y.o. male who presents for post hospitalization follow-up and scheduling of procedure. The patient was last seen in our office 08/23/2016 for left upper quadrant pain and dysphagia. At that time it was noted he has a history of chronic abdominal pain, nausea, pancreatitis (with EUS in Aug 2012 with no evidence of chronic pancreatitis but had pancreatic duct stone requiring Subsequent ERCP with pancreatic duct stone removal in September 2012 and cholecystectomy 2013), GERD, fatty liver, constipation. EGD/dilation in June 2015 with empiric dilation. BPE July 2015 with focal narrowing of GE junction, obstruction of tablet. [facility] for a second opinion secondary to chronic abdominal pain; he was supposed to have a manometry for dysphagia but did not complete. He has been seen at multiple pain clinics. Concern for drug-seeking behaviors in the past through our office. He was scheduled for an EGD with dilation in May 2017 for persistent epigastric/RUQ pain/dysphagia but did not complete this. HE HAS CANCELLED on several prior occassions. Has not done well on Amitiza in the past with constipation, also describes insomnia with Linzess.  He was recently admitted to the hospital from 08/24/2016 through 08/26/2016 for acute on chronic abdominal pain with moderate to severe nausea without emesis after eating lunch at [employer]. His lipase initially was at 134 and trended down, AST/ALT 129/135 which is decreased from 271/132 the day prior to discharge. No anemia, white blood cell count 9.9. CT of the abdomen and pelvis positive for mild diverticulosis without diverticulitis and no other acute findings. He did quite well with diet advancement without worsening abdominal pain or nausea/vomiting. He was  discharged home in satisfactory condition recommended follow-up with GI, PCP, pain management. It was impressed upon him the importance of following up and following through with endoscopic evaluation.  Today he states   Past Medical History:  Diagnosis Date  . Abdominal distension   . Abdominal pain   . Abnormal EKG    hx left bundle branch block on ekg  . Anemia   . Arthritis   . Arthritis    lower back. Dr. [place]  . Arthritis pain   . Blood in urine    hx of, none current  . BPH (benign prostatic hyperplasia) 01/28/2014  . Bruises easily   . C. difficile colitis 01/2011?  Marland Kitchen. Chills   . Clotting disorder (HCC)   . Cystic thyroid nodule    left  . Difficulty urinating   . Dizziness   . Factor V Leiden (HCC) 07/19/2013  . Fever   . Frequent urination at night   . GERD (gastroesophageal reflux disease)   . GERD (gastroesophageal reflux disease)   . Headache(784.0)   . Hearing loss   . Heterozygous factor V Leiden mutation (HCC) 12/2010  . Heterozygous factor V Leiden mutation (HCC)   . HTN (hypertension)   . HTN (hypertension) 01/28/2014  . Joint pain   . Leg swelling   . Narcotic pain contract exists with [facility] 11/17/2014   Signed on 11/17/2014  . Nasal congestion   . Nausea & vomiting   . Numbness and tingling    both hands, and both legs and feet  . Palpitations   . Pancreatitis 05/2011   EUS Dr  Christella HartiganJacobs 06/09/11-> dilated main pancreatic duct in HOP, 2-3 filling defects distal pancreatic duct, peripancreatic fluid resolved  . Pancreatitis   . Pancreatitis 12/2011  . Pneumonia as child  . Prostatitis, chronic   . Pulmonary embolism (HCC) 12/2010  . Rectal pain   . Sleep apnea    STOPBANG=5  . Weakness   . Weight loss     Past Surgical History:  Procedure Laterality Date  . BIOPSY N/A 02/13/2014   Procedure: BIOPSY;  Surgeon: Corbin Adeobert M Rourk, MD;  Location: [facility];  Service: Endoscopy;  Laterality: N/A;  . CARDIAC CATHETERIZATION   10/15/14  . CHOLECYSTECTOMY  01/25/2012   Procedure: LAPAROSCOPIC CHOLECYSTECTOMY WITH INTRAOPERATIVE CHOLANGIOGRAM;  Surgeon: Robyne AskewPaul S Toth III, MD;  Location: [facility];  Service: General;  Laterality: N/A;  . COLONOSCOPY  01/2011   Dr. Zenon [facility]Brian Beachum at [facility]-->normal  . COLONOSCOPY WITH PROPOFOL  11/22/2012   ZOX:[id number]:friable anal rectum/fissure not identified although he certainly could have a chronic anal fissure   . CYSTOSCOPY WITH HYDRODISTENSION AND BIOPSY  01/25/2012   Procedure: CYSTOSCOPY/BIOPSY/HYDRODISTENSION;  Surgeon: Anner CreteJohn J Wrenn, MD;  Location: [facility];  Service: Urology;  Laterality: N/A;  Cystoscopy and Hydrodistension of the Bladder  . ERCP  07/2011   Dr. Logan [facility]ans [facility]-> pancreatic enterotomy, pancreatic duct stones removed, bile duct not manipulated, no stents  . ESOPHAGEAL DILATION N/A 04/10/2014   UJW:[id number]:patulous EG junction otherwise normal EGD  . ESOPHAGOGASTRODUODENOSCOPY  01/2011   Dr. Zenon MayoBrian Beachum at [facility]-->bile reflux in stomach-->patient c/o inadequate conscious sedation (Fentanyl 100mg /Versed 8mg )  . ESOPHAGOGASTRODUODENOSCOPY (EGD) WITH PROPOFOL N/A 02/13/2014   NFA:[id number]:Incomplete EGD as described above s/p bx (mild changes of reflux on esophageal bx  . ESOPHAGOGASTRODUODENOSCOPY (EGD) WITH PROPOFOL N/A 04/10/2014   Dr. Jena Gaussourk: patulous EG junction, s/p [address] dilation empirically  . FEMUR FRACTURE SURGERY  age 81   right leg  . KNEE SURGERY  1980's   arthroscopy, left knee  . NECK SURGERY  1999   C5/C6 fusion, limited neck mobility especially turning to left  . NERVE SURGERY  1980's   right thumb    Current Outpatient Prescriptions  Medication Sig Dispense Refill  . amLODipine (NORVASC) 10 MG tablet Take 10 mg by mouth daily.    Marland Kitchen. dexlansoprazole (DEXILANT) 60 MG capsule Take 60 mg by mouth daily.    . diazepam (VALIUM) 5 MG tablet Take 1 tablet (5 mg total) by mouth every 6 (six) hours as needed for anxiety. 40 tablet 1  . furosemide (LASIX) 40 MG tablet Take 40 mg by mouth  daily as needed for fluid.     Marland Kitchen. lidocaine (XYLOCAINE) 2 % solution Use as directed 15 mLs in the mouth or throat every 6 (six) hours as needed for mouth pain. 100 mL 0  . naloxegol oxalate (MOVANTIK) 25 MG TABS tablet Take 1 tablet (25 mg total) by mouth daily. (Patient not taking: Reported on 08/24/2016) 14 tablet 0  . oxycodone (ROXICODONE) 30 MG immediate release tablet Take 30 mg by mouth every 4 (four) hours as needed for pain.    . pantoprazole (PROTONIX) 40 MG tablet Take 1 tablet (40 mg total) by mouth daily. (Patient not taking: Reported on 08/24/2016) 90 tablet 3  . warfarin (COUMADIN) 5 MG tablet Take 5-7.5 mg by mouth daily. Takes 5MG  on Monday-Friday. Takes 7.5MG  on Saturday and Sunday     No current facility-administered medications for this visit.     Allergies as of 09/05/2016 - Review Complete 08/25/2016  Allergen Reaction Noted  . Reglan [metoclopramide] Other (See Comments) 08/09/2013    Family History  Problem Relation Age of Onset  . Hypertension Mother   . Hypertension Father   . Cancer Maternal Aunt     colon/pancreas/lung/stomach  . GI problems Brother   . GI problems      stomach and lung cancer, aunts/uncles  . Heart attack Brother     before age of 42  . Stroke Brother   . Heart disease Maternal Uncle   . Cancer Maternal Grandfather     lung  . Colon cancer Neg Hx   . Liver disease Neg Hx     Social History   Social History  . Marital status: Married    Spouse name: N/A  . Number of children: 2  . Years of education: N/A   Occupational History  .      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 Smoking status: Current Some Day Smoker    Packs/day: 1.00    Years: 35.00    Types: Cigarettes    Last attempt to quit: 12/21/2012  . Smokeless tobacco: Never Used     Comment: About 2 cigarettes per day.   . Alcohol use No     Comment: No alcohol since January 27, 2011  .  Drug use: No     Comment: hx of cocaine use; none since last summer  . Sexual activity: Not on file   Other Topics Concern  . Not on file   Social History Narrative  . No narrative on file    Review of Systems: General: Negative for anorexia, weight loss, fever, chills, fatigue, weakness. Eyes: Negative for vision changes.  ENT: Negative for hoarseness, difficulty swallowing , nasal congestion. CV: Negative for chest pain, angina, palpitations, dyspnea on exertion, peripheral edema.  Respiratory: Negative for dyspnea at rest, dyspnea on exertion, cough, sputum, wheezing.  GI: See history of present illness. GU:  Negative for dysuria, hematuria, urinary incontinence, urinary frequency, nocturnal urination.  MS: Negative for joint pain, low back pain.  Derm: Negative for rash or itching.  Neuro: Negative for weakness, abnormal sensation, seizure, frequent headaches, memory loss, confusion.  Psych: Negative for anxiety, depression, suicidal ideation, hallucinations.  Endo: Negative for unusual weight change.  Heme: Negative for bruising or bleeding. Allergy: Negative for rash or hives.   Physical Exam: There were no vitals taken for this visit. General:   Alert and oriented. Pleasant and cooperative. Well-nourished and well-developed.  Head:  Normocephalic and atraumatic. Eyes:  Without icterus, sclera clear and conjunctiva pink.  Ears:  Normal auditory acuity. Mouth:  No deformity or lesions, oral mucosa pink.  Throat/Neck:  Supple, without mass or thyromegaly. Cardiovascular:  S1, S2 present without murmurs appreciated. Normal pulses noted. Extremities without clubbing or edema. Respiratory:  Clear to auscultation bilaterally. No wheezes, rales, or rhonchi. No distress.  Gastrointestinal:  +BS, soft, non-tender and non-distended. No HSM noted. No guarding or rebound. No masses appreciated.  Rectal:  Deferred  Musculoskalatal:  Symmetrical without gross deformities. Normal  posture. Skin:  Intact without significant lesions or rashes. Neurologic:  Alert and oriented x4;  grossly normal neurologically. Psych:  Alert and cooperative. Normal mood and affect. Heme/Lymph/Immune: No significant cervical adenopathy. No excessive bruising noted.    09/05/2016 7:49 AM   Disclaimer: This note was dictated with voice recognition software. Similar sounding words  can inadvertently be transcribed and may not be corrected upon review.

## 2016-09-06 ENCOUNTER — Encounter: Payer: Self-pay | Admitting: General Practice

## 2016-09-06 NOTE — Telephone Encounter (Signed)
Waiting on advice from Dr. Jena Gaussourk regarding discharge from practice

## 2016-09-06 NOTE — Telephone Encounter (Signed)
Doctor-patient relationship is no longer productive. Discharge.

## 2016-09-06 NOTE — Telephone Encounter (Signed)
Discharge letter sent.

## 2016-09-29 ENCOUNTER — Ambulatory Visit: Payer: Medicare Other | Admitting: Gastroenterology

## 2016-10-14 DIAGNOSIS — M79605 Pain in left leg: Secondary | ICD-10-CM | POA: Diagnosis not present

## 2016-10-14 DIAGNOSIS — I739 Peripheral vascular disease, unspecified: Secondary | ICD-10-CM | POA: Diagnosis not present

## 2016-10-14 DIAGNOSIS — M79604 Pain in right leg: Secondary | ICD-10-CM | POA: Diagnosis not present

## 2016-10-17 DIAGNOSIS — R609 Edema, unspecified: Secondary | ICD-10-CM | POA: Diagnosis not present

## 2016-10-19 DIAGNOSIS — R079 Chest pain, unspecified: Secondary | ICD-10-CM | POA: Diagnosis not present

## 2016-11-01 DIAGNOSIS — I10 Essential (primary) hypertension: Secondary | ICD-10-CM | POA: Diagnosis not present

## 2016-11-01 DIAGNOSIS — E785 Hyperlipidemia, unspecified: Secondary | ICD-10-CM | POA: Diagnosis not present

## 2016-11-01 DIAGNOSIS — I739 Peripheral vascular disease, unspecified: Secondary | ICD-10-CM | POA: Diagnosis not present

## 2016-11-01 DIAGNOSIS — E559 Vitamin D deficiency, unspecified: Secondary | ICD-10-CM | POA: Diagnosis not present

## 2016-11-01 DIAGNOSIS — F419 Anxiety disorder, unspecified: Secondary | ICD-10-CM | POA: Diagnosis not present

## 2016-11-01 DIAGNOSIS — G894 Chronic pain syndrome: Secondary | ICD-10-CM | POA: Diagnosis not present

## 2016-11-01 DIAGNOSIS — I82599 Chronic embolism and thrombosis of other specified deep vein of unspecified lower extremity: Secondary | ICD-10-CM | POA: Diagnosis not present

## 2016-11-03 DIAGNOSIS — I872 Venous insufficiency (chronic) (peripheral): Secondary | ICD-10-CM | POA: Diagnosis not present

## 2016-11-03 DIAGNOSIS — I10 Essential (primary) hypertension: Secondary | ICD-10-CM | POA: Diagnosis not present

## 2016-11-03 DIAGNOSIS — I739 Peripheral vascular disease, unspecified: Secondary | ICD-10-CM | POA: Diagnosis not present

## 2016-11-03 DIAGNOSIS — R609 Edema, unspecified: Secondary | ICD-10-CM | POA: Diagnosis not present

## 2016-11-03 DIAGNOSIS — I447 Left bundle-branch block, unspecified: Secondary | ICD-10-CM | POA: Diagnosis not present

## 2016-11-03 DIAGNOSIS — R079 Chest pain, unspecified: Secondary | ICD-10-CM | POA: Diagnosis not present

## 2016-11-03 DIAGNOSIS — I8290 Acute embolism and thrombosis of unspecified vein: Secondary | ICD-10-CM | POA: Diagnosis not present

## 2016-11-03 DIAGNOSIS — E785 Hyperlipidemia, unspecified: Secondary | ICD-10-CM | POA: Diagnosis not present

## 2016-11-08 DIAGNOSIS — R002 Palpitations: Secondary | ICD-10-CM | POA: Diagnosis not present

## 2016-11-10 DIAGNOSIS — R002 Palpitations: Secondary | ICD-10-CM | POA: Diagnosis not present

## 2016-12-19 DIAGNOSIS — M79605 Pain in left leg: Secondary | ICD-10-CM | POA: Diagnosis not present

## 2016-12-19 DIAGNOSIS — I739 Peripheral vascular disease, unspecified: Secondary | ICD-10-CM | POA: Diagnosis not present

## 2016-12-19 DIAGNOSIS — M79604 Pain in right leg: Secondary | ICD-10-CM | POA: Diagnosis not present

## 2017-04-28 ENCOUNTER — Inpatient Hospital Stay (HOSPITAL_COMMUNITY): Payer: Medicare Other

## 2017-04-28 ENCOUNTER — Encounter (HOSPITAL_COMMUNITY): Payer: Self-pay | Admitting: Physician Assistant

## 2017-04-28 ENCOUNTER — Inpatient Hospital Stay (HOSPITAL_COMMUNITY)
Admission: AD | Admit: 2017-04-28 | Discharge: 2017-05-03 | DRG: 440 | Disposition: A | Discharge status: Home / Self Care | Payer: Medicare Other | Source: Other Acute Inpatient Hospital | Attending: Internal Medicine | Admitting: Internal Medicine

## 2017-04-28 DIAGNOSIS — R1013 Epigastric pain: Secondary | ICD-10-CM | POA: Diagnosis not present

## 2017-04-28 DIAGNOSIS — Z79899 Other long term (current) drug therapy: Secondary | ICD-10-CM | POA: Diagnosis not present

## 2017-04-28 DIAGNOSIS — F17200 Nicotine dependence, unspecified, uncomplicated: Secondary | ICD-10-CM | POA: Diagnosis present

## 2017-04-28 DIAGNOSIS — K831 Obstruction of bile duct: Secondary | ICD-10-CM

## 2017-04-28 DIAGNOSIS — I739 Peripheral vascular disease, unspecified: Secondary | ICD-10-CM | POA: Diagnosis present

## 2017-04-28 DIAGNOSIS — I119 Hypertensive heart disease without heart failure: Secondary | ICD-10-CM | POA: Diagnosis present

## 2017-04-28 DIAGNOSIS — Z765 Malingerer [conscious simulation]: Secondary | ICD-10-CM

## 2017-04-28 DIAGNOSIS — I83229 Varicose veins of left lower extremity with both ulcer of unspecified site and inflammation: Secondary | ICD-10-CM

## 2017-04-28 DIAGNOSIS — R935 Abnormal findings on diagnostic imaging of other abdominal regions, including retroperitoneum: Secondary | ICD-10-CM

## 2017-04-28 DIAGNOSIS — K210 Gastro-esophageal reflux disease with esophagitis: Secondary | ICD-10-CM

## 2017-04-28 DIAGNOSIS — K8500 Idiopathic acute pancreatitis without necrosis or infection: Secondary | ICD-10-CM

## 2017-04-28 DIAGNOSIS — IMO0001 Reserved for inherently not codable concepts without codable children: Secondary | ICD-10-CM | POA: Diagnosis present

## 2017-04-28 DIAGNOSIS — R740 Nonspecific elevation of levels of transaminase and lactic acid dehydrogenase [LDH]: Secondary | ICD-10-CM | POA: Diagnosis not present

## 2017-04-28 DIAGNOSIS — K219 Gastro-esophageal reflux disease without esophagitis: Secondary | ICD-10-CM | POA: Diagnosis present

## 2017-04-28 DIAGNOSIS — F17210 Nicotine dependence, cigarettes, uncomplicated: Secondary | ICD-10-CM | POA: Diagnosis present

## 2017-04-28 DIAGNOSIS — K859 Acute pancreatitis without necrosis or infection, unspecified: Secondary | ICD-10-CM | POA: Diagnosis present

## 2017-04-28 DIAGNOSIS — I517 Cardiomegaly: Secondary | ICD-10-CM | POA: Diagnosis present

## 2017-04-28 DIAGNOSIS — R7989 Other specified abnormal findings of blood chemistry: Secondary | ICD-10-CM | POA: Diagnosis present

## 2017-04-28 DIAGNOSIS — I83219 Varicose veins of right lower extremity with both ulcer of unspecified site and inflammation: Secondary | ICD-10-CM | POA: Diagnosis present

## 2017-04-28 DIAGNOSIS — Z7901 Long term (current) use of anticoagulants: Secondary | ICD-10-CM

## 2017-04-28 DIAGNOSIS — R0602 Shortness of breath: Secondary | ICD-10-CM

## 2017-04-28 DIAGNOSIS — R17 Unspecified jaundice: Secondary | ICD-10-CM | POA: Diagnosis not present

## 2017-04-28 DIAGNOSIS — R1010 Upper abdominal pain, unspecified: Secondary | ICD-10-CM | POA: Diagnosis not present

## 2017-04-28 DIAGNOSIS — K852 Alcohol induced acute pancreatitis without necrosis or infection: Secondary | ICD-10-CM | POA: Diagnosis not present

## 2017-04-28 DIAGNOSIS — R109 Unspecified abdominal pain: Secondary | ICD-10-CM | POA: Diagnosis present

## 2017-04-28 DIAGNOSIS — K8020 Calculus of gallbladder without cholecystitis without obstruction: Secondary | ICD-10-CM

## 2017-04-28 DIAGNOSIS — I10 Essential (primary) hypertension: Secondary | ICD-10-CM | POA: Diagnosis present

## 2017-04-28 DIAGNOSIS — K851 Biliary acute pancreatitis without necrosis or infection: Secondary | ICD-10-CM | POA: Diagnosis not present

## 2017-04-28 DIAGNOSIS — R110 Nausea: Secondary | ICD-10-CM | POA: Diagnosis present

## 2017-04-28 DIAGNOSIS — G894 Chronic pain syndrome: Secondary | ICD-10-CM | POA: Diagnosis present

## 2017-04-28 DIAGNOSIS — L97929 Non-pressure chronic ulcer of unspecified part of left lower leg with unspecified severity: Secondary | ICD-10-CM

## 2017-04-28 DIAGNOSIS — Z79891 Long term (current) use of opiate analgesic: Secondary | ICD-10-CM

## 2017-04-28 DIAGNOSIS — K8689 Other specified diseases of pancreas: Secondary | ICD-10-CM

## 2017-04-28 DIAGNOSIS — Z716 Tobacco abuse counseling: Secondary | ICD-10-CM | POA: Diagnosis not present

## 2017-04-28 DIAGNOSIS — R945 Abnormal results of liver function studies: Secondary | ICD-10-CM

## 2017-04-28 DIAGNOSIS — L97919 Non-pressure chronic ulcer of unspecified part of right lower leg with unspecified severity: Secondary | ICD-10-CM

## 2017-04-28 LAB — COMPREHENSIVE METABOLIC PANEL
ALK PHOS: 214 U/L — AB (ref 38–126)
ALT: 93 U/L — AB (ref 17–63)
AST: 99 U/L — ABNORMAL HIGH (ref 15–41)
Albumin: 2.7 g/dL — ABNORMAL LOW (ref 3.5–5.0)
Anion gap: 6 (ref 5–15)
BUN: 12 mg/dL (ref 6–20)
CALCIUM: 8.5 mg/dL — AB (ref 8.9–10.3)
CO2: 24 mmol/L (ref 22–32)
CREATININE: 0.84 mg/dL (ref 0.61–1.24)
Chloride: 105 mmol/L (ref 101–111)
Glucose, Bld: 87 mg/dL (ref 65–99)
Potassium: 3.4 mmol/L — ABNORMAL LOW (ref 3.5–5.1)
Sodium: 135 mmol/L (ref 135–145)
Total Bilirubin: 8.5 mg/dL — ABNORMAL HIGH (ref 0.3–1.2)
Total Protein: 6.2 g/dL — ABNORMAL LOW (ref 6.5–8.1)

## 2017-04-28 LAB — HEPARIN LEVEL (UNFRACTIONATED): HEPARIN UNFRACTIONATED: 0.15 [IU]/mL — AB (ref 0.30–0.70)

## 2017-04-28 LAB — CBC
HEMATOCRIT: 36.6 % — AB (ref 39.0–52.0)
HEMOGLOBIN: 12.2 g/dL — AB (ref 13.0–17.0)
MCH: 30.3 pg (ref 26.0–34.0)
MCHC: 33.3 g/dL (ref 30.0–36.0)
MCV: 91 fL (ref 78.0–100.0)
Platelets: 260 10*3/uL (ref 150–400)
RBC: 4.02 MIL/uL — AB (ref 4.22–5.81)
RDW: 14.3 % (ref 11.5–15.5)
WBC: 12.1 10*3/uL — ABNORMAL HIGH (ref 4.0–10.5)

## 2017-04-28 LAB — APTT
APTT: 34 s (ref 24–36)
APTT: 46 s — AB (ref 24–36)

## 2017-04-28 LAB — PROTIME-INR
INR: 1.22
Prothrombin Time: 15.4 seconds — ABNORMAL HIGH (ref 11.4–15.2)

## 2017-04-28 LAB — MRSA PCR SCREENING: MRSA by PCR: NEGATIVE

## 2017-04-28 MED ORDER — HYDROCODONE-ACETAMINOPHEN 5-325 MG PO TABS: 1.0000 | ORAL_TABLET | ORAL | Status: DC | PRN

## 2017-04-28 MED ORDER — SODIUM CHLORIDE 0.9 % IV SOLN
3.0000 g | Freq: Three times a day (TID) | INTRAVENOUS | Status: DC
  Administered 2017-04-28 – 2017-04-30 (×5): 3 g via INTRAVENOUS
  Filled 2017-04-28 (×7): qty 3

## 2017-04-28 MED ORDER — SODIUM CHLORIDE 0.9% FLUSH
3.0000 mL | Freq: Two times a day (BID) | INTRAVENOUS | Status: DC
  Administered 2017-04-28 – 2017-05-02 (×5): 3 mL via INTRAVENOUS

## 2017-04-28 MED ORDER — AMPICILLIN-SULBACTAM SODIUM 3 (2-1) G IJ SOLR
3.0000 g | Freq: Once | INTRAMUSCULAR | Status: AC
  Administered 2017-04-28: 3 g via INTRAVENOUS
  Filled 2017-04-28: qty 3

## 2017-04-28 MED ORDER — ZOLPIDEM TARTRATE 5 MG PO TABS
5.0000 mg | ORAL_TABLET | Freq: Once | ORAL | Status: AC
  Administered 2017-04-28: 5 mg via ORAL
  Filled 2017-04-28: qty 1

## 2017-04-28 MED ORDER — HEPARIN (PORCINE) IN NACL 100-0.45 UNIT/ML-% IJ SOLN
2000.0000 [IU]/h | INTRAMUSCULAR | Status: DC
  Administered 2017-04-28: 1450 [IU]/h via INTRAVENOUS
  Administered 2017-04-29 (×2): 2000 [IU]/h via INTRAVENOUS
  Filled 2017-04-28 (×4): qty 250

## 2017-04-28 MED ORDER — ADULT MULTIVITAMIN W/MINERALS CH
1.0000 | ORAL_TABLET | Freq: Every day | ORAL | Status: DC
  Administered 2017-04-28 – 2017-05-03 (×6): 1 via ORAL
  Filled 2017-04-28 (×6): qty 1

## 2017-04-28 MED ORDER — MORPHINE SULFATE ER 30 MG PO TBCR
30.0000 mg | EXTENDED_RELEASE_TABLET | Freq: Two times a day (BID) | ORAL | Status: DC
  Administered 2017-04-28 – 2017-05-01 (×7): 30 mg via ORAL
  Filled 2017-04-28 (×2): qty 1
  Filled 2017-04-28: qty 2
  Filled 2017-04-28 (×4): qty 1

## 2017-04-28 MED ORDER — PANTOPRAZOLE SODIUM 40 MG PO TBEC
40.0000 mg | DELAYED_RELEASE_TABLET | Freq: Every day | ORAL | Status: DC
  Administered 2017-04-28 – 2017-05-03 (×6): 40 mg via ORAL
  Filled 2017-04-28 (×6): qty 1

## 2017-04-28 MED ORDER — ONDANSETRON HCL 4 MG/2ML IJ SOLN: 4.0000 mg | Freq: Four times a day (QID) | INTRAMUSCULAR | Status: DC | PRN

## 2017-04-28 MED ORDER — ALPRAZOLAM 0.25 MG PO TABS
0.2500 mg | ORAL_TABLET | Freq: Two times a day (BID) | ORAL | Status: DC | PRN
Start: 2017-04-28 — End: 2017-04-28

## 2017-04-28 MED ORDER — BISACODYL 10 MG RE SUPP: 10.0000 mg | Freq: Every day | RECTAL | Status: DC | PRN

## 2017-04-28 MED ORDER — DIAZEPAM 5 MG PO TABS: 5.0000 mg | ORAL_TABLET | Freq: Four times a day (QID) | ORAL | Status: DC | PRN

## 2017-04-28 MED ORDER — VITAMIN B-1 100 MG PO TABS
100.0000 mg | ORAL_TABLET | Freq: Every day | ORAL | Status: DC
  Administered 2017-04-28 – 2017-05-03 (×6): 100 mg via ORAL
  Filled 2017-04-28 (×6): qty 1

## 2017-04-28 MED ORDER — HEPARIN (PORCINE) IN NACL 100-0.45 UNIT/ML-% IJ SOLN: 1450.0000 [IU]/h | INTRAMUSCULAR | Status: DC

## 2017-04-28 MED ORDER — LACTATED RINGERS IV SOLN
INTRAVENOUS | Status: DC
  Administered 2017-04-28 – 2017-05-02 (×10): via INTRAVENOUS

## 2017-04-28 MED ORDER — SODIUM CHLORIDE 0.9 % IV SOLN
INTRAVENOUS | Status: DC
  Administered 2017-04-28: 13:00:00 via INTRAVENOUS

## 2017-04-28 MED ORDER — NICOTINE 21 MG/24HR TD PT24
21.0000 mg | MEDICATED_PATCH | Freq: Every day | TRANSDERMAL | Status: DC
  Administered 2017-04-28 – 2017-05-03 (×6): 21 mg via TRANSDERMAL
  Filled 2017-04-28 (×6): qty 1

## 2017-04-28 MED ORDER — THIAMINE HCL 100 MG/ML IJ SOLN: 100.0000 mg | Freq: Every day | INTRAMUSCULAR | Status: DC

## 2017-04-28 MED ORDER — FOLIC ACID 1 MG PO TABS
1.0000 mg | ORAL_TABLET | Freq: Every day | ORAL | Status: DC
  Administered 2017-04-28 – 2017-05-03 (×6): 1 mg via ORAL
  Filled 2017-04-28 (×6): qty 1

## 2017-04-28 MED ORDER — SODIUM CHLORIDE 0.9 % IV BOLUS (SEPSIS): 1000.0000 mL | INTRAVENOUS | Status: DC | PRN

## 2017-04-28 MED ORDER — LORAZEPAM 2 MG/ML IJ SOLN: 1.0000 mg | Freq: Four times a day (QID) | INTRAMUSCULAR | Status: AC | PRN

## 2017-04-28 MED ORDER — ONDANSETRON HCL 4 MG PO TABS: 4.0000 mg | ORAL_TABLET | Freq: Four times a day (QID) | ORAL | Status: DC | PRN

## 2017-04-28 MED ORDER — LORAZEPAM 1 MG PO TABS: 1.0000 mg | ORAL_TABLET | Freq: Four times a day (QID) | ORAL | Status: AC | PRN

## 2017-04-28 MED ORDER — AMLODIPINE BESYLATE 10 MG PO TABS
10.0000 mg | ORAL_TABLET | Freq: Every day | ORAL | Status: DC
  Administered 2017-04-28 – 2017-05-03 (×6): 10 mg via ORAL
  Filled 2017-04-28 (×6): qty 1

## 2017-04-28 NOTE — H&P (Addendum)
TRH H&P   Patient Demographics:    Alex Hoffman, is a 56 y.o. male  MRN: [id number]   DOB - [birth year]  Admit Date - 04/28/2017  Outpatient Primary MD for the patient is Patient, No Pcp Per  Outpatient Specialists: GI     Patient coming from: [facility]  Chief complaint. Abdominal pain.    HPI:    Alex Hoffman  is a 56 y.o. 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  In the [facility] ER workup which included CT scan and ultrasound abdomen was nonacute, CBD was 1.2 mm, lipase was 3100, AST ALT were in 200s, total bilirubin was around 12, white count was in 21,000 range, he was diagnosed with acute pancreatitis and transferred here. [facility] was consulted over the phone and he was [facility] to [facility] for further care.  Patient except above review of systems has a negative review of system, currently appears to be in no distress, already demanding IV pain medications but appears comfortable.    Review of systems:    In addition to the HPI above,   No  Fever-chills, No Headache, No changes with Vision or hearing, No problems swallowing food or Liquids, No Chest pain, Cough or Shortness of Breath, GI symptoms as above, No Blood in stool or Urine, No dysuria, No new skin rashes or bruises, No new joints pains-aches,  No new weakness, tingling, numbness in any extremity, No recent weight gain or loss, No polyuria, polydypsia or polyphagia, No significant Mental Stressors.  A full 10 point Review of Systems was done, except as stated above, all other Review of Systems were negative.   With Past History of the following :    Past Medical History:  Diagnosis Date  . Abdominal distension   .  Abdominal pain   . Abnormal EKG    hx left bundle branch block on ekg  . Anemia   . Arthritis   . Arthritis    lower back. Dr. Effie Berkshire in [place]  . Arthritis pain   . Blood in urine    hx of, none current  . BPH (benign prostatic hyperplasia) 01/28/2014  . Bruises easily   . C. difficile colitis 01/2011?  Marland Kitchen Chills   . Clotting disorder ([place])   . Cystic thyroid nodule    left  . Difficulty urinating   . Dizziness   . Factor V Leiden ([place]) 07/19/2013  . Fever   . Frequent urination at night   . GERD (gastroesophageal reflux disease)   . GERD (gastroesophageal reflux disease)   . Headache(784.0)   . Hearing loss   . Heterozygous factor V Leiden mutation ([place]) 12/2010  . Heterozygous factor V Leiden mutation ([place])   . HTN (hypertension)   . HTN (hypertension) 01/28/2014  . Joint pain   . Leg swelling   . Narcotic pain contract exists with [place] 11/17/2014   Signed on 11/17/2014  . Nasal congestion   . Nausea & vomiting   . Numbness and tingling    both hands, and both legs and feet  . Palpitations   . Pancreatitis 05/2011   [facility] Dr Ardis Hughs 06/09/11-> dilated main pancreatic duct in HOP, 2-3 filling defects distal pancreatic duct, peripancreatic fluid resolved  . Pancreatitis   . Pancreatitis 12/2011  . Pneumonia  as child  . Prostatitis, chronic   . Pulmonary embolism ([place]) 12/2010  . Rectal pain   . Sleep apnea    STOPBANG=5  . Weakness   . Weight loss       Past Surgical History:  Procedure Laterality Date  . BIOPSY N/A 02/13/2014   Procedure: BIOPSY;  Surgeon: Daneil Dolin, MD;  Location: [facility];  Service: Endoscopy;  Laterality: N/A;  . CARDIAC CATHETERIZATION  10/15/14  . CHOLECYSTECTOMY  01/25/2012   Procedure: LAPAROSCOPIC CHOLECYSTECTOMY WITH INTRAOPERATIVE CHOLANGIOGRAM;  Surgeon: Merrie Roof, MD;  Location: [facility];  Service: General;  Laterality: N/A;  . COLONOSCOPY  01/2011   Dr. Annitta Jersey at [facility]-->normal  . 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 CYSTOSCOPY WITH HYDRODISTENSION AND BIOPSY  01/25/2012   Procedure: CYSTOSCOPY/BIOPSY/HYDRODISTENSION;  Surgeon: Malka So, MD;  Location: [facility];  Service: Urology;  Laterality: N/A;  Cystoscopy and Hydrodistension of the Bladder  . ERCP  07/2011   Dr. Amalia Hailey [facility]-> pancreatic enterotomy, pancreatic duct stones removed, bile duct not manipulated, no stents  . ESOPHAGEAL DILATION N/A 04/10/2014   FIE:PPIRJJOA EG junction otherwise normal EGD  . ESOPHAGOGASTRODUODENOSCOPY  01/2011   Dr. Annitta Jersey at [facility]-->bile reflux in stomach-->patient c/o inadequate conscious sedation (Fentanyl 115m/Versed 85m  . ESOPHAGOGASTRODUODENOSCOPY (EGD) WITH PROPOFOL N/A 02/13/2014   RMCZY:[id number] as described above s/p bx (mild changes of reflux on esophageal bx  . ESOPHAGOGASTRODUODENOSCOPY (EGD) WITH PROPOFOL N/A 04/10/2014   Dr. RoGala Romneypatulous EG junction, s/p 5659 Maloney dilation empirically  . FEMUR FRACTURE SURGERY  age [age] right leg  . KNEE SURGERY  1980's   arthroscopy, left knee  . NECK SURGERY  1999   C5/C6 fusion, limited neck mobility especially turning to left  . NERVE SURGERY  1980's   right thumb  Social  History:     Social History  Substance Use Topics  . Smoking status: Current Some Day Smoker    Packs/day: 1.00    Years: 35.00    Types: Cigarettes    Last attempt to quit: 12/21/2012  . Smokeless tobacco: Never Used     Comment: About 2 cigarettes per day.   . Alcohol use No     Comment: No alcohol since January 27, 2011         Family History :     Family History  Problem Relation Age of Onset  . Hypertension Mother   . Hypertension Father   . Cancer Maternal Aunt        colon/pancreas/lung/stomach  . GI problems Brother   . GI problems Unknown        stomach and lung cancer, aunts/uncles  . Heart attack Brother        before age of 47  . Stroke Brother   . Heart disease Maternal Uncle   . Cancer Maternal Grandfather        lung  . Colon cancer Neg Hx   . Liver disease Neg Hx        Home Medications:   Prior to Admission medications   Medication Sig Start Date End Date Taking? Authorizing Provider  ALPRAZolam (XANAX) 0.25 MG tablet Take 0.25 mg by mouth 2 (two) times daily as needed for anxiety.  03/29/17  Yes [provider]  amLODipine (NORVASC) 10 MG tablet Take 10 mg by mouth daily.   Yes [provider]  dexlansoprazole (DEXILANT) 60 MG capsule Take 60 mg by mouth daily.   Yes [provider]  furosemide (LASIX) 40 MG tablet Take 40 mg by mouth daily as needed for fluid.  01/19/15  Yes [provider]  oxycodone (ROXICODONE) 30 MG immediate release tablet Take 30 mg by mouth every 4 (four) hours as needed for pain.   Yes [provider]  pantoprazole (PROTONIX) 40 MG tablet Take 1 tablet (40 mg total) by mouth daily. 08/23/16  Yes Annitta Needs, NP  rivaroxaban (XARELTO) 20 MG TABS tablet Take 20 mg by mouth daily.   Yes [provider]     Allergies:     Allergies  Allergen Reactions  . Reglan [Metoclopramide] Other (See Comments)    Muscle twitches.     Physical Exam:   Vitals  Blood pressure  129/81, pulse 62, temperature 98.1 F (36.7 C), temperature source Oral, resp. rate 12, height 6' (1.829 m), weight 92.7 kg (204 lb 5.9 oz), SpO2 100 %.   1. General Middle-aged white male lying in bed in NAD,    2. Normal affect and insight, Not Suicidal or Homicidal, Awake Alert, Oriented X 3.  3. No F.N deficits, ALL C.Nerves Intact, Strength 5/5 all 4 extremities, Sensation intact all 4 extremities, Plantars down going.  4. Ears and Eyes appear Normal, Conjunctivae clear, PERRLA. Moist Oral Mucosa.  5. Supple Neck, No JVD, No cervical lymphadenopathy appriciated, No Carotid Bruits.  6. Symmetrical Chest wall movement, Good air movement bilaterally, CTAB.  7. RRR, No Gallops, Rubs or Murmurs, No Parasternal Heave.  8. Positive Bowel Sounds, Abdomen Soft, ++ epigastric tenderness, No organomegaly appriciated,No rebound -guarding or rigidity.  9.  No Cyanosis, Normal Skin Turgor, No Skin Rash or Bruise.  10. Good muscle tone,  joints appear normal , no effusions, Normal ROM.  11. No Palpable Lymph Nodes in Neck or Axillae  Data Review:    CBC No results for input(s): WBC, HGB, HCT, PLT, MCV, MCH, MCHC, RDW, LYMPHSABS, MONOABS, EOSABS, BASOSABS, BANDABS in the last 168 hours.  Invalid input(s): NEUTRABS, BANDSABD ------------------------------------------------------------------------------------------------------------------  Chemistries  No results for input(s): NA, K, CL, CO2, GLUCOSE, BUN, CREATININE, CALCIUM, MG, AST, ALT, ALKPHOS, BILITOT in the last 168 hours.  Invalid input(s): GFRCGP ------------------------------------------------------------------------------------------------------------------ CrCl cannot be calculated (Patient's most recent lab result is older than the maximum 21 days allowed.). ------------------------------------------------------------------------------------------------------------------ No results for input(s): TSH, T4TOTAL, T3FREE,  THYROIDAB in the last 72 hours.  Invalid input(s): FREET3  Coagulation profile No results for input(s): INR, PROTIME in the last 168 hours. ------------------------------------------------------------------------------------------------------------------- No results for input(s): DDIMER in the last 72 hours. -------------------------------------------------------------------------------------------------------------------  Cardiac Enzymes No results for input(s): CKMB, TROPONINI, MYOGLOBIN in the last 168 hours.  Invalid input(s): CK ------------------------------------------------------------------------------------------------------------------ No results found for: BNP   ---------------------------------------------------------------------------------------------------------------  Urinalysis    Component Value Date/Time   COLORURINE YELLOW 01/28/2014 1504   APPEARANCEUR CLEAR 01/28/2014 1504   LABSPEC >1.030 (H) 01/28/2014 1504   PHURINE 5.5 01/28/2014 1504   GLUCOSEU NEGATIVE 01/28/2014 1504   HGBUR NEGATIVE 01/28/2014 1504   BILIRUBINUR NEGATIVE 01/28/2014 1504   KETONESUR NEGATIVE 01/28/2014 1504   PROTEINUR NEGATIVE 01/28/2014 1504   UROBILINOGEN 0.2 01/28/2014 1504   NITRITE NEGATIVE 01/28/2014 1504   LEUKOCYTESUR NEGATIVE 01/28/2014 1504    ----------------------------------------------------------------------------------------------------------------   Imaging Results:    No results found.  Baseline EKG chest x-ray ordered   Assessment & Plan:     1. Acute pancreatitis history of gallstone pancreatitis in the past, status post cholecystectomy, [facility] which includes CT scan abdomen and ultrasound abdomen nonacute, he does have healthy intake of alcohol every day and is not clear about his total intake. Check lipid panel in the morning fasting, continue bowel rest, pain control, IV fluid, when necessary bolus as needed. [place]  [facility] has been called. May require MRCP.  ? Cholangitis - afebrile but subjective sweats at home, as above, may require MRCP/ERCP, Unasyn for now as he has significant leukocytosis (21K) and chances of obstructive cholangitis clinically are high due to significantly elevated total bilirubin (12) and elevated Alk Phos >400.  Hemodynamically stable, will be admitted to telemetry, does not require stepdown.   2. History of smoking and alcohol use. The code on patch, unclear about his total alcohol intake as he does not provide a straight history, counseled to quit both, for now CIWA protocol.  3. 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 For now cautious use of narcotics due to #1 above, patient counseled not to overuse or abuse medications.  4. History of factor V Leiden deficiency with history of multiple blood clots. For now heparin drip and hold xaralto. Pharmacy consulted.  5. Essential hypertension. As needed IV hydralazine for now.   DVT Prophylaxis Heparin gtt  AM Labs Ordered, also please review Full Orders  Family Communication: Admission, patients condition and plan of care including tests being ordered have been discussed with the patient and family who indicate understanding and agree with the plan and Code Status.  Code Status Full  Likely DC to  Home 3-4 days  Condition GUARDED    Consults called: GI    Admission status: Inpt    Time spent in minutes : 35   Lala Lund M.D on 04/28/2017 at 11:01 AM  Between 7am to 7pm - Pager - [phone] (  page via [facility], text pages only, please mention full 10 digit call back number).  After 7pm go to [email or web address] - password [facility]  [facility] - Office  [phone]

## 2017-04-28 NOTE — Progress Notes (Addendum)
ANTICOAGULATION CONSULT NOTE - Initial Consult  Pharmacy Consult for heparin (holding rivaroxaban) Indication: hx of factor v  Allergies  Allergen Reactions  . Reglan [Metoclopramide] Other (See Comments)    Muscle twitches.    Patient Measurements: Height: 6' (182.9 cm) Weight: 204 lb 5.9 oz (92.7 kg) IBW/kg (Calculated) : 77.6 Heparin Dosing Weight: 93 kg  Vital Signs: Temp: 98.1 F (36.7 C) (06/29 0900) Temp Source: Oral (06/29 0900) BP: 129/81 (06/29 0900) Pulse Rate: 62 (06/29 0900)  Labs: No results for input(s): HGB, HCT, PLT, APTT, LABPROT, INR, HEPARINUNFRC, HEPRLOWMOCWT, CREATININE, CKTOTAL, CKMB, TROPONINI in the last 72 hours.  CrCl cannot be calculated (Patient's most recent lab result is older than the maximum 21 days allowed.).  Assessment: CC/HPI: 56 yo m presenting with abdominal pain  PMH: chronic abd pain, pancreatitis,BPH, factor v leiden/hx of dvt on xarelto, gerd, HTN, OSA   xarelto pta for hx of factor v - last dose 1700 6/28 To initiate hep gtt in hospital.   Goal of Therapy:  Heparin level 0.3-0.7 units/ml aPTT 66 - 102 seconds Monitor platelets by anticoagulation protocol: Yes   Plan:  Hep gtt at 1700 no bolus Initiate at 1450 units/hr Initial lvl 2300 Daily HL, CBC, aPTT until correlate with HL  Isaac BlissMichael Aysia Lowder, PharmD, BCPS, BCCCP Clinical Pharmacist Clinical phone for 04/28/2017 from 7a-3:30p: [phone] If after 3:30p, please call main pharmacy at: [phone] 04/28/2017 11:19 AM  ________________________________ Addendum:  Baseline levels do not show that patient was compliant with xarelto pta so will initiate hep now

## 2017-04-28 NOTE — Progress Notes (Signed)
ANTICOAGULATION CONSULT NOTE  Pharmacy Consult for heparin (holding rivaroxaban) Indication: hx of factor v  Allergies  Allergen Reactions  . Reglan [Metoclopramide] Other (See Comments)    Muscle twitches.    Patient Measurements: Height: 6' (182.9 cm) Weight: 205 lb 0.4 oz (93 kg) (scale A) IBW/kg (Calculated) : 77.6 Heparin Dosing Weight: 93 kg  Vital Signs: Temp: 98.3 F (36.8 C) (06/29 2023) Temp Source: Oral (06/29 2023) BP: 120/64 (06/29 2023) Pulse Rate: 61 (06/29 2023)  Labs:  Recent Labs  04/28/17 1320 04/28/17 2115  HGB 12.2*  --   HCT 36.6*  --   PLT 260  --   APTT 34 46*  LABPROT 15.4*  --   INR 1.22  --   HEPARINUNFRC <0.10* 0.15*  CREATININE 0.84  --     Estimated Creatinine Clearance: 106.5 mL/min (by C-G formula based on SCr of 0.84 mg/dL).  Assessment: CC/HPI: 56 yo m presenting with abdominal pain  PMH: chronic abd pain, pancreatitis,BPH, factor v leiden/hx of dvt on xarelto, gerd, HTN, OSA   xarelto pta for hx of factor v - last dose 1700 6/28 To initiate hep gtt in hospital.   Initial heparin level low at 0.15, aptt also low at 43, this appears to correlate further indicating noncompliance and no need for further aptt monitoring.   Goal of Therapy:  Heparin level 0.3-0.7 units/ml aPTT 66 - 102 seconds Monitor platelets by anticoagulation protocol: Yes   Plan:  Increase heparin to 1750 units/hr Daily HL, CBC, aPTT until correlate with HL  Sheppard CoilFrank Wilson PharmD., BCPS Clinical Pharmacist Pager [phone] 04/28/2017 10:27 PM

## 2017-04-28 NOTE — Consult Note (Signed)
[facility] Consult: 11:46 AM 04/28/2017  LOS: 0 days    Referring Provider: Dr Candiss Norse  Primary Care Physician:  Dr Jerrell Belfast (spelling)in [place] Primary Gastroenterologist:  Althia Forts patient who was transferred from [place].  Patient followed by Dr. Manus Rudd in [place] discharged from his practice in 08/2016.  [facility] GI and hepatobiliary/pancreatic surgery    Reason for Consultation:  Jaundice.  Acute on chronic abdominal pain   HPI: Alex Hoffman is a 56 y.o. male.  PMH factor V Leiden deficiency.  On Xarelto for hx blood clots. Multiple orthopedic surgeries.  OSA, not on CPAP.  Urolithiasis.  Prostatitis.    GI issues include recurrent acute pancreatitis.  Chronic abdominal pain with drug seeking behavior.  Has been a patient of multiple pain management practices.  Just dismissed from [facility] pain mgt in 01/2017 due to a violation of the cancellation/no show policy 12/1538 MRCP.  Acute pancreatitis without organized fluid collections. Distal pancreatic duct stones. 06/2011 endoscopic ultrasound revealed pancreatic duct stone, no evidence for chronic pancreatitis. 07/2011 ERCP at [facility] showing proximal pancreatic duct stone which was removed. Unable to place a stent into the pancreatic duct due to extreme tortuosity. 2013 laparoscopic cholecystectomy. 10/2012 colonoscopy for minor rectal bleeding. Friable rectal mucosa. Question recent anal fissure. 01/2014 EGD.  Patent esophagus. Retained liquid and solid material in the stomach which precluded empiric esophageal dilatation.  Biopsies of esophagus negative for eosinophilic esophagitis. 03/2014 EGD with esoph dilation.   Patulous GE junction. Empiric dilation of normal-appearing esophagus.  04/2014. Esophagram: Focal narrowing of GE junction with  obstruction of tablet. He never followed through on planned manometry study suggested by GI at [facility].  07/2014 GI consult at [facility] for secondary opinion regarding chronic abdominal pain. Patient has canceled several arranged upper endoscopies with esophageal dilatation.  In pt GI consult 07/2016 at [facility] of 10/2017and acute on chronic abd pain, Lipase 176, AST/ALT 271/132, t bili 3.3.  08/24/16 CT abdomen pelvis  No acute findings, mild colonic diverticulosis without diverticulitis. Normal appendix. No pancreatitis or ductal derangements.  Plan was to follow-up EGD in a month as well consideration for repeat endoscopic ultrasound for elevated LFTs. He again cancelled GI follow up set for early 08/2016.  admits 1-2 wine coolers every few days.  Takes 6 or 7 oxycodone 30 mg daily for leg and MS pain.  Abdominal pain has been quiescent in recent months.  Presented to [facility] ED 1 week progressive, sharp, epigastric pain radiates into back.  Acutely worse yesterday AM with non-blody nausea and vomiting ocurring.  PO aggravates sxs.  Oxy did not help pain.  Urine amber in color, he noticed jaundice.  Went to ED in [place] CT there: fatty liver.  Ultrasound: steatosis vs hepatocellular disease. 1.2 mm CBD ? Post chole effect.  No CBD stone.  .   Lipase 3100.  T billi 12.7. AST/ALT 208/167.  ALK phos 433.  WBCs 22 K.  Afebrile. Dosed with Zosyn.     Past Medical History:  Diagnosis Date  . Abdominal pain   .  Abnormal EKG    hx left bundle branch block on ekg  . Anemia   . Arthritis    lower back. Dr. Effie Berkshire in [place]  . BPH (benign prostatic hyperplasia) 01/28/2014  . Bruises easily   . C. difficile colitis 01/2011?  Marland Kitchen Cystic thyroid nodule    left  . Factor V Leiden ([place]) 07/19/2013  . GERD (gastroesophageal reflux disease)   . Headache(784.0)   . Hearing loss   . Heterozygous factor V Leiden mutation ([place]) 12/2010  . HTN (hypertension) 01/28/2014  . Leg swelling   . Narcotic pain  contract exists with [place] 11/17/2014   Signed on 11/17/2014  . Numbness and tingling    both hands, and both legs and feet  . Palpitations   . Pancreatitis 05/2011, 12/2011   EUS Dr Ardis Hughs 06/09/11-> dilated main pancreatic duct in HOP, 2-3 filling defects distal pancreatic duct, peripancreatic fluid resolved  . Pneumonia as child  . Prostatitis, chronic   . Pulmonary embolism ([place]) 12/2010  . Rectal pain   . Sleep apnea    STOPBANG=5  . Weight loss     Past Surgical History:  Procedure Laterality Date  . BIOPSY N/A 02/13/2014   Procedure: BIOPSY;  Surgeon: Daneil Dolin, MD;  Location: [facility];  Service: Endoscopy;  Laterality: N/A;  . CARDIAC CATHETERIZATION  10/15/14  . CHOLECYSTECTOMY  01/25/2012   Procedure: LAPAROSCOPIC CHOLECYSTECTOMY WITH INTRAOPERATIVE CHOLANGIOGRAM;  Surgeon: Merrie Roof, MD;  Location: [facility];  Service: General;  Laterality: N/A;  . COLONOSCOPY  01/2011   Dr. Annitta Jersey at [facility]-->normal  . COLONOSCOPY WITH PROPOFOL  11/22/2012   [id number] anal rectum/fissure not identified although he certainly could have a chronic anal fissure   . CYSTOSCOPY WITH HYDRODISTENSION AND BIOPSY  01/25/2012   Procedure: CYSTOSCOPY/BIOPSY/HYDRODISTENSION;  Surgeon: Malka So, MD;  Location: [facility];  Service: Urology;  Laterality: N/A;  Cystoscopy and Hydrodistension of the Bladder  . ERCP  07/2011   Dr. Amalia Hailey [facility]-> pancreatic enterotomy, pancreatic duct stones removed, bile duct not manipulated, no stents  . ESOPHAGEAL DILATION N/A 04/10/2014   UJW:[id number] EG junction otherwise normal EGD  . ESOPHAGOGASTRODUODENOSCOPY  01/2011   Dr. Annitta Jersey at [facility]-->bile reflux in stomach-->patient c/o inadequate conscious sedation (Fentanyl '100mg'$ /Versed '8mg'$ )  . ESOPHAGOGASTRODUODENOSCOPY (EGD) WITH PROPOFOL N/A 02/13/2014   [id number] EGD as described above s/p bx (mild changes of reflux on esophageal bx  . ESOPHAGOGASTRODUODENOSCOPY (EGD) WITH PROPOFOL N/A  04/10/2014   Dr. Gala Romney: patulous EG junction, s/p 5 F Maloney dilation empirically  . FEMUR FRACTURE SURGERY  age 38   right leg  . KNEE SURGERY  1980's   arthroscopy, left knee  . NECK SURGERY  1999   C5/C6 fusion, limited neck mobility especially turning to left  . NERVE SURGERY  1980's   right thumb    Prior to Admission medications   Medication Sig Start Date End Date Taking? Authorizing Provider  ALPRAZolam (XANAX) 0.25 MG tablet Take 0.25 mg by mouth 2 (two) times daily as needed for anxiety.  03/29/17  Yes [provider]  amLODipine (NORVASC) 10 MG tablet Take 10 mg by mouth daily.   Yes [provider]  dexlansoprazole (DEXILANT) 60 MG capsule Take 60 mg by mouth daily.   Yes [provider]  furosemide (LASIX) 40 MG tablet Take 40 mg by mouth daily as needed for fluid.  01/19/15  Yes [provider]  oxycodone (ROXICODONE) 30 MG immediate  release tablet Take 30 mg by mouth every 4 (four) hours as needed for pain.   Yes [provider]  pantoprazole (PROTONIX) 40 MG tablet Take 1 tablet (40 mg total) by mouth daily. 08/23/16  Yes Annitta Needs, NP  rivaroxaban (XARELTO) 20 MG TABS tablet Take 20 mg by mouth daily.   Yes [provider]    Scheduled Meds: . amLODipine  10 mg Oral Daily  . folic acid  1 mg Oral Daily  . morphine  30 mg Oral Q12H  . multivitamin with minerals  1 tablet Oral Daily  . nicotine  21 mg Transdermal Daily  . pantoprazole  40 mg Oral Daily  . sodium chloride flush  3 mL Intravenous Q12H  . thiamine  100 mg Oral Daily   Or  . thiamine  100 mg Intravenous Daily   Infusions: . sodium chloride    . ampicillin-sulbactam (UNASYN) IV    . heparin    . sodium chloride     PRN Meds: bisacodyl, HYDROcodone-acetaminophen, LORazepam **OR** LORazepam, ondansetron **OR** ondansetron (ZOFRAN) IV, sodium chloride   Allergies as of 04/27/2017 - Review Complete 08/25/2016  Allergen Reaction Noted  .  Reglan [metoclopramide] Other (See Comments) 08/09/2013    Family History  Problem Relation Age of Onset  . Hypertension Mother   . Hypertension Father   . Cancer Maternal Aunt        colon/pancreas/lung/stomach  . GI problems Brother   . GI problems Unknown        stomach and lung cancer, aunts/uncles  . Heart attack Brother        before age of 76  . Stroke Brother   . Heart disease Maternal Uncle   . Cancer Maternal Grandfather        lung  . Colon cancer Neg Hx   . Liver disease Neg Hx     Social History   Social History  . Marital status: Married    Spouse name: N/A  . Number of children: 2  . Years of education: N/A   Occupational History  .      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 Smoking status: Current Some Day Smoker    Packs/day: 1.00    Years: 35.00    Types: Cigarettes    Last attempt to quit: 12/21/2012  . Smokeless tobacco: Never Used     Comment: About 2 cigarettes per day.   . Alcohol use No     Comment: No alcohol since January 27, 2011  . Drug use: No     Comment: hx of cocaine use; none since last summer  . Sexual activity: Not on file   Other Topics Concern  . Not on file   Social History Narrative  . No narrative on file    REVIEW OF SYSTEMS: Constitutional:  No weakness or fatigue ENT:  No nose bleeds Pulm:  No SOB.  Taking a deep breath makes the pain worse. CV:  No palpitations, no LE edema. No chest pain GU:  No hematuria, no frequency.  Dark urine. GI:  Per HPI Heme:  No unusual bleeding or bruising.   Transfusions:  None Neuro:  No headaches, no peripheral tingling or numbness.  Chronic back and lower extremity pain. Derm:  + Jaundice. No itching, no rash or sores.  Endocrine:  No sweats or chills.  No polyuria  or dysuria Immunization:  Did not inquire as to recent immunizations. Travel:  None beyond local counties in last few months.     PHYSICAL EXAM: Vital signs in last 24 hours: Vitals:   04/28/17 0900  BP: 129/81  Pulse: 62  Resp: 12  Temp: 98.1 F (36.7 C)   Wt Readings from Last 3 Encounters:  04/28/17 92.7 kg (204 lb 5.9 oz)  08/24/16 93 kg (205 lb)  08/23/16 93.7 kg (206 lb 9.6 oz)    General: Moderately chronically ill appearing WF in. He does not look acutely ill and is not in any acute distress though he complains of abdominal pain. Head:  No asymmetry or swelling.  Eyes:  Icteric sclera. Conjunctiva pink. Ears:  Not hard of hearing  Nose:  No discharge or congestion Mouth:  Tongue midline. Oral mucosa moist, pink, clear. Very few teeth remaining on his lower jaw. Neck:  No JVD, no masses, no thyromegaly. Lungs:  Clear bilaterally. No labored breathing. No cough. Heart: RRR. No MRG. S1, S2 present. Abdomen:  Active bowel sounds. Nondistended. Tender throughout to VERY MINOR pressure. Unable to appreciate masses, organomegaly or hernias..   Rectal: Deferred   Musc/Skeltl: No joint erythema or swelling or gross joint deformities. Extremities:  No CCE.  Neurologic:  Alert. Oriented times 3. Good historian. Moves all 4 limbs, strength not tested. No tremors. Skin:  No rashes, sores or visible AVMs. Nodes:  No cervical adenopathy.   Psych:  Cooperative. Affect normal. Cooperative.  Intake/Output from previous day: No intake/output data recorded. Intake/Output this shift: No intake/output data recorded.  LAB RESULTS: No results for input(s): WBC, HGB, HCT, PLT in the last 72 hours. BMET Lab Results  Component Value Date   NA 135 08/25/2016   NA 132 (L) 08/24/2016   NA 134 (L) 08/23/2016   K 3.9 08/25/2016   K 3.6 08/24/2016   K 4.2 08/23/2016   CL 102 08/25/2016   CL 97 (L) 08/24/2016   CL 97 (L) 08/23/2016   CO2 27 08/25/2016   CO2 27 08/24/2016   CO2 26 08/23/2016   GLUCOSE 93 08/25/2016   GLUCOSE 131 (H) 08/24/2016   GLUCOSE 97 08/23/2016   BUN 6 08/25/2016   BUN 7 08/24/2016    BUN 7 08/23/2016   CREATININE 0.82 08/25/2016   CREATININE 0.95 08/24/2016   CREATININE 0.72 08/23/2016   CALCIUM 8.4 (L) 08/25/2016   CALCIUM 9.0 08/24/2016   CALCIUM 9.2 08/23/2016   LFT No results for input(s): PROT, ALBUMIN, AST, ALT, ALKPHOS, BILITOT, BILIDIR, IBILI in the last 72 hours. PT/INR Lab Results  Component Value Date   INR 1.63 08/24/2016   INR 1.18 01/27/2012   INR 1.04 01/26/2012   Hepatitis Panel No results for input(s): HEPBSAG, HCVAB, HEPAIGM, HEPBIGM in the last 72 hours. C-Diff No components found for: CDIFF Lipase     Component Value Date/Time   LIPASE 55 (H) 08/25/2016 0621    Drugs of Abuse     Component Value Date/Time   LABOPIA NONE DETECTED 03/21/2016 0700   COCAINSCRNUR POSITIVE (A) 03/21/2016 0700   LABBENZ POSITIVE (A) 03/21/2016 0700   AMPHETMU NONE DETECTED 03/21/2016 0700   THCU NONE DETECTED 03/21/2016 0700   LABBARB NONE DETECTED 03/21/2016 0700     RADIOLOGY STUDIES: Dg Chest Port 1 View  Result Date: 04/28/2017 CLINICAL DATA:  Shortness of breath.  Abdominal pain EXAM: PORTABLE CHEST 1 VIEW COMPARISON:  08/24/2016 FINDINGS: Cardiomegaly with normal pulmonary vascularity. Mild bibasilar subsegmental atelectasis.  Mild right base infiltrate cannot be excluded. Small right pleural effusion. No pneumothorax. IMPRESSION: 1. Mild bibasilar atelectasis. Mild right base infiltrate cannot be excluded. Small right pleural effusion. 2. Cardiomegaly.  No pulmonary venous congestion . Electronically Signed   By: Marcello Moores  Register   On: 04/28/2017 11:28      IMPRESSION:   *  Acute pancreatitis, recurrent. Question biliary etiology versus alcohol etiology. CBD enlarged but not out of the norm expected post cholecystectomy, no evidence for common bile duct stones or recurrent pancreatic duct stones on CT or ultrasound performed yesterday at [facility]. Previous pancreatic duct stones and extraction.  *  Chronic Xarelto. Last dose taken  on 6/27. History of factor V Leiden deficiency and clotting disorder  *  Chronic musculoskeletal pain requiring significant daily doses of oxycodone    PLAN:     *  Supportive care. Question pursue MRCP?    *  Not clear he needs antibiotics but Unasyn left in place.  *  Clear liquids should be fine for this patient.  Needs IV fluids running will start lactated Ringer's at 150 miles per hour.   Azucena Freed  04/28/2017, 11:46 AM Pager: [phone]  GI ATTENDING  History, laboratories, x-rays, EXTENSIVE prior GI history reviewed. Patient personally seen and examined. Wife and sister in room. Patient is sent here from an outside facility with acute on chronic abdominal pain and jaundice. Imaging abnormalities and lab abnormalities as outlined. Terms of his acute chronic abdominal pain, this is likely secondary to alcohol-related pancreatitis. In terms of his jaundice with biliary ductal dilation, this is most likely due to obstruction of the distal bile duct in the region of the pancreatic head secondary to pancreatic disease. Cannot rule out common duct stone, though less likely. He may also have alcohol as a contributor to his intermittent chronic and current liver test abnormalities. No obvious cirrhosis by imaging and laboratories. Recommend bowel rest, IV fluids, pain control (this will be a difficult issue with his chronic narcotic and complaints of severe pain when I touched his superficial abdominal skin), and MRCP to evaluate the common bile duct. GI will follow. Discussed with patient and family. Thank you  Docia Chuck. Geri Seminole., M.D. [facility] of Gastroenterology

## 2017-04-28 NOTE — Progress Notes (Signed)
New pt admission from 4E. Pt brought to the floor in stable condition. Vitals taken. Initial Assessment done. All immediate pertinent needs to patient addressed. Patient Guide given to patient. Important safety instructions relating to hospitalization reviewed with patient. Patient verbalized understanding. Will continue to monitor pt.  Alex NeatJessica Nkenge Sonntag RN

## 2017-04-29 ENCOUNTER — Inpatient Hospital Stay (HOSPITAL_COMMUNITY): Payer: Medicare Other

## 2017-04-29 ENCOUNTER — Encounter (HOSPITAL_COMMUNITY): Payer: Self-pay

## 2017-04-29 DIAGNOSIS — R17 Unspecified jaundice: Secondary | ICD-10-CM

## 2017-04-29 DIAGNOSIS — R740 Nonspecific elevation of levels of transaminase and lactic acid dehydrogenase [LDH]: Secondary | ICD-10-CM

## 2017-04-29 DIAGNOSIS — K852 Alcohol induced acute pancreatitis without necrosis or infection: Secondary | ICD-10-CM

## 2017-04-29 DIAGNOSIS — K851 Biliary acute pancreatitis without necrosis or infection: Secondary | ICD-10-CM

## 2017-04-29 DIAGNOSIS — Z716 Tobacco abuse counseling: Secondary | ICD-10-CM

## 2017-04-29 LAB — COMPREHENSIVE METABOLIC PANEL
ALBUMIN: 2.5 g/dL — AB (ref 3.5–5.0)
ALK PHOS: 198 U/L — AB (ref 38–126)
ALT: 83 U/L — AB (ref 17–63)
ANION GAP: 8 (ref 5–15)
AST: 91 U/L — ABNORMAL HIGH (ref 15–41)
BUN: 7 mg/dL (ref 6–20)
CALCIUM: 8.4 mg/dL — AB (ref 8.9–10.3)
CO2: 23 mmol/L (ref 22–32)
Chloride: 104 mmol/L (ref 101–111)
Creatinine, Ser: 0.86 mg/dL (ref 0.61–1.24)
GFR calc Af Amer: >60 mL/min (ref >60)
GFR calc non Af Amer: >60 mL/min (ref >60)
GLUCOSE: 56 mg/dL — AB (ref 65–99)
Potassium: 3.6 mmol/L (ref 3.5–5.1)
SODIUM: 135 mmol/L (ref 135–145)
Total Bilirubin: 7.2 mg/dL — ABNORMAL HIGH (ref 0.3–1.2)
Total Protein: 5.7 g/dL — ABNORMAL LOW (ref 6.5–8.1)

## 2017-04-29 LAB — CBC
HCT: 35.7 % — ABNORMAL LOW (ref 39.0–52.0)
HEMOGLOBIN: 11.8 g/dL — AB (ref 13.0–17.0)
MCH: 30 pg (ref 26.0–34.0)
MCHC: 33.1 g/dL (ref 30.0–36.0)
MCV: 90.8 fL (ref 78.0–100.0)
Platelets: 274 10*3/uL (ref 150–400)
RBC: 3.93 MIL/uL — ABNORMAL LOW (ref 4.22–5.81)
RDW: 14.4 % (ref 11.5–15.5)
WBC: 10.3 10*3/uL (ref 4.0–10.5)

## 2017-04-29 LAB — HIV ANTIBODY (ROUTINE TESTING W REFLEX): HIV SCREEN 4TH GENERATION: NONREACTIVE

## 2017-04-29 LAB — LIPID PANEL
Cholesterol: 187 mg/dL (ref 0–200)
Triglycerides: 333 mg/dL — ABNORMAL HIGH (ref <150)
VLDL: 67 mg/dL — ABNORMAL HIGH (ref 0–40)

## 2017-04-29 LAB — HEPARIN LEVEL (UNFRACTIONATED)
HEPARIN UNFRACTIONATED: 0.19 [IU]/mL — AB (ref 0.30–0.70)
HEPARIN UNFRACTIONATED: 0.38 [IU]/mL (ref 0.30–0.70)
Heparin Unfractionated: 0.37 IU/mL (ref 0.30–0.70)

## 2017-04-29 LAB — LIPASE, BLOOD: LIPASE: 82 U/L — AB (ref 11–51)

## 2017-04-29 MED ORDER — GADOBENATE DIMEGLUMINE 529 MG/ML IV SOLN
20.0000 mL | Freq: Once | INTRAVENOUS | Status: AC
  Administered 2017-04-29: 20 mL via INTRAVENOUS

## 2017-04-29 NOTE — Progress Notes (Signed)
PROGRESS NOTE                                                                                                                                                                                                             Patient Demographics:    Alex Hoffman, is a 56 y.o. male, DOB - [birth year], ZOX:[id number]  Admit date - 04/28/2017   Admitting Physician Bobette Mo, MD  Outpatient Primary MD for the patient is Patient, No Pcp Per  LOS - 1  Outpatient Specialists:None  No chief complaint on file.      Brief Narrative  56 year old male with recurrent pancreatitis status post cholecystectomy, chronic pain syndrome and narcotic seeking behavior, factor V Leyden deficiency with history of multiple clots, on Xarelto, hypertension, GERD, ongoing tobacco use and alcohol use (drinks 1-2 wine cooler Every day) was admitted to [facility] with one week history of progressive epigastric pain radiating to the back associated with nausea and vomiting suggestive of acute pancreatitis. In the [facility] ED labs showed transaminitis with AST and ALT in 200s, lipase of 3100, total bilirubin of 12 and white count of 21K. workup including CT and ultrasound of the abdomen was nonacute with fatty changes of the liver. No CBD stone. GI consulted and patient transferred to [facility].  Subjective:   Still complaining of pain in his abdomen. No nausea or vomiting. Frequently asking for pain medication.   Assessment  & Plan :   Principal problem Acute recurrent pancreatitis Suspected to be gallstone pancreatitis in the past and underwent cholecystectomy. No acute findings on CT and ultrasound abdomen at outside hospital. Suspect this is alcoholic pancreatitis. Continue bowel rest (keep nothing by mouth), IV fluids, pain control with MS Contin and when necessary Vicodin. (Will not escalate for now). Serial abdominal exam. MRCP pending.  Lipid panel shows elevated triglycerides (300s) [facility] following. Patient was apparently fired from [facility] GI  Acute transaminitis with severe jaundice Suspect distal CBD obstruction from acute pancreatitis. Alcoholic hepatitis is another possibility. LFTs slowly trending down. Check MRCP to evaluate CBD. On empiric antibiotic for possible cholangitis.  Factor V lesion deficiency with blood clots, on chronic anticoagulation Xarelto on hold and placed on IV heparin.  Chronic pain with narcotic seeking behavior Continue MS Contin and when necessary Vicodin. No need to escalate for now. Apparently was  fired from GI in [place] for breaking pain contract.  Essential hypertension When necessary hydralazine.  Ongoing smoking and alcohol use Counseled strongly on cessation. Nicotine patch.     Code Status : Full code  Family Communication  : None at bedside  Disposition Plan  : Home once improved  Barriers For Discharge :   Consults  :  [facility]  Procedures  : MRCP (pending)  DVT Prophylaxis  : IV heparin  Lab Results  Component Value Date   PLT 274 04/29/2017    Antibiotics  :    Anti-infectives    Start     Dose/Rate Route Frequency Ordered Stop   04/28/17 2000  Ampicillin-Sulbactam (UNASYN) 3 g in sodium chloride 0.9 % 100 mL IVPB     3 g 200 mL/hr over 30 Minutes Intravenous Every 8 hours 04/28/17 1426     04/28/17 1130  Ampicillin-Sulbactam (UNASYN) 3 g in sodium chloride 0.9 % 100 mL IVPB     3 g 200 mL/hr over 30 Minutes Intravenous  Once 04/28/17 1123 04/28/17 1332        Objective:   Vitals:   04/28/17 1556 04/28/17 2023 04/29/17 0049 04/29/17 0437  BP: (!) 149/69 120/64 117/73 (!) 125/59  Pulse: 61 61 (!) 58 61  Resp: 20 20 20 20   Temp: 98.1 F (36.7 C) 98.3 F (36.8 C) 99 F (37.2 C) 97.7 F (36.5 C)  TempSrc: Oral Oral Oral Oral  SpO2: 98% 98% 98% 97%  Weight: 93 kg (205 lb 0.4 oz)   94 kg (207 lb 3.2 oz)  Height: 6' (1.829 m)       Wt  Readings from Last 3 Encounters:  04/29/17 94 kg (207 lb 3.2 oz)  08/24/16 93 kg (205 lb)  08/23/16 93.7 kg (206 lb 9.6 oz)     Intake/Output Summary (Last 24 hours) at 04/29/17 1003 Last data filed at 04/29/17 0942  Gross per 24 hour  Intake          2276.23 ml  Output             1825 ml  Net           451.23 ml     Physical Exam  Gen: not in distress HEENT: nDry mucosa,t mucosa, supple neck Chest: clear b/l, no added sounds CVS: N S1&S2, no murmurs, rubs or gallop GI: soft,nondistended, which is on palpation superficially, bowel sounds present  Musculoskeletal: warm, no edema OZH:[id number]:Oriented, no tremors   Data Review:    CBC  Recent Labs Lab 04/28/17 1320 04/29/17 0255  WBC 12.1* 10.3  HGB 12.2* 11.8*  HCT 36.6* 35.7*  PLT 260 274  MCV 91.0 90.8  MCH 30.3 30.0  MCHC 33.3 33.1  RDW 14.3 14.4    Chemistries   Recent Labs Lab 04/28/17 1320 04/29/17 0255  NA 135 135  K 3.4* 3.6  CL 105 104  CO2 24 23  GLUCOSE 87 56*  BUN 12 7  CREATININE 0.84 0.86  CALCIUM 8.5* 8.4*  AST 99* 91*  ALT 93* 83*  ALKPHOS 214* 198*  BILITOT 8.5* 7.2*   ------------------------------------------------------------------------------------------------------------------  Recent Labs  04/29/17 0255  CHOL 187  HDL <10*  LDLCALC NOT CALCULATED  TRIG 333*  CHOLHDL NOT CALCULATED    No results found for: HGBA1C ------------------------------------------------------------------------------------------------------------------ No results for input(s): TSH, T4TOTAL, T3FREE, THYROIDAB in the last 72 hours.  Invalid input(s): FREET3 ------------------------------------------------------------------------------------------------------------------ No results for input(s): VITAMINB12, FOLATE, FERRITIN, TIBC, IRON, RETICCTPCT in  the last 72 hours.  Coagulation profile  Recent Labs Lab 04/28/17 1320  INR 1.22    No results for input(s): DDIMER in the last 72  hours.  Cardiac Enzymes No results for input(s): CKMB, TROPONINI, MYOGLOBIN in the last 168 hours.  Invalid input(s): CK ------------------------------------------------------------------------------------------------------------------ No results found for: BNP  Inpatient Medications  Scheduled Meds: . amLODipine  10 mg Oral Daily  . folic acid  1 mg Oral Daily  . morphine  30 mg Oral Q12H  . multivitamin with minerals  1 tablet Oral Daily  . nicotine  21 mg Transdermal Daily  . pantoprazole  40 mg Oral Daily  . sodium chloride flush  3 mL Intravenous Q12H  . thiamine  100 mg Oral Daily   Or  . thiamine  100 mg Intravenous Daily   Continuous Infusions: . ampicillin-sulbactam (UNASYN) IV Stopped (04/29/17 0452)  . heparin 2,000 Units/hr (04/29/17 0703)  . lactated ringers 150 mL/hr at 04/29/17 0703  . sodium chloride     PRN Meds:.bisacodyl, HYDROcodone-acetaminophen, LORazepam **OR** LORazepam, ondansetron **OR** ondansetron (ZOFRAN) IV, sodium chloride  Micro Results Recent Results (from the past 240 hour(s))  MRSA PCR Screening     Status: None   Collection Time: 04/28/17  9:15 AM  Result Value Ref Range Status   MRSA by PCR NEGATIVE NEGATIVE Final    Comment:        The GeneXpert MRSA Assay (FDA approved for NASAL specimens only), is one component of a comprehensive MRSA colonization surveillance program. It is not intended to diagnose MRSA infection nor to guide or monitor treatment for MRSA infections.     Radiology Reports Dg Chest Port 1 View  Result Date: 04/28/2017 CLINICAL DATA:  Shortness of breath.  Abdominal pain EXAM: PORTABLE CHEST 1 VIEW COMPARISON:  08/24/2016 FINDINGS: Cardiomegaly with normal pulmonary vascularity. Mild bibasilar subsegmental atelectasis. Mild right base infiltrate cannot be excluded. Small right pleural effusion. No pneumothorax. IMPRESSION: 1. Mild bibasilar atelectasis. Mild right base infiltrate cannot be excluded. Small  right pleural effusion. 2. Cardiomegaly.  No pulmonary venous congestion . Electronically Signed   By: Maisie Fus  Register   On: 04/28/2017 11:28    Time Spent in minutes  25   Eddie North M.D on 04/29/2017 at 10:03 AM  Between 7am to 7pm - Pager - [phone]  After 7pm go to [email or web address] - password [facility]  [facility] -  Office  [phone]

## 2017-04-29 NOTE — Progress Notes (Signed)
ANTICOAGULATION CONSULT NOTE - Follow Up Consult  Pharmacy Consult for heparin Indication: h/o DVT and factor V leiden  Labs:  Recent Labs  04/28/17 1320 04/28/17 2115 04/29/17 0255  HGB 12.2*  --  11.8*  HCT 36.6*  --  35.7*  PLT 260  --  274  APTT 34 46*  --   LABPROT 15.4*  --   --   INR 1.22  --   --   HEPARINUNFRC <0.10* 0.15* 0.19*  CREATININE 0.84  --   --     Assessment: 55yo male remains subtherapeutic on heparin after rate increase.  Goal of Therapy:  Heparin level 0.3-0.7 units/ml   Plan:  Will increase heparin gtt by 2-3 units/kg/hr to 2000 units/hr and check level in 6hr.  Vernard GamblesVeronda Torien Ramroop, PharmD, BCPS  04/29/2017,4:10 AM

## 2017-04-29 NOTE — Progress Notes (Signed)
Dressing applied to right great toe/foot. Pt reports outpatient treatment for this wound, but no treatment since [facility].  Area cleansed with sterile saline, dressing applied including first layer of xeroform, wrapped with gauze including one wrap around ankle for support.

## 2017-04-29 NOTE — Progress Notes (Signed)
     [facility] Progress Note  Chief Complaint:    Abdominal pain and jaundice  Subjective: awoke him from sleep. Complaning of generalized abdominal pain going through to his back  Objective:  Vital signs in last 24 hours: Temp:  [97.7 F (36.5 C)-99 F (37.2 C)] 97.7 F (36.5 C) (06/30 0437) Pulse Rate:  [58-61] 61 (06/30 0437) Resp:  [20] 20 (06/30 0437) BP: (117-149)/(59-73) 125/59 (06/30 0437) SpO2:  [97 %-98 %] 97 % (06/30 0437) Weight:  [205 lb 0.4 oz (93 kg)-207 lb 3.2 oz (94 kg)] 207 lb 3.2 oz (94 kg) (06/30 0437) Last BM Date: 04/27/17 General:   Alert, well-developed, white male in NAD EENT:  Normal hearing, non icteric sclera, conjunctive pink.  Heart:  Regular rate and rhythm Pulm: Normal respiratory effort Abdomen:  Soft, nondistended, generalized tenderness to even superficial palpation. nontender.  Normal bowel sounds, Neurologic:  Alert and  oriented x4;  grossly normal neurologically. Psych:  Pleasant, cooperative.  Normal mood and affect.   Intake/Output from previous day: 06/29 0701 - 06/30 0700 In: 2103.4 [I.V.:1903.4; IV Piggyback:200] Out: 1525 [Urine:1525] Intake/Output this shift: Total I/O In: 172.8 [I.V.:172.8] Out: 300 [Urine:300]  Lab Results:  Recent Labs  04/28/17 1320 04/29/17 0255  WBC 12.1* 10.3  HGB 12.2* 11.8*  HCT 36.6* 35.7*  PLT 260 274   BMET  Recent Labs  04/28/17 1320 04/29/17 0255  NA 135 135  K 3.4* 3.6  CL 105 104  CO2 24 23  GLUCOSE 87 56*  BUN 12 7  CREATININE 0.84 0.86  CALCIUM 8.5* 8.4*   LFT  Recent Labs  04/29/17 0255  PROT 5.7*  ALBUMIN 2.5*  AST 91*  ALT 83*  ALKPHOS 198*  BILITOT 7.2*   PT/INR  Recent Labs  04/28/17 1320  LABPROT 15.4*  INR 1.22     Dg Chest Port 1 View  Result Date: 04/28/2017 CLINICAL DATA:  Shortness of breath.  Abdominal pain EXAM: PORTABLE CHEST 1 VIEW COMPARISON:  08/24/2016 FINDINGS: Cardiomegaly with normal pulmonary vascularity. Mild  bibasilar subsegmental atelectasis. Mild right base infiltrate cannot be excluded. Small right pleural effusion. No pneumothorax. IMPRESSION: 1. Mild bibasilar atelectasis. Mild right base infiltrate cannot be excluded. Small right pleural effusion. 2. Cardiomegaly.  No pulmonary venous congestion . Electronically Signed   By: Marcello Moores  Register   On: 04/28/2017 11:28    ASSESSMENT / PLAN:   73. 56 yo male transferred from [facility] with recurrent acute pancreatitis. Lipase was 2100, transaminases in 200s and bilirubin 12. No evidence for CBD stone or recurrent PD stones on CT scan. He is post cholecystectomy. Bili is declining, down in 7 range now. His alk phos is chronically elevated. I woke him up from sleep, immediately complaining about abdominal pain and wants to eat.  -MRCP has been ordered to evaluate for CBD stone -am labs -NPO, at least until after MRCP -continue IVF, supportive care for now.   2. 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 Currently getting heparin  3. Chronic pain, on chronic narcotics.    LOS: 1 day   Tye Savoy ,NP 04/29/2017, 11:29 AM  Pager number [phone]    [facility] Attending   I have taken an interval history, reviewed the chart and examined the patient. I agree with the Advanced Practitioner's note, impression and recommendations.    Waiting for MRCP   Gatha Mayer, MD, [facility] [phone] (pager) 04/29/2017 1:59 PM

## 2017-04-29 NOTE — Progress Notes (Signed)
ANTICOAGULATION CONSULT NOTE  Pharmacy Consult for heparin (holding rivaroxaban) Indication: hx of factor v  Allergies  Allergen Reactions  . Reglan [Metoclopramide] Other (See Comments)    Muscle twitches.    Patient Measurements: Height: 6' (182.9 cm) Weight: 207 lb 3.2 oz (94 kg) IBW/kg (Calculated) : 77.6 Heparin Dosing Weight: 93 kg  Vital Signs: Temp: 97.8 F (36.6 C) (06/30 1214) Temp Source: Oral (06/30 1214) BP: 144/69 (06/30 1214) Pulse Rate: 49 (06/30 1214)  Labs:  Recent Labs  04/28/17 1320 04/28/17 2115 04/29/17 0255 04/29/17 1000 04/29/17 1559  HGB 12.2*  --  11.8*  --   --   HCT 36.6*  --  35.7*  --   --   PLT 260  --  274  --   --   APTT 34 46*  --   --   --   LABPROT 15.4*  --   --   --   --   INR 1.22  --   --   --   --   HEPARINUNFRC <0.10* 0.15* 0.19* 0.38 0.37  CREATININE 0.84  --  0.86  --   --     Estimated Creatinine Clearance: 112.9 mL/min (by C-G formula based on SCr of 0.86 mg/dL).  Assessment: CC/HPI: 56 yo m presenting with abdominal pain on xarelto pta for hx of factor v - last dose 1700 6/28. Pharmacy consulted to initiate hep gtt in hospital.   Heparin level remains therapeutic at 0.37 on 2000 units/hr. CBC stable with no reported s/s bleeding.  Goal of Therapy:  Heparin level 0.3-0.7 units/ml Monitor platelets by anticoagulation protocol: Yes   Plan:  Continue heparin gtt at 2000 units/hr Daily HL, CBC F/u restart Xarelto  Lysle Pearlachel Rola Lennon, PharmD, BCPS 04/29/2017 4:50 PM

## 2017-04-29 NOTE — Progress Notes (Signed)
ANTICOAGULATION CONSULT NOTE  Pharmacy Consult for heparin (holding rivaroxaban) Indication: hx of factor v  Allergies  Allergen Reactions  . Reglan [Metoclopramide] Other (See Comments)    Muscle twitches.    Patient Measurements: Height: 6' (182.9 cm) Weight: 207 lb 3.2 oz (94 kg) IBW/kg (Calculated) : 77.6 Heparin Dosing Weight: 93 kg  Vital Signs: Temp: 97.7 F (36.5 C) (06/30 0437) Temp Source: Oral (06/30 0437) BP: 125/59 (06/30 0437) Pulse Rate: 61 (06/30 0437)  Labs:  Recent Labs  04/28/17 1320 04/28/17 2115 04/29/17 0255 04/29/17 1000  HGB 12.2*  --  11.8*  --   HCT 36.6*  --  35.7*  --   PLT 260  --  274  --   APTT 34 46*  --   --   LABPROT 15.4*  --   --   --   INR 1.22  --   --   --   HEPARINUNFRC <0.10* 0.15* 0.19* 0.38  CREATININE 0.84  --  0.86  --     Estimated Creatinine Clearance: 112.9 mL/min (by C-G formula based on SCr of 0.86 mg/dL).  Assessment: CC/HPI: 56 yo m presenting with abdominal pain on xarelto pta for hx of factor v - last dose 1700 6/28. Pharmacy consulted to initiate hep gtt in hospital.   HL now therapeutic after rate increase to 2000 units/hr. CBC stable with no reported s/s bleeding.  Goal of Therapy:  Heparin level 0.3-0.7 units/ml Monitor platelets by anticoagulation protocol: Yes   Plan:  Continue heparin gtt at 2000 units/hr 6 hour HL to confirm Daily HL, CBC F/u restart Xarelto  Adryen Cookson K. Bonnye FavaNicolsen, PharmD, BCPS, CPP Clinical Pharmacist Pager: [phone] Phone: [phone] 04/29/2017 11:44 AM

## 2017-04-30 LAB — HEPARIN LEVEL (UNFRACTIONATED): HEPARIN UNFRACTIONATED: 0.3 [IU]/mL (ref 0.30–0.70)

## 2017-04-30 LAB — COMPREHENSIVE METABOLIC PANEL
ALBUMIN: 2.3 g/dL — AB (ref 3.5–5.0)
ALK PHOS: 190 U/L — AB (ref 38–126)
ALT: 76 U/L — ABNORMAL HIGH (ref 17–63)
ANION GAP: 11 (ref 5–15)
AST: 88 U/L — AB (ref 15–41)
BILIRUBIN TOTAL: 5.8 mg/dL — AB (ref 0.3–1.2)
BUN: 7 mg/dL (ref 6–20)
CO2: 24 mmol/L (ref 22–32)
Calcium: 8.5 mg/dL — ABNORMAL LOW (ref 8.9–10.3)
Chloride: 101 mmol/L (ref 101–111)
Creatinine, Ser: 0.75 mg/dL (ref 0.61–1.24)
GFR calc Af Amer: >60 mL/min (ref >60)
GFR calc non Af Amer: >60 mL/min (ref >60)
GLUCOSE: 47 mg/dL — AB (ref 65–99)
POTASSIUM: 3.4 mmol/L — AB (ref 3.5–5.1)
SODIUM: 136 mmol/L (ref 135–145)
Total Protein: 6 g/dL — ABNORMAL LOW (ref 6.5–8.1)

## 2017-04-30 LAB — CBC
HEMATOCRIT: 36 % — AB (ref 39.0–52.0)
Hemoglobin: 11.9 g/dL — ABNORMAL LOW (ref 13.0–17.0)
MCH: 29.9 pg (ref 26.0–34.0)
MCHC: 33.1 g/dL (ref 30.0–36.0)
MCV: 90.5 fL (ref 78.0–100.0)
Platelets: 312 10*3/uL (ref 150–400)
RBC: 3.98 MIL/uL — ABNORMAL LOW (ref 4.22–5.81)
RDW: 14.2 % (ref 11.5–15.5)
WBC: 7.2 10*3/uL (ref 4.0–10.5)

## 2017-04-30 LAB — MAGNESIUM: Magnesium: 1.8 mg/dL (ref 1.7–2.4)

## 2017-04-30 MED ORDER — HEPARIN (PORCINE) IN NACL 100-0.45 UNIT/ML-% IJ SOLN
2100.0000 [IU]/h | INTRAMUSCULAR | Status: DC
  Administered 2017-04-30 – 2017-05-01 (×3): 2100 [IU]/h via INTRAVENOUS
  Filled 2017-04-30 (×2): qty 250

## 2017-04-30 MED ORDER — HYDROCERIN EX CREA
TOPICAL_CREAM | Freq: Two times a day (BID) | CUTANEOUS | Status: DC
  Administered 2017-05-01 – 2017-05-02 (×4): via TOPICAL
  Administered 2017-05-02: 1 via TOPICAL
  Administered 2017-05-03: 10:00:00 via TOPICAL
  Filled 2017-04-30: qty 113

## 2017-04-30 MED ORDER — ZOLPIDEM TARTRATE 5 MG PO TABS
10.0000 mg | ORAL_TABLET | Freq: Every evening | ORAL | Status: AC | PRN
  Administered 2017-04-30: 10 mg via ORAL
  Filled 2017-04-30: qty 2

## 2017-04-30 MED ORDER — ZOLPIDEM TARTRATE 5 MG PO TABS
10.0000 mg | ORAL_TABLET | Freq: Every evening | ORAL | Status: DC | PRN
  Administered 2017-04-30 – 2017-05-02 (×3): 10 mg via ORAL
  Filled 2017-04-30 (×3): qty 2

## 2017-04-30 MED ORDER — POTASSIUM CHLORIDE 10 MEQ/100ML IV SOLN
10.0000 meq | INTRAVENOUS | Status: AC
  Administered 2017-04-30 (×3): 10 meq via INTRAVENOUS
  Filled 2017-04-30 (×3): qty 100

## 2017-04-30 NOTE — Progress Notes (Signed)
PROGRESS NOTE                                                                                                                                                                                                             Patient Demographics:    Alex Hoffman, is a 56 y.o. male, DOB - 12-06-1960, ZOX:[id number]  Admit date - 04/28/2017   Admitting Physician Bobette Mo, MD  Outpatient Primary MD for the patient is Patient, No Pcp Per  LOS - 2  Outpatient Specialists:None  No chief complaint on file.      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 In the [facility] ED labs showed transaminitis with AST and ALT in 200s, lipase of 3100, total bilirubin of 12 and white count of 21K. workup including CT and ultrasound of the abdomen was nonacute with fatty changes of the liver. No CBD stone. GI consulted and patient transferred to [facility].  Subjective:   Still having abdominal pain and asking for pain medications frequently. Was able to tolerate some ice chips yesterday. Agrees on trying some clear liquid today.  Assessment  & Plan :   Principal problem Acute recurrent pancreatitis Suspect alcoholic pancreatitis. Has had cholecystectomy for prior gallstone pancreatitis. -Slowly improving with supportive care (IV fluids, pain control and bowel rest). Lipase normal. -Start on clear liquid. - pain control with MS Contin and when necessary Vicodin. (No need to escalate pain medications) -MRCP  shows dilated CBD without evidence of pancreatitis or obstruction. No signs of cholangitis, we'll discontinue antibiotic. [facility] consult appreciated. Patient was apparently fired from [facility] [employer] [facility] for failing pain contract.   Acute transaminitis with severe jaundice Suspect distal CBD obstruction from acute pancreatitis. Alcoholic hepatitis is another possibility. LFTs now trending down. MRCP without CBD obstruction or active pancreatitis or necrosis.  Factor V lesion deficiency with blood clots, on chronic anticoagulation On IV heparin. Resume Xarelto if tolerating diet and medication.  Chronic pain with narcotic seeking behavior Continue  MS Contin and when necessary Vicodin. (Do not escalate pain medication)   Essential hypertension When necessary hydralazine.  Ongoing smoking and alcohol use Counseled strongly on cessation. Nicotine patch. No signs of alcohol withdrawal.    Code Status : Full code  Family Communication  : None at bedside  Disposition Plan  : Home once improved  Barriers For Discharge : Active symptoms  Consults  :  [facility]  Procedures  : MRCP    DVT Prophylaxis  : IV heparin  Lab Results  Component Value Date   PLT 312 04/30/2017    Antibiotics  :    Anti-infectives    Start     Dose/Rate Route Frequency Ordered Stop   04/28/17 2000  Ampicillin-Sulbactam (UNASYN) 3 g in sodium chloride 0.9 % 100 mL IVPB     3 g 200 mL/hr over 30 Minutes Intravenous Every 8 hours 04/28/17 1426     04/28/17 1130  Ampicillin-Sulbactam (UNASYN) 3 g in sodium chloride 0.9 % 100 mL IVPB     3 g 200 mL/hr over 30 Minutes Intravenous  Once 04/28/17 1123 04/28/17 1332        Objective:   Vitals:   04/29/17 1214 04/29/17 2045 04/30/17 0000 04/30/17 0600  BP: (!) 144/69 130/67 133/67 125/62  Pulse: (!) 49 62 (!) 56 60  Resp: 20 16  16   Temp: 97.8 F (36.6 C) 97.7 F (36.5 C)  97.8 F (36.6 C)  TempSrc: Oral Oral  Oral  SpO2: 97% 99%  97%  Weight:    92.3  kg (203 lb 6.4 oz)  Height:        Wt Readings from Last 3 Encounters:  04/30/17 92.3 kg (203 lb 6.4 oz)  08/24/16 93 kg (205 lb)  08/23/16 93.7 kg (206 lb 9.6 oz)     Intake/Output Summary (Last 24 hours) at 04/30/17 1145 Last data filed at 04/30/17 1122  Gross per 24 hour  Intake          5475.94 ml  Output             4850 ml  Net           625.94 ml     Physical Exam Gen.: Middle aged male not in distress HEENT: Moist mucosa, supple neck Chest: Clear bilaterally CVS: Normal S1 and S2, no murmurs GI: Soft, nondistended, tender, bowel sounds present Musculoskeletal: Warm, no edema    Data Review:    CBC  Recent Labs Lab 04/28/17 1320 04/29/17 0255 04/30/17 0247  WBC 12.1* 10.3 7.2  HGB 12.2* 11.8* 11.9*  HCT 36.6* 35.7* 36.0*  PLT 260 274 312  MCV 91.0 90.8 90.5  MCH 30.3 30.0 29.9  MCHC 33.3 33.1 33.1  RDW 14.3 14.4 14.2    Chemistries   Recent Labs Lab 04/28/17 1320 04/29/17 0255 04/30/17 0247 04/30/17 0730  NA 135 135 136  --   K 3.4* 3.6 3.4*  --   CL 105 104 101  --   CO2 24 23 24   --   GLUCOSE 87 56* 47*  --   BUN 12 7 7   --   CREATININE 0.84 0.86 0.75  --   CALCIUM 8.5* 8.4* 8.5*  --   MG  --   --   --  1.8  AST 99* 91* 88*  --   ALT 93* 83* 76*  --   ALKPHOS 214* 198* 190*  --   BILITOT 8.5* 7.2* 5.8*  --    ------------------------------------------------------------------------------------------------------------------  Recent Labs  04/29/17 0255  CHOL 187  HDL <10*  LDLCALC NOT CALCULATED  TRIG 333*  CHOLHDL NOT CALCULATED    No results found for: HGBA1C ------------------------------------------------------------------------------------------------------------------ No results for input(s): TSH, T4TOTAL, T3FREE, THYROIDAB in the last 72 hours.  Invalid input(s): FREET3 ------------------------------------------------------------------------------------------------------------------ No results for input(s):  VITAMINB12, FOLATE, FERRITIN, TIBC, IRON, RETICCTPCT in the last 72 hours.  Coagulation profile  Recent Labs Lab 04/28/17 1320  INR 1.22    No results for input(s): DDIMER in the last 72 hours.  Cardiac Enzymes No results for input(s): CKMB, TROPONINI, MYOGLOBIN in the last 168 hours.  Invalid input(s): CK ------------------------------------------------------------------------------------------------------------------ No results found for: BNP  Inpatient Medications  Scheduled Meds: . amLODipine  10 mg Oral Daily  . folic acid  1 mg Oral Daily  . morphine  30 mg Oral Q12H  . multivitamin with minerals  1 tablet Oral Daily  . nicotine  21 mg Transdermal Daily  . pantoprazole  40 mg Oral Daily  . sodium chloride flush  3 mL Intravenous Q12H  . thiamine  100 mg Oral Daily   Or  . thiamine  100 mg Intravenous Daily   Continuous Infusions: . ampicillin-sulbactam (UNASYN) IV Stopped (04/30/17 0842)  . heparin 2,100 Units/hr (04/30/17 0934)  . lactated ringers Stopped (04/30/17 0817)  . sodium chloride     PRN Meds:.bisacodyl, HYDROcodone-acetaminophen, LORazepam **OR** LORazepam, ondansetron **OR** ondansetron (ZOFRAN) IV, sodium chloride  Micro Results Recent Results (from the past 240 hour(s))  MRSA PCR Screening     Status: None   Collection Time: 04/28/17  9:15 AM  Result Value Ref Range Status   MRSA by PCR NEGATIVE NEGATIVE Final    Comment:        The GeneXpert MRSA Assay (FDA approved for NASAL specimens only), is one component of a comprehensive MRSA colonization surveillance program. It is not intended to diagnose MRSA infection nor to guide or monitor treatment for MRSA infections.   Culture, blood (routine x 2)     Status: None (Preliminary result)   Collection Time: 04/28/17  1:20 PM  Result Value Ref Range Status   Specimen Description BLOOD LEFT ARM  Final   Special Requests IN PEDIATRIC BOTTLE Blood Culture adequate volume  Final   Culture NO  GROWTH 1 DAY  Final   Report Status PENDING  Incomplete  Culture, blood (routine x 2)     Status: None (Preliminary result)   Collection Time: 04/28/17  1:20 PM  Result Value Ref Range Status   Specimen Description BLOOD RIGHT HAND  Final   Special Requests IN PEDIATRIC BOTTLE Blood Culture adequate volume  Final   Culture NO GROWTH 1 DAY  Final   Report Status PENDING  Incomplete    Radiology Reports Mr 3d Recon At Scanner  Result Date: 04/30/2017 CLINICAL DATA:  Abdominal pain.  Acute pancreatitis. EXAM: MRI ABDOMEN WITHOUT AND WITH CONTRAST (INCLUDING MRCP) TECHNIQUE: Multiplanar multisequence MR imaging of the abdomen was performed both before and after the administration of intravenous contrast. Heavily T2-weighted images of the biliary and pancreatic ducts were obtained, and three-dimensional MRCP images were rendered by post processing. CONTRAST:  20 mL MULTIHANCE GADOBENATE DIMEGLUMINE 529 MG/ML IV SOLN COMPARISON:  CT on 08/24/2016 FINDINGS: Lower chest: No acute findings. Hepatobiliary: Mild diffuse hepatic steatosis. No liver masses identified. Prior cholecystectomy noted. Mild diffuse biliary ductal dilatation seen with proximal common bile duct measuring 13 mm. No evidence of choledocholithiasis or biliary stricture. Pancreas: No evidence of pancreatic  ductal dilatation or pancreas divisum. Beaded appearance of the main pancreatic duct is seen as well as pancreatic atrophy, suspicious for chronic pancreatitis. No evidence of acute peripancreatic inflammatory changes or fluid collections. Spleen:  Within normal limits in size and appearance. Adrenals/Urinary Tract: No masses identified. No evidence of hydronephrosis. Stomach/Bowel: Visualized portions within the abdomen are unremarkable. Vascular/Lymphatic: Several small lymph nodes are seen in the porta hepatis and portacaval space, largest measuring 1.5 cm, likely reactive in etiology. No abdominal aortic aneurysm. Retroaortic left renal  vein incidentally noted. Other:  None. Musculoskeletal:  No suspicious bone lesions identified. IMPRESSION: Prior cholecystectomy. Mild diffuse biliary ductal dilatation, with common bile duct measuring 13 mm. No radiographic evidence of choledocholithiasis or other obstructing etiology. No radiographic evidence of acute pancreatitis or pancreatic mass. Beaded appearance of main pancreatic duct and diffuse pancreatic atrophy are suggestive of chronic pancreatitis. Mild hepatic steatosis. Electronically Signed   By: Myles RosenthalJohn  Stahl M.D.   On: 04/30/2017 07:57   Dg Chest Port 1 View  Result Date: 04/28/2017 CLINICAL DATA:  Shortness of breath.  Abdominal pain EXAM: PORTABLE CHEST 1 VIEW COMPARISON:  08/24/2016 FINDINGS: Cardiomegaly with normal pulmonary vascularity. Mild bibasilar subsegmental atelectasis. Mild right base infiltrate cannot be excluded. Small right pleural effusion. No pneumothorax. IMPRESSION: 1. Mild bibasilar atelectasis. Mild right base infiltrate cannot be excluded. Small right pleural effusion. 2. Cardiomegaly.  No pulmonary venous congestion . Electronically Signed   By: Maisie Fushomas  Register   On: 04/28/2017 11:28   Mr Abdomen Mrcp W Wo Contast  Result Date: 04/30/2017 CLINICAL DATA:  Abdominal pain.  Acute pancreatitis. EXAM: MRI ABDOMEN WITHOUT AND WITH CONTRAST (INCLUDING MRCP) TECHNIQUE: Multiplanar multisequence MR imaging of the abdomen was performed both before and after the administration of intravenous contrast. Heavily T2-weighted images of the biliary and pancreatic ducts were obtained, and three-dimensional MRCP images were rendered by post processing. CONTRAST:  20 mL MULTIHANCE GADOBENATE DIMEGLUMINE 529 MG/ML IV SOLN COMPARISON:  CT on 08/24/2016 FINDINGS: Lower chest: No acute findings. Hepatobiliary: Mild diffuse hepatic steatosis. No liver masses identified. Prior cholecystectomy noted. Mild diffuse biliary ductal dilatation seen with proximal common bile duct measuring 13 mm.  No evidence of choledocholithiasis or biliary stricture. Pancreas: No evidence of pancreatic ductal dilatation or pancreas divisum. Beaded appearance of the main pancreatic duct is seen as well as pancreatic atrophy, suspicious for chronic pancreatitis. No evidence of acute peripancreatic inflammatory changes or fluid collections. Spleen:  Within normal limits in size and appearance. Adrenals/Urinary Tract: No masses identified. No evidence of hydronephrosis. Stomach/Bowel: Visualized portions within the abdomen are unremarkable. Vascular/Lymphatic: Several small lymph nodes are seen in the porta hepatis and portacaval space, largest measuring 1.5 cm, likely reactive in etiology. No abdominal aortic aneurysm. Retroaortic left renal vein incidentally noted. Other:  None. Musculoskeletal:  No suspicious bone lesions identified. IMPRESSION: Prior cholecystectomy. Mild diffuse biliary ductal dilatation, with common bile duct measuring 13 mm. No radiographic evidence of choledocholithiasis or other obstructing etiology. No radiographic evidence of acute pancreatitis or pancreatic mass. Beaded appearance of main pancreatic duct and diffuse pancreatic atrophy are suggestive of chronic pancreatitis. Mild hepatic steatosis. Electronically Signed   By: Myles RosenthalJohn  Stahl M.D.   On: 04/30/2017 07:57    Time Spent in minutes  25   Eddie NorthHUNGEL, Goebel Hellums M.D on 04/30/2017 at 11:45 AM  Between 7am to 7pm - Pager - [phone]  After 7pm go to [email or web address] - password [email or web address]  [facility] -  Office  [phone]

## 2017-04-30 NOTE — Progress Notes (Signed)
[facility] Progress Note  Chief Complaint:   Pancreatitis / jaundice  ASSESSMENT / PLAN:   56 yo male with recurrent acute pancreatitis. CBD dilated and bilirubin elevated but no evidence for choledocholithiasis on MRCP.  Bili continues to decline, down to 5.8 today. He has a chronically elevated alk phos. No evidence for pancreatitis on MRCP.  -okay to give clears and see how he does.  -WBC has normalized. He is afebrile. No evidence for biliary obstruction by imaging and bili coming down so can probably stop Unasyn.     LOS: 2 days   Tye Savoy ,NP 04/30/2017, 10:56 AM  Pager number [phone]     [facility] Attending   I have taken an interval history, reviewed the chart and examined the patient. I agree with the Advanced Practitioner's note, impression and recommendations.   Pancreatic edema causing biliary obstruction issues that are better with tx of avcute on chronic  pancreatitis. Treat that and dc when oral meds/feeding ok and f/u w/ outside GI MD   Signing off - call if ?  Gatha Mayer, MD, [facility] [phone] (pager) 04/30/2017 1:21 PM  Subjective: Still has diffuse mid/upper abdominal pain radiating through to back but it is better today. Hungry.   Objective:  Vital signs in last 24 hours: Temp:  [97.7 F (36.5 C)-97.8 F (36.6 C)] 97.8 F (36.6 C) (07/01 0600) Pulse Rate:  [49-62] 60 (07/01 0600) Resp:  [16-20] 16 (07/01 0600) BP: (125-144)/(62-69) 125/62 (07/01 0600) SpO2:  [97 %-99 %] 97 % (07/01 0600) Weight:  [203 lb 6.4 oz (92.3 kg)] 203 lb 6.4 oz (92.3 kg) (07/01 0600) Last BM Date: 04/27/17 ([id number]) General:   Alert, well-developed, white male in NAD Heart:  Regular rate and rhythm; no murmurs. no lower extremity edema Pulm: Normal respiratory effort, lungs CTA bilaterally without wheezes or crackles. Abdomen:  Soft, nondistended, mild diffuse mid / upper abdominal tenderness. Nomal bowel sounds     Neurologic:  Alert and  oriented x4;  grossly normal neurologically. Psych:  Pleasant, cooperative.  Normal mood and affect.   Intake/Output from previous day: 06/30 0701 - 07/01 0700 In: 3749.7 [I.V.:3749.7] Out: 4250 [Urine:4250] Intake/Output this shift: Total I/O In: 939.1 [P.O.:120; I.V.:419.1; IV Piggyback:400] Out: 900 [Urine:900]   LFT  Recent Labs  04/30/17 0247  PROT 6.0*  ALBUMIN 2.3*  AST 88*  ALT 76*  ALKPHOS 190*  BILITOT 5.8*    Mr Abdomen Mrcp W Wo Contast  Result Date: 04/30/2017 CLINICAL DATA:  Abdominal pain.  Acute pancreatitis. EXAM: MRI ABDOMEN WITHOUT AND WITH CONTRAST (INCLUDING MRCP) TECHNIQUE: Multiplanar multisequence MR imaging of the abdomen was performed both before and after the administration of intravenous contrast. Heavily T2-weighted images of the biliary and pancreatic ducts were obtained, and three-dimensional MRCP images were rendered by post processing. CONTRAST:  20 mL MULTIHANCE GADOBENATE DIMEGLUMINE 529 MG/ML IV SOLN COMPARISON:  CT on 08/24/2016 FINDINGS: Lower chest: No acute findings. Hepatobiliary: Mild diffuse hepatic steatosis. No liver masses identified. Prior cholecystectomy noted. Mild diffuse biliary ductal dilatation seen with proximal common bile duct measuring 13 mm. No evidence of choledocholithiasis or biliary stricture. Pancreas: No evidence of pancreatic ductal dilatation or pancreas divisum. Beaded appearance of the main pancreatic duct is seen as well as pancreatic atrophy, suspicious for chronic pancreatitis. No evidence of acute peripancreatic inflammatory changes or fluid collections. Spleen:  Within normal limits in size and appearance. Adrenals/Urinary Tract: No masses identified. No evidence of hydronephrosis. Stomach/Bowel:  Visualized portions within the abdomen are unremarkable. Vascular/Lymphatic: Several small lymph nodes are seen in the porta hepatis and portacaval space, largest measuring 1.5 cm, likely reactive  in etiology. No abdominal aortic aneurysm. Retroaortic left renal vein incidentally noted. Other:  None. Musculoskeletal:  No suspicious bone lesions identified. IMPRESSION: Prior cholecystectomy. Mild diffuse biliary ductal dilatation, with common bile duct measuring 13 mm. No radiographic evidence of choledocholithiasis or other obstructing etiology. No radiographic evidence of acute pancreatitis or pancreatic mass. Beaded appearance of main pancreatic duct and diffuse pancreatic atrophy are suggestive of chronic pancreatitis. Mild hepatic steatosis. Electronically Signed   By: Earle Gell M.D.   On: 04/30/2017 07:57

## 2017-04-30 NOTE — Consult Note (Signed)
WOC Nurse wound consult note Reason for Consult: Right great toe and foot fissures, recently healed areas on left foot, plantar aspect. Patient informs me that he has "peripheral arterial disease".  He performs his own foot and nail care and reports he healed the wounds on the left foot. Wound type: Arterial insufficiency/PAD Pressure Injury POA: No. Wounds present on admission, not pressure related. Measurement: Right foot with three (3) fissures, the largest of which measures 1.4 x 0.1cm x 0.1cm. Poor hygiene noted on bilateral feet. Left foot with two areas of plantar  Wounds noted, recently reepithelialized. Dry, cracked and with poor hygiene. Wound bed: Dry. Drainage (amount, consistency, odor) None Periwound: Dry, intact with a few areas with dried serum (scabbing). Dressing procedure/placement/frequency: I have provided Nursing with guidance for care via the Orders using twice daily cleansing with house skin care products (pH balanced, no rinse) and following with application of Eucerin cream. The right great toe will be treated with a single saline dampened gauze 2x2 topped with a dry gauze dressing and secured with Kerlix roll gauze. WOC nursing team will not follow, but will remain available to this patient, the nursing and medical teams.  Please re-consult if needed. Thanks, Ladona MowLaurie Lissete Maestas, MSN, RN, GNP, Hans EdenCWOCN, CWON-AP, FAAN  Pager# [phone]

## 2017-04-30 NOTE — Progress Notes (Signed)
Pt requests italian ice to eat this morning, advised he is NPO with sips/chips for meds. Pt states he tolerated the several [place] he received last night without problems.   Will await MD rounding/order changes before providing more.

## 2017-04-30 NOTE — Progress Notes (Signed)
ANTICOAGULATION CONSULT NOTE  Pharmacy Consult for heparin (holding rivaroxaban) Indication: hx of factor v  Allergies  Allergen Reactions  . Reglan [Metoclopramide] Other (See Comments)    Muscle twitches.    Patient Measurements: Height: 6' (182.9 cm) Weight: 203 lb 6.4 oz (92.3 kg) IBW/kg (Calculated) : 77.6 Heparin Dosing Weight: 93 kg  Vital Signs: Temp: 97.8 F (36.6 C) (07/01 0600) Temp Source: Oral (07/01 0600) BP: 125/62 (07/01 0600) Pulse Rate: 60 (07/01 0600)  Labs:  Recent Labs  04/28/17 1320 04/28/17 2115 04/29/17 0255 04/29/17 1000 04/29/17 1559 04/30/17 0247  HGB 12.2*  --  11.8*  --   --  11.9*  HCT 36.6*  --  35.7*  --   --  36.0*  PLT 260  --  274  --   --  312  APTT 34 46*  --   --   --   --   LABPROT 15.4*  --   --   --   --   --   INR 1.22  --   --   --   --   --   HEPARINUNFRC <0.10* 0.15* 0.19* 0.38 0.37 0.30  CREATININE 0.84  --  0.86  --   --  0.75    Estimated Creatinine Clearance: 111.8 mL/min (by C-G formula based on SCr of 0.75 mg/dL).  Assessment: CC/HPI: 56 yo m presenting with abdominal pain on xarelto pta for hx of factor v - last dose 1700 6/28. Pharmacy consulted to initiate hep gtt in hospital.   Heparin level remains therapeutic but trended down to low end of goal on 2000 units/hr. CBC stable with no reported s/s bleeding.  Goal of Therapy:  Heparin level 0.3-0.7 units/ml Monitor platelets by anticoagulation protocol: Yes   Plan:  Increase heparin gtt slightly to 2100 units/hr to maintain in goal range Daily HL, CBC F/u restart Xarelto   Clide Remmers K. Bonnye FavaNicolsen, PharmD, BCPS, CPP Clinical Pharmacist Pager: [phone] Phone: [phone] 04/30/2017 9:13 AM

## 2017-05-01 DIAGNOSIS — K859 Acute pancreatitis without necrosis or infection, unspecified: Principal | ICD-10-CM

## 2017-05-01 LAB — COMPREHENSIVE METABOLIC PANEL
ALBUMIN: 2.5 g/dL — AB (ref 3.5–5.0)
ALK PHOS: 187 U/L — AB (ref 38–126)
ALT: 73 U/L — ABNORMAL HIGH (ref 17–63)
AST: 87 U/L — ABNORMAL HIGH (ref 15–41)
Anion gap: 6 (ref 5–15)
CALCIUM: 8.8 mg/dL — AB (ref 8.9–10.3)
CO2: 26 mmol/L (ref 22–32)
CREATININE: 0.91 mg/dL (ref 0.61–1.24)
Chloride: 103 mmol/L (ref 101–111)
GFR calc non Af Amer: >60 mL/min (ref >60)
GLUCOSE: 94 mg/dL (ref 65–99)
Potassium: 3.6 mmol/L (ref 3.5–5.1)
SODIUM: 135 mmol/L (ref 135–145)
Total Bilirubin: 4.9 mg/dL — ABNORMAL HIGH (ref 0.3–1.2)
Total Protein: 6.3 g/dL — ABNORMAL LOW (ref 6.5–8.1)

## 2017-05-01 LAB — HEPARIN LEVEL (UNFRACTIONATED): Heparin Unfractionated: 0.55 IU/mL (ref 0.30–0.70)

## 2017-05-01 LAB — CBC
HCT: 38.1 % — ABNORMAL LOW (ref 39.0–52.0)
HEMOGLOBIN: 12.9 g/dL — AB (ref 13.0–17.0)
MCH: 30.4 pg (ref 26.0–34.0)
MCHC: 33.9 g/dL (ref 30.0–36.0)
MCV: 89.9 fL (ref 78.0–100.0)
Platelets: 290 10*3/uL (ref 150–400)
RBC: 4.24 MIL/uL (ref 4.22–5.81)
RDW: 14.2 % (ref 11.5–15.5)
WBC: 6.6 10*3/uL (ref 4.0–10.5)

## 2017-05-01 MED ORDER — RIVAROXABAN 20 MG PO TABS
20.0000 mg | ORAL_TABLET | Freq: Every day | ORAL | Status: DC
  Administered 2017-05-01 – 2017-05-03 (×3): 20 mg via ORAL
  Filled 2017-05-01 (×3): qty 1

## 2017-05-01 MED ORDER — GUAIFENESIN-DM 100-10 MG/5ML PO SYRP
5.0000 mL | ORAL_SOLUTION | ORAL | Status: DC | PRN
  Administered 2017-05-02 – 2017-05-03 (×5): 5 mL via ORAL
  Filled 2017-05-01 (×5): qty 5

## 2017-05-01 MED ORDER — HYDROCODONE-ACETAMINOPHEN 5-325 MG PO TABS
1.0000 | ORAL_TABLET | ORAL | Status: DC | PRN
  Administered 2017-05-01 – 2017-05-03 (×3): 2 via ORAL
  Filled 2017-05-01 (×3): qty 2

## 2017-05-01 MED ORDER — HYDROCODONE-ACETAMINOPHEN 5-325 MG PO TABS: 1.0000 | ORAL_TABLET | Freq: Four times a day (QID) | ORAL | Status: DC | PRN

## 2017-05-01 MED ORDER — MORPHINE SULFATE ER 15 MG PO TBCR
15.0000 mg | EXTENDED_RELEASE_TABLET | Freq: Two times a day (BID) | ORAL | Status: DC
  Administered 2017-05-01 – 2017-05-03 (×4): 15 mg via ORAL
  Filled 2017-05-01 (×4): qty 1

## 2017-05-01 NOTE — Progress Notes (Signed)
Pt. Requesting med for cough. [facility] text page.

## 2017-05-01 NOTE — Discharge Instructions (Signed)
Information on my medicine - XARELTO (Rivaroxaban)  This medication education was reviewed with me or my healthcare representative as part of my discharge preparation.  The pharmacist that spoke with me during my hospital stay was:  Toniann Failony L Atalaya Zappia, RPH  Why was Xarelto prescribed for you? Xarelto was prescribed for you to reduce the risk of blood clots forming after orthopedic surgery. The medical term for these abnormal blood clots is venous thromboembolism (VTE).  What do you need to know about xarelto ? Take your Xarelto ONCE DAILY at the same time every day. You may take it either with or without food.  If you have difficulty swallowing the tablet whole, you may crush it and mix in applesauce just prior to taking your dose.  Take Xarelto exactly as prescribed by your doctor and DO NOT stop taking Xarelto without talking to the doctor who prescribed the medication.  Stopping without other VTE prevention medication to take the place of Xarelto may increase your risk of developing a clot.  After discharge, you should have regular check-up appointments with your healthcare provider that is prescribing your Xarelto.    What do you do if you miss a dose? If you miss a dose, take it as soon as you remember on the same day then continue your regularly scheduled once daily regimen the next day. Do not take two doses of Xarelto on the same day.   Important Safety Information A possible side effect of Xarelto is bleeding. You should call your healthcare provider right away if you experience any of the following: ? Bleeding from an injury or your nose that does not stop. ? Unusual colored urine (red or dark brown) or unusual colored stools (red or black). ? Unusual bruising for unknown reasons. ? A serious fall or if you hit your head (even if there is no bleeding).  Some medicines may interact with Xarelto and might increase your risk of bleeding while on Xarelto. To help avoid  this, consult your healthcare provider or pharmacist prior to using any new prescription or non-prescription medications, including herbals, vitamins, non-steroidal anti-inflammatory drugs (NSAIDs) and supplements.  This website has more information on Xarelto: [email or web address].[email or web address].

## 2017-05-01 NOTE — Progress Notes (Addendum)
PROGRESS NOTE                                                                                                                                                                                                             Patient Demographics:    Alex Hoffman, is a 56 y.o. male, DOB - 10/02/1961, ZOX:[id number]  Admit date - 04/28/2017   Admitting Physician Bobette Mo, MD  Outpatient Primary MD for the patient is Patient, No Pcp Per  LOS - 3  Outpatient Specialists:None  No chief complaint on file.      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 In the [facility] ED labs showed transaminitis with AST and ALT in 200s, lipase of 3100, total bilirubin of 12 and white count of 21K. workup including CT and ultrasound of the abdomen was nonacute with fatty changes of the liver. No CBD stone. GI consulted and patient transferred to [facility].  Subjective:   Abdominal pain seems to be slowly improving. No nausea or vomiting.  Assessment  & Plan :   Principal problem Acute recurrent pancreatitis Suspect alcoholic pancreatitis. Has had cholecystectomy for prior gallstone pancreatitis. -Tolerating clear liquid. Off IV fluids. LFTs slowly trending down. Lipase normal. - pain control with MS Contin and when necessary Vicodin. (No need to escalate pain medications) -MRCP shows dilated CBD without evidence of pancreatitis or obstruction. No signs of cholangitis, antibiotics discontinued.  [facility]  consult appreciated. Patient was apparently fired from [facility] for failing pain contract.   Acute transaminitis with severe jaundice Suspect distal CBD obstruction from acute pancreatitis. Alcoholic hepatitis is another possibility. LFTs trending down. MRCP without CBD obstruction or active pancreatitis or necrosis.  Factor V lesion deficiency with blood clots, on chronic anticoagulation Resume Xarelto.  Chronic pain with narcotic seeking behavior On MS Contin and when necessary Vicodin. Reduce MS Contin dose to 15 mg twice a day.   Essential hypertension Continue amlodipine.  Ongoing smoking and alcohol use Counseled  strongly on cessation. Nicotine patch. No signs of alcohol withdrawal.  ? peripheral vascular disease with right foot wound Seen by wound care. Recommend clean superficial dressing.  Code Status : Full code  Family Communication  : None at bedside  Disposition Plan  : Home possibly in the next 48 hours if tolerating advanced diet and pain improving  Barriers For Discharge : Active symptoms  Consults  :  [facility]  Procedures  : MRCP    DVT Prophylaxis  : IV heparin  Lab Results  Component Value Date   PLT 290 05/01/2017    Antibiotics  :    Anti-infectives    Start     Dose/Rate Route Frequency Ordered Stop   04/28/17 2000  Ampicillin-Sulbactam (UNASYN) 3 g in sodium chloride 0.9 % 100 mL IVPB  Status:  Discontinued     3 g 200 mL/hr over 30 Minutes Intravenous Every 8 hours 04/28/17 1426 04/30/17 1322   04/28/17 1130  Ampicillin-Sulbactam (UNASYN) 3 g in sodium chloride 0.9 % 100 mL IVPB     3 g 200 mL/hr over 30 Minutes Intravenous  Once 04/28/17 1123 04/28/17 1332        Objective:   Vitals:   04/30/17 2000 05/01/17 0000 05/01/17 0346 05/01/17 0547  BP: 131/67 125/65 (!) 135/48 135/69  Pulse:  63 61 66  Resp: 17     Temp: 99.1 F (37.3 C)  98.3 F (36.8 C)   TempSrc: Oral  Oral   SpO2: 95%  96%   Weight:   92.1 kg (203 lb)     Height:        Wt Readings from Last 3 Encounters:  05/01/17 92.1 kg (203 lb)  08/24/16 93 kg (205 lb)  08/23/16 93.7 kg (206 lb 9.6 oz)     Intake/Output Summary (Last 24 hours) at 05/01/17 1012 Last data filed at 05/01/17 0947  Gross per 24 hour  Intake          4581.35 ml  Output             4800 ml  Net          -218.65 ml     Physical Exam Gen.: Middle aged man not in distress HEENT: Moist mucosa, supple neck, icteric  chest: Clear bilaterally CVS: Normal S1 and S2, no murmurs GI: Soft, nondistended, abdominal tenderness (mostly epigastric), bowel sounds present musculoskeletal: Chronic right foot arterial wound (noninfected)    Data Review:    CBC  Recent Labs Lab 04/28/17 1320 04/29/17 0255 04/30/17 0247 05/01/17 0431  WBC 12.1* 10.3 7.2 6.6  HGB 12.2* 11.8* 11.9* 12.9*  HCT 36.6* 35.7* 36.0* 38.1*  PLT 260 274 312 290  MCV 91.0 90.8 90.5 89.9  MCH 30.3 30.0 29.9 30.4  MCHC 33.3 33.1 33.1 33.9  RDW 14.3 14.4 14.2 14.2    Chemistries   Recent Labs Lab 04/28/17 1320 04/29/17 0255 04/30/17 0247 04/30/17 0730 05/01/17 0431  NA 135 135 136  --  135  K 3.4* 3.6 3.4*  --  3.6  CL 105 104 101  --  103  CO2 24 23 24   --  26  GLUCOSE 87 56* 47*  --  94  BUN 12 7 7   --  <5*  CREATININE 0.84 0.86 0.75  --  0.91  CALCIUM 8.5* 8.4* 8.5*  --  8.8*  MG  --   --   --  1.8  --   AST 99* 91* 88*  --  87*  ALT 93* 83* 76*  --  73*  ALKPHOS 214* 198* 190*  --  187*  BILITOT 8.5* 7.2* 5.8*  --  4.9*   ------------------------------------------------------------------------------------------------------------------  Recent Labs  04/29/17 0255  CHOL 187  HDL <10*  LDLCALC NOT CALCULATED  TRIG 333*  CHOLHDL NOT CALCULATED    No results found for: HGBA1C ------------------------------------------------------------------------------------------------------------------ No results for input(s): TSH, T4TOTAL, T3FREE, THYROIDAB in the last 72  hours.  Invalid input(s): FREET3 ------------------------------------------------------------------------------------------------------------------ No results for input(s): VITAMINB12, FOLATE, FERRITIN, TIBC, IRON, RETICCTPCT in the last 72 hours.  Coagulation profile  Recent Labs Lab 04/28/17 1320  INR 1.22    No results for input(s): DDIMER in the last 72 hours.  Cardiac Enzymes No results for input(s): CKMB, TROPONINI, MYOGLOBIN in the last 168 hours.  Invalid input(s): CK ------------------------------------------------------------------------------------------------------------------ No results found for: BNP  Inpatient Medications  Scheduled Meds: . amLODipine  10 mg Oral Daily  . folic acid  1 mg Oral Daily  . hydrocerin   Topical BID  . morphine  30 mg Oral Q12H  . multivitamin with minerals  1 tablet Oral Daily  . nicotine  21 mg Transdermal Daily  . pantoprazole  40 mg Oral Daily  . rivaroxaban  20 mg Oral Daily  . sodium chloride flush  3 mL Intravenous Q12H  . thiamine  100 mg Oral Daily   Or  . thiamine  100 mg Intravenous Daily   Continuous Infusions: . lactated ringers 150 mL/hr at 05/01/17 0621  . sodium chloride     PRN Meds:.bisacodyl, HYDROcodone-acetaminophen, LORazepam **OR** LORazepam, ondansetron **OR** ondansetron (ZOFRAN) IV, sodium chloride, zolpidem  Micro Results Recent Results (from the past 240 hour(s))  MRSA PCR Screening     Status: None   Collection Time: 04/28/17  9:15 AM  Result Value Ref Range Status   MRSA by PCR NEGATIVE NEGATIVE Final    Comment:        The GeneXpert MRSA Assay (FDA approved for NASAL specimens only), is one component of a comprehensive MRSA colonization surveillance program. It is not intended to diagnose MRSA infection nor to guide or monitor treatment for MRSA infections.   Culture, blood (routine x 2)     Status: None (Preliminary result)   Collection Time: 04/28/17  1:20 PM  Result Value Ref  Range Status   Specimen Description BLOOD LEFT ARM  Final   Special Requests IN PEDIATRIC BOTTLE Blood Culture adequate volume  Final   Culture NO GROWTH 2 DAYS  Final   Report Status PENDING  Incomplete  Culture, blood (routine x 2)     Status: None (Preliminary result)   Collection Time: 04/28/17  1:20 PM  Result Value Ref Range Status   Specimen Description BLOOD RIGHT HAND  Final   Special Requests IN PEDIATRIC BOTTLE Blood Culture adequate volume  Final   Culture NO GROWTH 2 DAYS  Final   Report Status PENDING  Incomplete    Radiology Reports Mr 3d Recon At Scanner  Result Date: 04/30/2017 CLINICAL DATA:  Abdominal pain.  Acute pancreatitis. EXAM: MRI ABDOMEN WITHOUT AND WITH CONTRAST (INCLUDING MRCP) TECHNIQUE: Multiplanar multisequence MR imaging of the abdomen was performed both before and after the administration of intravenous contrast. Heavily T2-weighted images of the biliary and pancreatic ducts were obtained, and three-dimensional MRCP images were rendered by post processing. CONTRAST:  20 mL MULTIHANCE GADOBENATE DIMEGLUMINE 529 MG/ML IV SOLN COMPARISON:  CT on 08/24/2016 FINDINGS: Lower chest: No acute findings. Hepatobiliary: Mild diffuse  hepatic steatosis. No liver masses identified. Prior cholecystectomy noted. Mild diffuse biliary ductal dilatation seen with proximal common bile duct measuring 13 mm. No evidence of choledocholithiasis or biliary stricture. Pancreas: No evidence of pancreatic ductal dilatation or pancreas divisum. Beaded appearance of the main pancreatic duct is seen as well as pancreatic atrophy, suspicious for chronic pancreatitis. No evidence of acute peripancreatic inflammatory changes or fluid collections. Spleen:  Within normal limits in size and appearance. Adrenals/Urinary Tract: No masses identified. No evidence of hydronephrosis. Stomach/Bowel: Visualized portions within the abdomen are unremarkable. Vascular/Lymphatic: Several small lymph nodes are seen  in the porta hepatis and portacaval space, largest measuring 1.5 cm, likely reactive in etiology. No abdominal aortic aneurysm. Retroaortic left renal vein incidentally noted. Other:  None. Musculoskeletal:  No suspicious bone lesions identified. IMPRESSION: Prior cholecystectomy. Mild diffuse biliary ductal dilatation, with common bile duct measuring 13 mm. No radiographic evidence of choledocholithiasis or other obstructing etiology. No radiographic evidence of acute pancreatitis or pancreatic mass. Beaded appearance of main pancreatic duct and diffuse pancreatic atrophy are suggestive of chronic pancreatitis. Mild hepatic steatosis. Electronically Signed   By: Myles Rosenthal M.D.   On: 04/30/2017 07:57   Dg Chest Port 1 View  Result Date: 04/28/2017 CLINICAL DATA:  Shortness of breath.  Abdominal pain EXAM: PORTABLE CHEST 1 VIEW COMPARISON:  08/24/2016 FINDINGS: Cardiomegaly with normal pulmonary vascularity. Mild bibasilar subsegmental atelectasis. Mild right base infiltrate cannot be excluded. Small right pleural effusion. No pneumothorax. IMPRESSION: 1. Mild bibasilar atelectasis. Mild right base infiltrate cannot be excluded. Small right pleural effusion. 2. Cardiomegaly.  No pulmonary venous congestion . Electronically Signed   By: Maisie Fus  Register   On: 04/28/2017 11:28   Mr Abdomen Mrcp W Wo Contast  Result Date: 04/30/2017 CLINICAL DATA:  Abdominal pain.  Acute pancreatitis. EXAM: MRI ABDOMEN WITHOUT AND WITH CONTRAST (INCLUDING MRCP) TECHNIQUE: Multiplanar multisequence MR imaging of the abdomen was performed both before and after the administration of intravenous contrast. Heavily T2-weighted images of the biliary and pancreatic ducts were obtained, and three-dimensional MRCP images were rendered by post processing. CONTRAST:  20 mL MULTIHANCE GADOBENATE DIMEGLUMINE 529 MG/ML IV SOLN COMPARISON:  CT on 08/24/2016 FINDINGS: Lower chest: No acute findings. Hepatobiliary: Mild diffuse hepatic  steatosis. No liver masses identified. Prior cholecystectomy noted. Mild diffuse biliary ductal dilatation seen with proximal common bile duct measuring 13 mm. No evidence of choledocholithiasis or biliary stricture. Pancreas: No evidence of pancreatic ductal dilatation or pancreas divisum. Beaded appearance of the main pancreatic duct is seen as well as pancreatic atrophy, suspicious for chronic pancreatitis. No evidence of acute peripancreatic inflammatory changes or fluid collections. Spleen:  Within normal limits in size and appearance. Adrenals/Urinary Tract: No masses identified. No evidence of hydronephrosis. Stomach/Bowel: Visualized portions within the abdomen are unremarkable. Vascular/Lymphatic: Several small lymph nodes are seen in the porta hepatis and portacaval space, largest measuring 1.5 cm, likely reactive in etiology. No abdominal aortic aneurysm. Retroaortic left renal vein incidentally noted. Other:  None. Musculoskeletal:  No suspicious bone lesions identified. IMPRESSION: Prior cholecystectomy. Mild diffuse biliary ductal dilatation, with common bile duct measuring 13 mm. No radiographic evidence of choledocholithiasis or other obstructing etiology. No radiographic evidence of acute pancreatitis or pancreatic mass. Beaded appearance of main pancreatic duct and diffuse pancreatic atrophy are suggestive of chronic pancreatitis. Mild hepatic steatosis. Electronically Signed   By: Myles Rosenthal M.D.   On: 04/30/2017 07:57    Time Spent in minutes  25   Eddie North M.D on  05/01/2017 at 10:12 AM  Between 7am to 7pm - Pager - [phone]  After 7pm go to [email or web address] - password [facility]  [facility] -  Office  [phone]

## 2017-05-02 LAB — COMPREHENSIVE METABOLIC PANEL
ALT: 73 U/L — AB (ref 17–63)
AST: 83 U/L — AB (ref 15–41)
Albumin: 2.4 g/dL — ABNORMAL LOW (ref 3.5–5.0)
Alkaline Phosphatase: 173 U/L — ABNORMAL HIGH (ref 38–126)
Anion gap: 8 (ref 5–15)
BILIRUBIN TOTAL: 3.2 mg/dL — AB (ref 0.3–1.2)
CHLORIDE: 106 mmol/L (ref 101–111)
CO2: 23 mmol/L (ref 22–32)
CREATININE: 0.7 mg/dL (ref 0.61–1.24)
Calcium: 8.9 mg/dL (ref 8.9–10.3)
GFR calc Af Amer: >60 mL/min (ref >60)
Glucose, Bld: 93 mg/dL (ref 65–99)
Potassium: 3.8 mmol/L (ref 3.5–5.1)
Sodium: 137 mmol/L (ref 135–145)
Total Protein: 6.2 g/dL — ABNORMAL LOW (ref 6.5–8.1)

## 2017-05-02 NOTE — Progress Notes (Signed)
PROGRESS NOTE                                                                                                                                                                                                             Patient Demographics:    Alex Hoffman, is a 56 y.o. male, DOB - 07-23-1961, ZOX:[id number]  Admit date - 04/28/2017   Admitting Physician Bobette Mo, MD  Outpatient Primary MD for the patient is Patient, No Pcp Per  LOS - 4  Outpatient Specialists:None  No chief complaint on file.      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 In the [facility] ED labs showed transaminitis with AST and ALT in 200s, lipase of 3100, total bilirubin of 12 and white count of 21K. workup including CT and ultrasound of the abdomen was nonacute with fatty changes of the liver. No CBD stone. GI consulted and patient transferred to [facility].  Subjective:   Still complains of abdominal pain slowly improving. No nausea or vomiting. Tolerating full liquid.   Assessment  & Plan :   Principal problem Acute recurrent pancreatitis Suspect alcoholic pancreatitis. Has had cholecystectomy for prior gallstone pancreatitis. -Tolerating full liquid. LFTs continue to improve.. - pain control with MS Contin and when necessary Vicodin. (No need to escalate pain medications) -MRCP shows dilated CBD without evidence of pancreatitis or obstruction. No signs of cholangitis, antibiotics discontinued.  [facility] consult appreciated. Patient was apparently fired from [employer] for failing pain contract.   Acute transaminitis with severe jaundice Suspect distal CBD obstruction from acute pancreatitis. Alcoholic hepatitis is another possibility. LFTs continue to improve. MRCP without CBD obstruction or active pancreatitis or necrosis.  Factor V lesion deficiency with blood clots, on chronic anticoagulation Continue Xarelto.  Chronic pain with narcotic seeking behavior Continue current dose of MS Contin and when necessary Vicodin for pain. (Do not escalate). Upon discharge and resume his home dose Vicodin.   Essential hypertension Continue amlodipine.  Ongoing smoking and alcohol use Counseled strongly on cessation. Nicotine patch applied. No signs of alcohol withdrawal.  ? peripheral vascular disease with right foot wound Seen by wound care. No signs of infection or discharge. Recommend clean superficial dressing.  Code Status : Full code  Family Communication  : None at bedside  Disposition Plan  : Home possibly tomorrow if tolerating advanced diet and LFTs continued to improve.  Barriers For Discharge : Active symptoms  Consults  :  [facility]  Procedures  : MRCP    DVT Prophylaxis  : IV heparin  Lab Results  Component Value Date   PLT 290 05/01/2017    Antibiotics  :    Anti-infectives    Start     Dose/Rate Route Frequency Ordered Stop   04/28/17 2000  Ampicillin-Sulbactam (UNASYN) 3 g in sodium chloride 0.9 % 100 mL IVPB  Status:  Discontinued     3 g 200 mL/hr over 30 Minutes Intravenous Every 8 hours 04/28/17 1426 04/30/17 1322   04/28/17 1130  Ampicillin-Sulbactam (UNASYN) 3 g in sodium chloride 0.9 % 100 mL IVPB     3 g 200 mL/hr over 30 Minutes Intravenous  Once 04/28/17 1123 04/28/17 1332        Objective:   Vitals:   05/01/17 0547 05/01/17 1219 05/01/17 1914 05/02/17 0544  BP: 135/69 132/68 (!) 144/78 138/71  Pulse: 66 (!) 56 (!) 58 (!) 55  Resp:  18  18 18   Temp:  98 F (36.7 C) 97.5 F (36.4 C) 97.5 F (36.4 C)  TempSrc:  Oral Oral Oral  SpO2:  96% 98% 94%  Weight:    91.3 kg (201 lb 3.2 oz)  Height:        Wt Readings from Last 3 Encounters:  05/02/17 91.3 kg (201 lb 3.2 oz)  08/24/16 93 kg (205 lb)  08/23/16 93.7 kg (206 lb 9.6 oz)     Intake/Output Summary (Last 24 hours) at 05/02/17 1146 Last data filed at 05/02/17 0846  Gross per 24 hour  Intake             3630 ml  Output             3000 ml  Net              630 ml     Physical Exam Gen.: Middle aged male not in distress HEENT: Moist mucosa, supple neck Chest: Clear bilaterally CVS: Normal S1 and S2, no murmurs GI: Soft, nondistended, epigastric tenderness, bowel sounds present Musculoskeletal: Warm, no edema, right plantar fissure, no bleeding or discharge.     Data Review:    CBC  Recent Labs Lab 04/28/17 1320 04/29/17 0255 04/30/17 0247 05/01/17 0431  WBC 12.1* 10.3 7.2 6.6  HGB 12.2* 11.8* 11.9* 12.9*  HCT 36.6* 35.7* 36.0* 38.1*  PLT 260 274 312 290  MCV 91.0 90.8 90.5 89.9  MCH 30.3 30.0 29.9 30.4  MCHC 33.3 33.1 33.1 33.9  RDW 14.3 14.4 14.2 14.2    Chemistries   Recent Labs Lab 04/28/17 1320 04/29/17 0255 04/30/17 0247 04/30/17 0730 05/01/17 0431 05/02/17 0335  NA 135 135 136  --  135 137  K 3.4* 3.6 3.4*  --  3.6 3.8  CL 105 104 101  --  103 106  CO2 24 23 24   --  26 23  GLUCOSE 87 56* 47*  --  94 93  BUN 12 7 7   --  <5* <5*  CREATININE  0.84 0.86 0.75  --  0.91 0.70  CALCIUM 8.5* 8.4* 8.5*  --  8.8* 8.9  MG  --   --   --  1.8  --   --   AST 99* 91* 88*  --  87* 83*  ALT 93* 83* 76*  --  73* 73*  ALKPHOS 214* 198* 190*  --  187* 173*  BILITOT 8.5* 7.2* 5.8*  --  4.9* 3.2*   ------------------------------------------------------------------------------------------------------------------ No results for input(s): CHOL, HDL, LDLCALC, TRIG, CHOLHDL, LDLDIRECT in the last 72 hours.  No results found for:  HGBA1C ------------------------------------------------------------------------------------------------------------------ No results for input(s): TSH, T4TOTAL, T3FREE, THYROIDAB in the last 72 hours.  Invalid input(s): FREET3 ------------------------------------------------------------------------------------------------------------------ No results for input(s): VITAMINB12, FOLATE, FERRITIN, TIBC, IRON, RETICCTPCT in the last 72 hours.  Coagulation profile  Recent Labs Lab 04/28/17 1320  INR 1.22    No results for input(s): DDIMER in the last 72 hours.  Cardiac Enzymes No results for input(s): CKMB, TROPONINI, MYOGLOBIN in the last 168 hours.  Invalid input(s): CK ------------------------------------------------------------------------------------------------------------------ No results found for: BNP  Inpatient Medications  Scheduled Meds: . amLODipine  10 mg Oral Daily  . folic acid  1 mg Oral Daily  . hydrocerin   Topical BID  . morphine  15 mg Oral Q12H  . multivitamin with minerals  1 tablet Oral Daily  . nicotine  21 mg Transdermal Daily  . pantoprazole  40 mg Oral Daily  . rivaroxaban  20 mg Oral Daily  . sodium chloride flush  3 mL Intravenous Q12H  . thiamine  100 mg Oral Daily   Continuous Infusions:  PRN Meds:.bisacodyl, guaiFENesin-dextromethorphan, HYDROcodone-acetaminophen, ondansetron **OR** ondansetron (ZOFRAN) IV, zolpidem  Micro Results Recent Results (from the past 240 hour(s))  MRSA PCR Screening     Status: None   Collection Time: 04/28/17  9:15 AM  Result Value Ref Range Status   MRSA by PCR NEGATIVE NEGATIVE Final    Comment:        The GeneXpert MRSA Assay (FDA approved for NASAL specimens only), is one component of a comprehensive MRSA colonization surveillance program. It is not intended to diagnose MRSA infection nor to guide or monitor treatment for MRSA infections.   Culture, blood (routine x 2)     Status: None (Preliminary  result)   Collection Time: 04/28/17  1:20 PM  Result Value Ref Range Status   Specimen Description BLOOD LEFT ARM  Final   Special Requests IN PEDIATRIC BOTTLE Blood Culture adequate volume  Final   Culture NO GROWTH 3 DAYS  Final   Report Status PENDING  Incomplete  Culture, blood (routine x 2)     Status: None (Preliminary result)   Collection Time: 04/28/17  1:20 PM  Result Value Ref Range Status   Specimen Description BLOOD RIGHT HAND  Final   Special Requests IN PEDIATRIC BOTTLE Blood Culture adequate volume  Final   Culture NO GROWTH 3 DAYS  Final   Report Status PENDING  Incomplete    Radiology Reports Mr 3d Recon At Scanner  Result Date: 04/30/2017 CLINICAL DATA:  Abdominal pain.  Acute pancreatitis. EXAM: MRI ABDOMEN WITHOUT AND WITH CONTRAST (INCLUDING MRCP) TECHNIQUE: Multiplanar multisequence MR imaging of the abdomen was performed both before and after the administration of intravenous contrast. Heavily T2-weighted images of the biliary and pancreatic ducts were obtained, and three-dimensional MRCP images were rendered by post processing. CONTRAST:  20 mL MULTIHANCE GADOBENATE DIMEGLUMINE 529 MG/ML IV SOLN COMPARISON:  CT on 08/24/2016 FINDINGS:  Lower chest: No acute findings. Hepatobiliary: Mild diffuse hepatic steatosis. No liver masses identified. Prior cholecystectomy noted. Mild diffuse biliary ductal dilatation seen with proximal common bile duct measuring 13 mm. No evidence of choledocholithiasis or biliary stricture. Pancreas: No evidence of pancreatic ductal dilatation or pancreas divisum. Beaded appearance of the main pancreatic duct is seen as well as pancreatic atrophy, suspicious for chronic pancreatitis. No evidence of acute peripancreatic inflammatory changes or fluid collections. Spleen:  Within normal limits in size and appearance. Adrenals/Urinary Tract: No masses identified. No evidence of hydronephrosis. Stomach/Bowel: Visualized portions within the abdomen are  unremarkable. Vascular/Lymphatic: Several small lymph nodes are seen in the porta hepatis and portacaval space, largest measuring 1.5 cm, likely reactive in etiology. No abdominal aortic aneurysm. Retroaortic left renal vein incidentally noted. Other:  None. Musculoskeletal:  No suspicious bone lesions identified. IMPRESSION: Prior cholecystectomy. Mild diffuse biliary ductal dilatation, with common bile duct measuring 13 mm. No radiographic evidence of choledocholithiasis or other obstructing etiology. No radiographic evidence of acute pancreatitis or pancreatic mass. Beaded appearance of main pancreatic duct and diffuse pancreatic atrophy are suggestive of chronic pancreatitis. Mild hepatic steatosis. Electronically Signed   By: Myles Rosenthal M.D.   On: 04/30/2017 07:57   Dg Chest Port 1 View  Result Date: 04/28/2017 CLINICAL DATA:  Shortness of breath.  Abdominal pain EXAM: PORTABLE CHEST 1 VIEW COMPARISON:  08/24/2016 FINDINGS: Cardiomegaly with normal pulmonary vascularity. Mild bibasilar subsegmental atelectasis. Mild right base infiltrate cannot be excluded. Small right pleural effusion. No pneumothorax. IMPRESSION: 1. Mild bibasilar atelectasis. Mild right base infiltrate cannot be excluded. Small right pleural effusion. 2. Cardiomegaly.  No pulmonary venous congestion . Electronically Signed   By: Maisie Fus  Register   On: 04/28/2017 11:28   Mr Abdomen Mrcp W Wo Contast  Result Date: 04/30/2017 CLINICAL DATA:  Abdominal pain.  Acute pancreatitis. EXAM: MRI ABDOMEN WITHOUT AND WITH CONTRAST (INCLUDING MRCP) TECHNIQUE: Multiplanar multisequence MR imaging of the abdomen was performed both before and after the administration of intravenous contrast. Heavily T2-weighted images of the biliary and pancreatic ducts were obtained, and three-dimensional MRCP images were rendered by post processing. CONTRAST:  20 mL MULTIHANCE GADOBENATE DIMEGLUMINE 529 MG/ML IV SOLN COMPARISON:  CT on 08/24/2016 FINDINGS: Lower  chest: No acute findings. Hepatobiliary: Mild diffuse hepatic steatosis. No liver masses identified. Prior cholecystectomy noted. Mild diffuse biliary ductal dilatation seen with proximal common bile duct measuring 13 mm. No evidence of choledocholithiasis or biliary stricture. Pancreas: No evidence of pancreatic ductal dilatation or pancreas divisum. Beaded appearance of the main pancreatic duct is seen as well as pancreatic atrophy, suspicious for chronic pancreatitis. No evidence of acute peripancreatic inflammatory changes or fluid collections. Spleen:  Within normal limits in size and appearance. Adrenals/Urinary Tract: No masses identified. No evidence of hydronephrosis. Stomach/Bowel: Visualized portions within the abdomen are unremarkable. Vascular/Lymphatic: Several small lymph nodes are seen in the porta hepatis and portacaval space, largest measuring 1.5 cm, likely reactive in etiology. No abdominal aortic aneurysm. Retroaortic left renal vein incidentally noted. Other:  None. Musculoskeletal:  No suspicious bone lesions identified. IMPRESSION: Prior cholecystectomy. Mild diffuse biliary ductal dilatation, with common bile duct measuring 13 mm. No radiographic evidence of choledocholithiasis or other obstructing etiology. No radiographic evidence of acute pancreatitis or pancreatic mass. Beaded appearance of main pancreatic duct and diffuse pancreatic atrophy are suggestive of chronic pancreatitis. Mild hepatic steatosis. Electronically Signed   By: Myles Rosenthal M.D.   On: 04/30/2017 07:57    Time Spent in minutes  25   Eddie North M.D on 05/02/2017 at 11:46 AM  Between 7am to 7pm - Pager - [phone]  After 7pm go to [email or web address] - password [facility]  [facility] -  Office  [phone]

## 2017-05-02 NOTE — Care Management Important Message (Signed)
Important Message  Patient Details  Name: Alex Hoffman MRN: [id number] Date of Birth: 04/14/1961   Medicare Important Message Given:  Yes    Sunshine Mackowski 05/02/2017, 12:53 PM 

## 2017-05-02 NOTE — Care Management Important Message (Signed)
Important Message  Patient Details  Name: Rodell PernaDavid L Wandersee MRN: [id number] Date of Birth: 05/31/1961   Medicare Important Message Given:  Yes    Marcellius Montagna Stefan ChurchBratton 05/02/2017, 12:53 PM

## 2017-05-03 DIAGNOSIS — K219 Gastro-esophageal reflux disease without esophagitis: Secondary | ICD-10-CM

## 2017-05-03 LAB — COMPREHENSIVE METABOLIC PANEL
ALK PHOS: 180 U/L — AB (ref 38–126)
ALT: 71 U/L — ABNORMAL HIGH (ref 17–63)
AST: 71 U/L — AB (ref 15–41)
Albumin: 2.6 g/dL — ABNORMAL LOW (ref 3.5–5.0)
Anion gap: 9 (ref 5–15)
BILIRUBIN TOTAL: 2.4 mg/dL — AB (ref 0.3–1.2)
BUN: 5 mg/dL — AB (ref 6–20)
CALCIUM: 8.8 mg/dL — AB (ref 8.9–10.3)
CO2: 24 mmol/L (ref 22–32)
Chloride: 104 mmol/L (ref 101–111)
Creatinine, Ser: 0.73 mg/dL (ref 0.61–1.24)
GFR calc Af Amer: >60 mL/min (ref >60)
GLUCOSE: 102 mg/dL — AB (ref 65–99)
POTASSIUM: 3.6 mmol/L (ref 3.5–5.1)
Sodium: 137 mmol/L (ref 135–145)
TOTAL PROTEIN: 6.4 g/dL — AB (ref 6.5–8.1)

## 2017-05-03 LAB — CULTURE, BLOOD (ROUTINE X 2)
Culture: NO GROWTH
Culture: NO GROWTH
SPECIAL REQUESTS: ADEQUATE
Special Requests: ADEQUATE

## 2017-05-03 MED ORDER — GUAIFENESIN-DM 100-10 MG/5ML PO SYRP: 5.0000 mL | ORAL_SOLUTION | ORAL | 0 refills | Status: AC | PRN

## 2017-05-03 MED ORDER — ADULT MULTIVITAMIN W/MINERALS CH: 1.0000 | ORAL_TABLET | Freq: Every day | ORAL | 0 refills | Status: AC

## 2017-05-03 MED ORDER — HYDROCODONE-ACETAMINOPHEN 5-325 MG PO TABS: 1.0000 | ORAL_TABLET | Freq: Three times a day (TID) | ORAL | 0 refills | Status: AC | PRN

## 2017-05-03 MED ORDER — FOLIC ACID 1 MG PO TABS: 1.0000 mg | ORAL_TABLET | Freq: Every day | ORAL | 0 refills | Status: AC

## 2017-05-03 MED ORDER — THIAMINE HCL 100 MG PO TABS: 100.0000 mg | ORAL_TABLET | Freq: Every day | ORAL | 0 refills | Status: AC

## 2017-05-03 MED ORDER — NICOTINE 21 MG/24HR TD PT24: 21.0000 mg | MEDICATED_PATCH | Freq: Every day | TRANSDERMAL | 0 refills | Status: AC

## 2017-05-03 NOTE — Progress Notes (Signed)
Pt has orders to be discharged. Discharge instructions given and pt has no additional questions at this time. Medication regimen reviewed and pt educated. Pt verbalized understanding and has no additional questions. Telemetry box removed. IV removed and site in good condition. Pt stable and waiting for transportation. 

## 2017-05-04 NOTE — Discharge Summary (Signed)
Physician Discharge Summary  Alex Hoffman:[id number] DOB: [birth year] DOA: 04/28/2017  PCP: Patient, No Pcp Per  Admit date: 04/28/2017 Discharge date: 05/04/2017  Admitted From: Home.  Disposition:  Home.     Recommendations for Outpatient Follow-up:  1. Follow up with PCP in 1-2 weeks 2. Please obtain BMP/CBC in one week 3. Please follow up with GI IN 1 to 2 weeks and get liver function panel     Discharge Condition:stable.  CODE STATUS:full code.  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 In the [facility] ED labs showed transaminitis with AST and ALT in 200s, lipase of 3100, total bilirubin of 12 and white count of 21K. workup including CT and ultrasound of the abdomen was nonacute with fatty changes of the liver. No CBD stone. GI consulted and patient transferred to [facility]. During his hospitalization, his liver function tests are improving and his symptoms of nausea, vomtiing and abdominal pain have resolved.   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  NO MORE NARCOTICS FROM [facility]   Pancreatitis, acute   Smoking   Jaundice  Acute recurrent pancreatitis Suspect alcoholic pancreatitis. Has had cholecystectomy for prior gallstone  pancreatitis. -Tolerating regular diet without any symptoms.  LFTs continue to improve.. - pain control with vicodin. -MRCP shows dilated CBD without evidence of pancreatitis or obstruction. No signs of cholangitis, antibiotics discontinued. - discharged home to follow up with Gi and get liver function panel.   [facility] consult appreciated. Patient was apparently fired from [employer] for failing pain contract.   Acute transaminitis with severe jaundice Suspect distal CBD obstruction from acute pancreatitis. Alcoholic hepatitis is another possibility. LFTs continue to improve. MRCP without CBD obstruction or active pancreatitis or necrosis.  Factor V lesion deficiency with blood clots, on chronic anticoagulation Continue Xarelto.  Chronic pain with narcotic seeking behavior Resume home medications.    Essential hypertension Well controlled.  Continue amlodipine.  Ongoing smoking and alcohol use Counseled strongly on cessation. Nicotine patch applied. No signs of alcohol withdrawal.  ? peripheral vascular disease with right foot wound Seen by wound care. No signs of infection or discharge. Recommend clean superficial dressing.   Discharge Instructions  Discharge Instructions    Diet - low sodium heart healthy    Complete by:  As directed    Discharge instructions    Complete by:  As directed    Please follow up with PCP in one week and recheck your liver function tests to make sure they have normalized.     Allergies as of 05/03/2017      Reactions   Reglan [metoclopramide] Other (See Comments)   Muscle twitches.      Medication List    STOP taking these medications   DEXILANT 60 MG capsule Generic drug:  dexlansoprazole   oxycodone 30 MG immediate release tablet Commonly  known as:  ROXICODONE     TAKE these medications   ALPRAZolam 0.25 MG tablet Commonly known as:  XANAX Take 0.25 mg by mouth 2 (two) times daily as needed for anxiety.    amLODipine 10 MG tablet Commonly known as:  NORVASC Take 10 mg by mouth daily.   folic acid 1 MG tablet Commonly known as:  FOLVITE Take 1 tablet (1 mg total) by mouth daily.   furosemide 40 MG tablet Commonly known as:  LASIX Take 40 mg by mouth daily as needed for fluid.   guaiFENesin-dextromethorphan 100-10 MG/5ML syrup Commonly known as:  ROBITUSSIN DM Take 5 mLs by mouth every 4 (four) hours as needed for cough.   HYDROcodone-acetaminophen 5-325 MG tablet Commonly known as:  NORCO/VICODIN Take 1-2 tablets by mouth every 8 (eight) hours as needed for moderate pain.   multivitamin with minerals Tabs tablet Take 1 tablet by mouth daily.   nicotine 21 mg/24hr patch Commonly known as:  NICODERM CQ - dosed in mg/24 hours Place 1 patch (21 mg total) onto the skin daily.   pantoprazole 40 MG tablet Commonly known as:  PROTONIX Take 1 tablet (40 mg total) by mouth daily.   rivaroxaban 20 MG Tabs tablet Commonly known as:  XARELTO Take 20 mg by mouth daily.   thiamine 100 MG tablet Take 1 tablet (100 mg total) by mouth daily.      Follow-up Information    Primary Care Physician. Schedule an appointment as soon as possible for a visit in 1 week(s).   Why:  post hospitalization visit and recheck liver function tests.         Allergies  Allergen Reactions  . Reglan [Metoclopramide] Other (See Comments)    Muscle twitches.    Consultations:  GI    Procedures/Studies: Mr 3d Recon At Scanner  Result Date: 04/30/2017 CLINICAL DATA:  Abdominal pain.  Acute pancreatitis. EXAM: MRI ABDOMEN WITHOUT AND WITH CONTRAST (INCLUDING MRCP) TECHNIQUE: Multiplanar multisequence MR imaging of the abdomen was performed both before and after the administration of intravenous contrast. Heavily T2-weighted images of the biliary and pancreatic ducts were obtained, and three-dimensional MRCP images were rendered by post processing. CONTRAST:  20 mL MULTIHANCE GADOBENATE DIMEGLUMINE 529  MG/ML IV SOLN COMPARISON:  CT on 08/24/2016 FINDINGS: Lower chest: No acute findings. Hepatobiliary: Mild diffuse hepatic steatosis. No liver masses identified. Prior cholecystectomy noted. Mild diffuse biliary ductal dilatation seen with proximal common bile duct measuring 13 mm. No evidence of choledocholithiasis or biliary stricture. Pancreas: No evidence of pancreatic ductal dilatation or pancreas divisum. Beaded appearance of the main pancreatic duct is seen as well as pancreatic atrophy, suspicious for chronic pancreatitis. No evidence of acute peripancreatic inflammatory changes or fluid collections. Spleen:  Within normal limits in size and appearance. Adrenals/Urinary Tract: No masses identified. No evidence of hydronephrosis. Stomach/Bowel: Visualized portions within the abdomen are unremarkable. Vascular/Lymphatic: Several small lymph nodes are seen in the porta hepatis and portacaval space, largest measuring 1.5 cm, likely reactive in etiology. No abdominal aortic aneurysm. Retroaortic left renal vein incidentally noted. Other:  None. Musculoskeletal:  No suspicious bone lesions identified. IMPRESSION: Prior cholecystectomy. Mild diffuse biliary ductal dilatation, with common bile duct measuring 13 mm. No radiographic evidence of choledocholithiasis or other obstructing etiology. No radiographic evidence of acute pancreatitis or pancreatic mass. Beaded appearance of main pancreatic duct and diffuse pancreatic atrophy are suggestive of chronic pancreatitis. Mild hepatic steatosis. Electronically Signed   By: Myles Rosenthal M.D.   On:  04/30/2017 07:57   Dg Chest Port 1 View  Result Date: 04/28/2017 CLINICAL DATA:  Shortness of breath.  Abdominal pain EXAM: PORTABLE CHEST 1 VIEW COMPARISON:  08/24/2016 FINDINGS: Cardiomegaly with normal pulmonary vascularity. Mild bibasilar subsegmental atelectasis. Mild right base infiltrate cannot be excluded. Small right pleural effusion. No pneumothorax. IMPRESSION: 1.  Mild bibasilar atelectasis. Mild right base infiltrate cannot be excluded. Small right pleural effusion. 2. Cardiomegaly.  No pulmonary venous congestion . Electronically Signed   By: Maisie Fus  Register   On: 04/28/2017 11:28   Mr Abdomen Mrcp W Wo Contast  Result Date: 04/30/2017 CLINICAL DATA:  Abdominal pain.  Acute pancreatitis. EXAM: MRI ABDOMEN WITHOUT AND WITH CONTRAST (INCLUDING MRCP) TECHNIQUE: Multiplanar multisequence MR imaging of the abdomen was performed both before and after the administration of intravenous contrast. Heavily T2-weighted images of the biliary and pancreatic ducts were obtained, and three-dimensional MRCP images were rendered by post processing. CONTRAST:  20 mL MULTIHANCE GADOBENATE DIMEGLUMINE 529 MG/ML IV SOLN COMPARISON:  CT on 08/24/2016 FINDINGS: Lower chest: No acute findings. Hepatobiliary: Mild diffuse hepatic steatosis. No liver masses identified. Prior cholecystectomy noted. Mild diffuse biliary ductal dilatation seen with proximal common bile duct measuring 13 mm. No evidence of choledocholithiasis or biliary stricture. Pancreas: No evidence of pancreatic ductal dilatation or pancreas divisum. Beaded appearance of the main pancreatic duct is seen as well as pancreatic atrophy, suspicious for chronic pancreatitis. No evidence of acute peripancreatic inflammatory changes or fluid collections. Spleen:  Within normal limits in size and appearance. Adrenals/Urinary Tract: No masses identified. No evidence of hydronephrosis. Stomach/Bowel: Visualized portions within the abdomen are unremarkable. Vascular/Lymphatic: Several small lymph nodes are seen in the porta hepatis and portacaval space, largest measuring 1.5 cm, likely reactive in etiology. No abdominal aortic aneurysm. Retroaortic left renal vein incidentally noted. Other:  None. Musculoskeletal:  No suspicious bone lesions identified. IMPRESSION: Prior cholecystectomy. Mild diffuse biliary ductal dilatation, with common  bile duct measuring 13 mm. No radiographic evidence of choledocholithiasis or other obstructing etiology. No radiographic evidence of acute pancreatitis or pancreatic mass. Beaded appearance of main pancreatic duct and diffuse pancreatic atrophy are suggestive of chronic pancreatitis. Mild hepatic steatosis. Electronically Signed   By: Myles Rosenthal M.D.   On: 04/30/2017 07:57       Subjective:  No chest pain or sob.  No nausea, or vomiting. No abdominal pain.  No headache or dizziness.   Discharge Exam: Vitals:   05/02/17 2012 05/03/17 0336  BP: 120/71 123/71  Pulse: (!) 57 (!) 59  Resp: 18 18  Temp: 97.9 F (36.6 C) 97.8 F (36.6 C)   Vitals:   05/02/17 0544 05/02/17 1212 05/02/17 2012 05/03/17 0336  BP: 138/71 134/77 120/71 123/71  Pulse: (!) 55 61 (!) 57 (!) 59  Resp: 18 18 18 18   Temp: 97.5 F (36.4 C) 97.9 F (36.6 C) 97.9 F (36.6 C) 97.8 F (36.6 C)  TempSrc: Oral Oral Oral Oral  SpO2: 94% 91% 92% 92%  Weight: 91.3 kg (201 lb 3.2 oz)   90.1 kg (198 lb 9.6 oz)  Height:        General: Pt is alert, awake, not in acute distress Cardiovascular: RRR, S1/S2 +, no rubs, no gallops Respiratory: CTA bilaterally, no wheezing, no rhonchi Abdominal: Soft, NT, ND, bowel sounds + Extremities: no edema, no cyanosis    The results of significant diagnostics from this hospitalization (including imaging, microbiology, ancillary and laboratory) are listed below for reference.     Microbiology: Recent  Results (from the past 240 hour(s))  MRSA PCR Screening     Status: None   Collection Time: 04/28/17  9:15 AM  Result Value Ref Range Status   MRSA by PCR NEGATIVE NEGATIVE Final    Comment:        The GeneXpert MRSA Assay (FDA approved for NASAL specimens only), is one component of a comprehensive MRSA colonization surveillance program. It is not intended to diagnose MRSA infection nor to guide or monitor treatment for MRSA infections.   Culture, blood (routine x 2)      Status: None   Collection Time: 04/28/17  1:20 PM  Result Value Ref Range Status   Specimen Description BLOOD LEFT ARM  Final   Special Requests IN PEDIATRIC BOTTLE Blood Culture adequate volume  Final   Culture NO GROWTH 5 DAYS  Final   Report Status 05/03/2017 FINAL  Final  Culture, blood (routine x 2)     Status: None   Collection Time: 04/28/17  1:20 PM  Result Value Ref Range Status   Specimen Description BLOOD RIGHT HAND  Final   Special Requests IN PEDIATRIC BOTTLE Blood Culture adequate volume  Final   Culture NO GROWTH 5 DAYS  Final   Report Status 05/03/2017 FINAL  Final     Labs: BNP (last 3 results) No results for input(s): BNP in the last 8760 hours. Basic Metabolic Panel:  Recent Labs Lab 04/29/17 0255 04/30/17 0247 04/30/17 0730 05/01/17 0431 05/02/17 0335 05/03/17 0300  NA 135 136  --  135 137 137  K 3.6 3.4*  --  3.6 3.8 3.6  CL 104 101  --  103 106 104  CO2 23 24  --  26 23 24   GLUCOSE 56* 47*  --  94 93 102*  BUN 7 7  --  <5* <5* 5*  CREATININE 0.86 0.75  --  0.91 0.70 0.73  CALCIUM 8.4* 8.5*  --  8.8* 8.9 8.8*  MG  --   --  1.8  --   --   --    Liver Function Tests:  Recent Labs Lab 04/29/17 0255 04/30/17 0247 05/01/17 0431 05/02/17 0335 05/03/17 0300  AST 91* 88* 87* 83* 71*  ALT 83* 76* 73* 73* 71*  ALKPHOS 198* 190* 187* 173* 180*  BILITOT 7.2* 5.8* 4.9* 3.2* 2.4*  PROT 5.7* 6.0* 6.3* 6.2* 6.4*  ALBUMIN 2.5* 2.3* 2.5* 2.4* 2.6*    Recent Labs Lab 04/29/17 1000  LIPASE 82*   No results for input(s): AMMONIA in the last 168 hours. CBC:  Recent Labs Lab 04/28/17 1320 04/29/17 0255 04/30/17 0247 05/01/17 0431  WBC 12.1* 10.3 7.2 6.6  HGB 12.2* 11.8* 11.9* 12.9*  HCT 36.6* 35.7* 36.0* 38.1*  MCV 91.0 90.8 90.5 89.9  PLT 260 274 312 290   Cardiac Enzymes: No results for input(s): CKTOTAL, CKMB, CKMBINDEX, TROPONINI in the last 168 hours. BNP: Invalid input(s): POCBNP CBG: No results for input(s): GLUCAP in the last  168 hours. D-Dimer No results for input(s): DDIMER in the last 72 hours. Hgb A1c No results for input(s): HGBA1C in the last 72 hours. Lipid Profile No results for input(s): CHOL, HDL, LDLCALC, TRIG, CHOLHDL, LDLDIRECT in the last 72 hours. Thyroid function studies No results for input(s): TSH, T4TOTAL, T3FREE, THYROIDAB in the last 72 hours.  Invalid input(s): FREET3 Anemia work up No results for input(s): VITAMINB12, FOLATE, FERRITIN, TIBC, IRON, RETICCTPCT in the last 72 hours. Urinalysis    Component Value Date/Time  COLORURINE YELLOW 01/28/2014 1504   APPEARANCEUR CLEAR 01/28/2014 1504   LABSPEC >1.030 (H) 01/28/2014 1504   PHURINE 5.5 01/28/2014 1504   GLUCOSEU NEGATIVE 01/28/2014 1504   HGBUR NEGATIVE 01/28/2014 1504   BILIRUBINUR NEGATIVE 01/28/2014 1504   KETONESUR NEGATIVE 01/28/2014 1504   PROTEINUR NEGATIVE 01/28/2014 1504   UROBILINOGEN 0.2 01/28/2014 1504   NITRITE NEGATIVE 01/28/2014 1504   LEUKOCYTESUR NEGATIVE 01/28/2014 1504   Sepsis Labs Invalid input(s): PROCALCITONIN,  WBC,  LACTICIDVEN Microbiology Recent Results (from the past 240 hour(s))  MRSA PCR Screening     Status: None   Collection Time: 04/28/17  9:15 AM  Result Value Ref Range Status   MRSA by PCR NEGATIVE NEGATIVE Final    Comment:        The GeneXpert MRSA Assay (FDA approved for NASAL specimens only), is one component of a comprehensive MRSA colonization surveillance program. It is not intended to diagnose MRSA infection nor to guide or monitor treatment for MRSA infections.   Culture, blood (routine x 2)     Status: None   Collection Time: 04/28/17  1:20 PM  Result Value Ref Range Status   Specimen Description BLOOD LEFT ARM  Final   Special Requests IN PEDIATRIC BOTTLE Blood Culture adequate volume  Final   Culture NO GROWTH 5 DAYS  Final   Report Status 05/03/2017 FINAL  Final  Culture, blood (routine x 2)     Status: None   Collection Time: 04/28/17  1:20 PM  Result  Value Ref Range Status   Specimen Description BLOOD RIGHT HAND  Final   Special Requests IN PEDIATRIC BOTTLE Blood Culture adequate volume  Final   Culture NO GROWTH 5 DAYS  Final   Report Status 05/03/2017 FINAL  Final     Time coordinating discharge: Over 30 minutes  SIGNED:   Kathlen ModyAKULA,Madgie Dhaliwal, MD  [facility] 05/04/2017, 8:31 AM Pager   If 7PM-7AM, please contact night-coverage [email or web address] Password [email or web address]

## 2020-02-29 DEATH — deceased
